# Patient Record
Sex: Male | Born: 1950 | Race: White | Hispanic: No | Marital: Married | Smoking: Never smoker
Health system: Southern US, Community
[De-identification: ages and names within clinical notes are randomized; demographics above are authoritative.]

## PROBLEM LIST (undated history)

## (undated) DIAGNOSIS — K8020 Calculus of gallbladder without cholecystitis without obstruction: Secondary | ICD-10-CM

## (undated) DIAGNOSIS — G4733 Obstructive sleep apnea (adult) (pediatric): Secondary | ICD-10-CM

## (undated) DIAGNOSIS — M1990 Unspecified osteoarthritis, unspecified site: Secondary | ICD-10-CM

## (undated) DIAGNOSIS — F419 Anxiety disorder, unspecified: Secondary | ICD-10-CM

## (undated) DIAGNOSIS — I10 Essential (primary) hypertension: Secondary | ICD-10-CM

## (undated) DIAGNOSIS — R011 Cardiac murmur, unspecified: Secondary | ICD-10-CM

## (undated) DIAGNOSIS — G4700 Insomnia, unspecified: Secondary | ICD-10-CM

## (undated) DIAGNOSIS — R519 Headache, unspecified: Secondary | ICD-10-CM

## (undated) DIAGNOSIS — F32A Depression, unspecified: Secondary | ICD-10-CM

## (undated) DIAGNOSIS — K219 Gastro-esophageal reflux disease without esophagitis: Secondary | ICD-10-CM

## (undated) DIAGNOSIS — Z9889 Other specified postprocedural states: Secondary | ICD-10-CM

## (undated) HISTORY — DX: Gastro-esophageal reflux disease without esophagitis: K21.9

## (undated) HISTORY — PX: RETINAL TEAR REPAIR CRYOTHERAPY: SHX5304

## (undated) HISTORY — DX: Unspecified osteoarthritis, unspecified site: M19.90

## (undated) HISTORY — DX: Essential (primary) hypertension: I10

## (undated) HISTORY — DX: Anxiety disorder, unspecified: F41.9

## (undated) HISTORY — DX: Cardiac murmur, unspecified: R01.1

## (undated) HISTORY — DX: Obstructive sleep apnea (adult) (pediatric): G47.33

## (undated) HISTORY — DX: Insomnia, unspecified: G47.00

## (undated) HISTORY — DX: Depression, unspecified: F32.A

---
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
## 2003-09-03 ENCOUNTER — Ambulatory Visit (HOSPITAL_BASED_OUTPATIENT_CLINIC_OR_DEPARTMENT_OTHER): Admission: RE | Admit: 2003-09-03 | Discharge: 2003-09-03 | Payer: Self-pay | Admitting: Family Medicine

## 2004-05-26 ENCOUNTER — Ambulatory Visit (HOSPITAL_BASED_OUTPATIENT_CLINIC_OR_DEPARTMENT_OTHER): Admission: RE | Admit: 2004-05-26 | Discharge: 2004-05-26 | Payer: Self-pay | Admitting: Pulmonary Disease

## 2005-12-05 ENCOUNTER — Ambulatory Visit: Payer: Self-pay | Admitting: Family Medicine

## 2005-12-12 ENCOUNTER — Ambulatory Visit: Payer: Self-pay | Admitting: Family Medicine

## 2006-08-30 ENCOUNTER — Ambulatory Visit: Payer: Self-pay | Admitting: Family Medicine

## 2006-12-18 ENCOUNTER — Ambulatory Visit: Payer: Self-pay | Admitting: Family Medicine

## 2006-12-25 ENCOUNTER — Ambulatory Visit: Payer: Self-pay | Admitting: Gastroenterology

## 2007-01-07 ENCOUNTER — Ambulatory Visit: Payer: Self-pay | Admitting: Gastroenterology

## 2007-02-11 ENCOUNTER — Ambulatory Visit: Payer: Self-pay | Admitting: Family Medicine

## 2008-02-11 ENCOUNTER — Ambulatory Visit: Payer: Self-pay | Admitting: Family Medicine

## 2008-02-18 ENCOUNTER — Ambulatory Visit: Payer: Self-pay | Admitting: Family Medicine

## 2008-02-18 DIAGNOSIS — K219 Gastro-esophageal reflux disease without esophagitis: Secondary | ICD-10-CM | POA: Insufficient documentation

## 2008-02-18 DIAGNOSIS — M129 Arthropathy, unspecified: Secondary | ICD-10-CM

## 2008-02-18 DIAGNOSIS — I10 Essential (primary) hypertension: Secondary | ICD-10-CM

## 2008-02-18 DIAGNOSIS — G4733 Obstructive sleep apnea (adult) (pediatric): Secondary | ICD-10-CM

## 2008-02-18 LAB — CONVERTED CEMR LAB
ALT: 43 units/L (ref 0–53)
AST: 28 units/L (ref 0–37)
Basophils Absolute: 0 10*3/uL (ref 0.0–0.1)
Basophils Relative: 0.2 % (ref 0.0–1.0)
Bilirubin, Direct: 0.1 mg/dL (ref 0.0–0.3)
CO2: 30 meq/L (ref 19–32)
Chloride: 105 meq/L (ref 96–112)
Cholesterol: 152 mg/dL (ref 0–200)
Creatinine, Ser: 0.9 mg/dL (ref 0.4–1.5)
Glucose, Bld: 118 mg/dL — ABNORMAL HIGH (ref 70–99)
Hemoglobin: 16.4 g/dL (ref 13.0–17.0)
LDL Cholesterol: 100 mg/dL — ABNORMAL HIGH (ref 0–99)
Lymphocytes Relative: 20.9 % (ref 12.0–46.0)
MCHC: 34.7 g/dL (ref 30.0–36.0)
Monocytes Relative: 9.7 % (ref 3.0–12.0)
Neutro Abs: 3.7 10*3/uL (ref 1.4–7.7)
Neutrophils Relative %: 67.1 % (ref 43.0–77.0)
RBC: 5.37 M/uL (ref 4.22–5.81)
Sodium: 142 meq/L (ref 135–145)
Total Bilirubin: 1 mg/dL (ref 0.3–1.2)
Total CHOL/HDL Ratio: 5.4
Total Protein: 6.7 g/dL (ref 6.0–8.3)
VLDL: 24 mg/dL (ref 0–40)
WBC: 5.4 10*3/uL (ref 4.5–10.5)

## 2008-03-30 ENCOUNTER — Ambulatory Visit: Payer: Self-pay | Admitting: Family Medicine

## 2008-03-30 DIAGNOSIS — F411 Generalized anxiety disorder: Secondary | ICD-10-CM

## 2008-05-12 ENCOUNTER — Ambulatory Visit: Payer: Self-pay | Admitting: Family Medicine

## 2008-05-26 ENCOUNTER — Telehealth (INDEPENDENT_AMBULATORY_CARE_PROVIDER_SITE_OTHER): Payer: Self-pay | Admitting: *Deleted

## 2008-06-03 ENCOUNTER — Encounter: Payer: Self-pay | Admitting: Family Medicine

## 2008-06-21 ENCOUNTER — Telehealth: Payer: Self-pay | Admitting: Family Medicine

## 2008-07-05 ENCOUNTER — Ambulatory Visit: Payer: Self-pay | Admitting: Family Medicine

## 2008-07-05 DIAGNOSIS — J1189 Influenza due to unidentified influenza virus with other manifestations: Secondary | ICD-10-CM | POA: Insufficient documentation

## 2008-07-19 ENCOUNTER — Ambulatory Visit: Payer: Self-pay | Admitting: Gastroenterology

## 2008-08-06 ENCOUNTER — Ambulatory Visit: Payer: Self-pay | Admitting: Gastroenterology

## 2008-08-06 ENCOUNTER — Encounter: Payer: Self-pay | Admitting: Gastroenterology

## 2008-08-06 LAB — HM COLONOSCOPY

## 2008-08-09 ENCOUNTER — Encounter: Payer: Self-pay | Admitting: Gastroenterology

## 2008-08-10 ENCOUNTER — Ambulatory Visit: Payer: Self-pay | Admitting: Family Medicine

## 2008-09-05 ENCOUNTER — Encounter: Payer: Self-pay | Admitting: Family Medicine

## 2008-09-07 ENCOUNTER — Encounter: Payer: Self-pay | Admitting: Family Medicine

## 2009-02-22 ENCOUNTER — Ambulatory Visit: Payer: Self-pay | Admitting: Family Medicine

## 2009-02-22 LAB — CONVERTED CEMR LAB
Blood in Urine, dipstick: NEGATIVE
Ketones, urine, test strip: NEGATIVE
Nitrite: NEGATIVE
Urobilinogen, UA: 1
WBC Urine, dipstick: NEGATIVE

## 2009-03-09 ENCOUNTER — Ambulatory Visit: Payer: Self-pay | Admitting: Family Medicine

## 2009-03-09 DIAGNOSIS — G4700 Insomnia, unspecified: Secondary | ICD-10-CM

## 2009-03-09 LAB — CONVERTED CEMR LAB
Alkaline Phosphatase: 48 units/L (ref 39–117)
Basophils Relative: 0.2 % (ref 0.0–3.0)
Bilirubin, Direct: 0 mg/dL (ref 0.0–0.3)
Calcium: 9.7 mg/dL (ref 8.4–10.5)
Creatinine, Ser: 0.9 mg/dL (ref 0.4–1.5)
Eosinophils Absolute: 0.1 10*3/uL (ref 0.0–0.7)
Eosinophils Relative: 2.7 % (ref 0.0–5.0)
GFR calc non Af Amer: 92.38 mL/min (ref >60)
HDL: 27.3 mg/dL — ABNORMAL LOW (ref >39.00)
LDL Cholesterol: 102 mg/dL — ABNORMAL HIGH (ref 0–99)
Lymphocytes Relative: 26.9 % (ref 12.0–46.0)
MCHC: 34.8 g/dL (ref 30.0–36.0)
Monocytes Relative: 11.9 % (ref 3.0–12.0)
Neutrophils Relative %: 58.3 % (ref 43.0–77.0)
RBC: 5.09 M/uL (ref 4.22–5.81)
Total CHOL/HDL Ratio: 6
Total Protein: 6.8 g/dL (ref 6.0–8.3)
Triglycerides: 149 mg/dL (ref 0.0–149.0)
VLDL: 29.8 mg/dL (ref 0.0–40.0)
WBC: 5 10*3/uL (ref 4.5–10.5)

## 2009-05-22 ENCOUNTER — Encounter: Payer: Self-pay | Admitting: Family Medicine

## 2009-05-24 ENCOUNTER — Encounter: Payer: Self-pay | Admitting: Family Medicine

## 2010-02-24 LAB — HM DIABETES EYE EXAM

## 2010-03-29 ENCOUNTER — Ambulatory Visit: Payer: Self-pay | Admitting: Family Medicine

## 2010-04-05 ENCOUNTER — Ambulatory Visit: Payer: Self-pay | Admitting: Family Medicine

## 2010-04-05 DIAGNOSIS — E119 Type 2 diabetes mellitus without complications: Secondary | ICD-10-CM

## 2010-04-05 DIAGNOSIS — T50995A Adverse effect of other drugs, medicaments and biological substances, initial encounter: Secondary | ICD-10-CM

## 2010-04-05 LAB — CONVERTED CEMR LAB
ALT: 51 units/L (ref 0–53)
AST: 31 units/L (ref 0–37)
BUN: 18 mg/dL (ref 6–23)
Bilirubin, Direct: 0.2 mg/dL (ref 0.0–0.3)
Blood in Urine, dipstick: NEGATIVE
Cholesterol: 174 mg/dL (ref 0–200)
Creatinine, Ser: 1 mg/dL (ref 0.4–1.5)
Eosinophils Relative: 2.5 % (ref 0.0–5.0)
GFR calc non Af Amer: 81.49 mL/min (ref >60)
HDL: 27.3 mg/dL — ABNORMAL LOW (ref >39.00)
Hgb A1c MFr Bld: 12.8 % — ABNORMAL HIGH (ref 4.6–6.5)
Monocytes Relative: 10 % (ref 3.0–12.0)
Neutrophils Relative %: 61.5 % (ref 43.0–77.0)
PSA: 0.4 ng/mL (ref 0.10–4.00)
Platelets: 162 10*3/uL (ref 150.0–400.0)
Protein, U semiquant: NEGATIVE
Total Bilirubin: 0.9 mg/dL (ref 0.3–1.2)
VLDL: 56.8 mg/dL — ABNORMAL HIGH (ref 0.0–40.0)
WBC Urine, dipstick: NEGATIVE
WBC: 4.6 10*3/uL (ref 4.5–10.5)
pH: 5.5

## 2010-04-06 ENCOUNTER — Telehealth: Payer: Self-pay | Admitting: Family Medicine

## 2010-04-12 ENCOUNTER — Telehealth: Payer: Self-pay | Admitting: Family Medicine

## 2010-04-18 ENCOUNTER — Telehealth: Payer: Self-pay | Admitting: Family Medicine

## 2010-04-25 ENCOUNTER — Telehealth (INDEPENDENT_AMBULATORY_CARE_PROVIDER_SITE_OTHER): Payer: Self-pay | Admitting: *Deleted

## 2010-05-17 ENCOUNTER — Encounter
Admission: RE | Admit: 2010-05-17 | Discharge: 2010-08-15 | Payer: Self-pay | Source: Home / Self Care | Attending: Family Medicine | Admitting: Family Medicine

## 2010-05-17 ENCOUNTER — Encounter: Payer: Self-pay | Admitting: Family Medicine

## 2010-06-27 ENCOUNTER — Telehealth: Payer: Self-pay | Admitting: Family Medicine

## 2010-07-12 ENCOUNTER — Encounter: Payer: Self-pay | Admitting: Family Medicine

## 2010-08-01 ENCOUNTER — Ambulatory Visit: Payer: Self-pay | Admitting: Family Medicine

## 2010-08-01 LAB — CONVERTED CEMR LAB: Hgb A1c MFr Bld: 5.8 % (ref 4.6–6.5)

## 2010-09-26 NOTE — Progress Notes (Signed)
Summary: BS readings    Phone Note Call from Patient   Caller: Spouse Summary of Call: wife called to report BS aug 17 am  aug 18 am  166 pm 176 aug 19 am 192 pm 113 aug 20 am 139 pm 111 aug 21 am 134 pm 106 aug 22 am 109 pm 109  aug 23 am 175  pls advise  Initial call taken by: Pura Spice, RN,  April 18, 2010 11:02 AM  Follow-up for Phone Call        leave on same meds cont his journal and terri will check n his appt but for now leave meds as is.  Follow-up by: Pura Spice, RN,  April 18, 2010 4:29 PM  Additional Follow-up for Phone Call Additional follow up Details #1::        [facility] Additional Follow-up by: Lynann Beaver CMA,  April 18, 2010 4:50 PM    Additional Follow-up for Phone Call Additional follow up Details #2::    Called & given info.  Appointment DTC is 9-21 per [facility].   Follow-up by: Rudy Jew, RN,  April 19, 2010 1:12 PM

## 2010-09-26 NOTE — Assessment & Plan Note (Signed)
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 With the stress of his job he continues to need alprazolam p.r.n. He is noted he has lost 12 pounds over the past 6 months without being on regular diet, also hasn't had increased thirst note polyuria last night continues to be good he had one episode of positional vertigo dizziness but only on one occasion. Other observation is had some change in his vision over the past month and has been seen bilateral mild to warm he had a beginning cataract in the right eye Colonoscopic exam dated 2014 on arrival today had patient to have had blood glucose which is after a meal and was 4.25  Allergies (verified): No Known Drug Allergies  Past History:  Past Medical History: Last updated: 07/05/2008 GERD Hypertension  Past History:  Care Management: Dermatology: Dr Bertha Stakes Gastroenterology: Dr Russella Dar Ophthalmology: Dr Adele Schilder [facility]   Review of Systems      See HPI General:  See HPI; Denies chills, fatigue, fever, loss of appetite, malaise, sleep disorder, sweats, weakness, and weight loss. Eyes:  Complains of vision loss-both eyes; 2020 with glasses 20/50, early cataract right eye. ENT:  Denies decreased hearing, difficulty swallowing, ear discharge, earache, hoarseness, nasal congestion,  nosebleeds, postnasal drainage, ringing in ears, sinus pressure, and sore throat. CV:  Denies bluish discoloration of lips or nails, chest pain or discomfort, difficulty breathing at night, difficulty breathing while lying down, fainting, fatigue, leg cramps with exertion, lightheadness, near fainting, palpitations, shortness of breath with exertion, swelling of feet, swelling of hands, and weight gain; blood pressure well controlled. Resp:  Denies chest discomfort, chest pain with inspiration, cough, coughing up blood, excessive snoring, hypersomnolence, morning headaches, pleuritic, shortness of breath, sputum productive, and wheezing. GI:  See HPI; severe GERD under control with Prilosec b.i.d.. GU:  Denies decreased libido, discharge, dysuria, erectile dysfunction, genital sores, hematuria, incontinence, nocturia, urinary frequency, and urinary hesitancy. MS:  Complains of joint pain; *pain which is controlled with diclofenac. Derm:  Denies changes in color of skin, changes in nail beds, dryness, excessive perspiration, flushing, hair loss, insect bite(s), itching, lesion(s), poor wound healing, and rash. Neuro:  Denies brief paralysis, difficulty with concentration, disturbances in coordination, falling down, headaches, inability to speak, memory loss, numbness, poor balance, seizures, sensation of room spinning, tingling, tremors, visual disturbances, and weakness. Psych:  Denies alternate hallucination ( auditory/visual), anxiety, depression, easily angered, easily tearful, irritability, mental problems, panic attacks, sense of great danger, suicidal thoughts/plans, thoughts of violence, unusual visions or sounds, and thoughts /plans of harming others.  Physical Exam  General:  Well-developed,well-nourished,in no acute distress; alert,appropriate and cooperative throughout examinationoverweight-appearing.   Head:  Normocephalic and atraumatic without obvious abnormalities. No apparent alopecia or  balding. Eyes:  No corneal or conjunctival inflammation noted. EOMI. Perrla. Funduscopic exam benign, without hemorrhages, exudates or papilledema. Vision grossly normal. Ears:  External ear exam shows no significant lesions or deformities.  Otoscopic examination reveals clear canals, tympanic membranes are intact bilaterally without bulging, retraction, inflammation or discharge. Hearing is grossly normal bilaterally. Nose:  External nasal examination shows no deformity or inflammation. Nasal mucosa are pink and moist without lesions or exudates. Mouth:  Oral mucosa and oropharynx without lesions or exudates.  Teeth in good repair. Neck:  No deformities, masses, or tenderness noted. Chest Wall:  No deformities, masses, tenderness or gynecomastia noted. Breasts:  No masses or gynecomastia noted Lungs:  Normal respiratory effort, chest expands symmetrically. Lungs are clear to auscultation, no crackles or wheezes. Heart:  Normal rate and regular rhythm. S1 and S2 normal without gallop, murmur, click, rub or other extra sounds. Abdomen:  Bowel sounds positive,abdomen soft and non-tender without masses, organomegaly or hernias noted. Rectal:  No external abnormalities noted. Normal sphincter tone. No rectal masses or tenderness. Genitalia:  Testes bilaterally descended without nodularity, tenderness or masses. No scrotal masses or lesions. No penis lesions or urethral discharge. Prostate:  Prostate gland firm and smooth, no enlargement, nodularity, tenderness, mass, asymmetry or induration. Msk:  No deformity or scoliosis noted of thoracic or lumbar spine.   Pulses:  R and L carotid,radial,femoral,dorsalis pedis and posterior tibial pulses are full and equal bilaterally Extremities:  No clubbing, cyanosis, edema, or deformity noted with normal full range of motion of all joints.   Neurologic:  No cranial nerve deficits noted. Station and gait are normal. Plantar reflexes are down-going bilaterally.  DTRs are symmetrical throughout. Sensory, motor and coordinative functions appear intact. Skin:  Intact without suspicious lesions or rashes Cervical Nodes:  No lymphadenopathy noted Axillary Nodes:  No palpable lymphadenopathy Inguinal Nodes:  No significant adenopathy Psych:  Cognition and judgment appear intact. Alert and cooperative with normal attention span and concentration. No apparent delusions, illusions, hallucinations   Impression & Recommendations:  Problem # 1:  DIABETES MELLITUS, TYPE II (ICD-250.00) Assessment New  His updated medication list for this problem includes:    Adult Aspirin Ec Low Strength 81 Mg Tbec (Aspirin) .Marland Kitchen... 1 by mouth once daily    Azor 5-20 Mg Tabs (Amlodipine-olmesartan) .Marland Kitchen... 1 once daily for blood pressure    Glucotrol 10 Mg Tabs (Glipizide) .Marland Kitchen... 1 tab am and pm already taking olemesartin and are Orders: Diabetic Clinic Referral (Diabetic)  Problem # 2:  GERD (ICD-530.81) Assessment: Improved  His updated medication list for this problem includes:    Prilosec Otc 20 Mg Tbec (Omeprazole magnesium) ..... One two times a day for gerd  Problem # 3:  ANXIETY (ICD-300.00) Assessment: Improved  His updated medication list for this problem includes:    Alprazolam 0.25 Mg Tabs (Alprazolam) .Marland Kitchen... 1 morn midafternoon and hs as needed for stress  Problem # 4:  SLEEP APNEA (ICD-780.57) Assessment: Unchanged  Problem # 5:  PHYSICAL EXAMINATION (ICD-V70.0) Assessment: Unchanged  Problem # 6:  HYPERTENSION, BENIGN ESSENTIAL (ICD-401.1) Assessment: Improved  His updated medication list for this problem includes:    Azor 5-20 Mg Tabs (Amlodipine-olmesartan) .Marland Kitchen... 1 once daily for blood pressure  Problem # 7:  ARTHRITIS (ICD-716.90) Assessment: Improved diclofenac 75 mg b.i.d.  Complete Medication List: 1)  Adult Aspirin Ec Low Strength 81 Mg Tbec (Aspirin) .Marland Kitchen.. 1 by mouth once daily 2)  Diclofenac  Sodium 75 Mg Tbec (Diclofenac sodium) .Marland Kitchen.. 1  two times a day  for arthritis 3)  Alprazolam 0.25 Mg Tabs (Alprazolam) .Marland Kitchen.. 1 morn midafternoon and hs as needed for stress 4)  Azor 5-20 Mg Tabs (Amlodipine-olmesartan) .Marland Kitchen.. 1 once daily for blood pressure 5)  Prilosec Otc 20 Mg Tbec (Omeprazole magnesium) .... One two times a day for gerd 6)  Glucotrol 10 Mg Tabs (Glipizide) .Marland Kitchen.. 1 tab am and pm 7)  Onetouch Ultra Test Strp (Glucose blood) .... As directed 8)  Onetouch Ultrasoft Lancets Misc (Lancets) .... As directed 9)  Onetouch Ultra Control Soln (Blood glucose calibration) .... As directed  Other Orders: Venipuncture (44010) Glucose, (CBG) (27253) Fingerstick (66440) Specimen Handling (34742) TLB-A1C / Hgb A1C (Glycohemoglobin) (83036-A1C)  Patient Instructions: 1)  as we discussed you are now a new diabetic2 which we will control with diet exercise as well as medications starting with glipizide 10 mg b.i.d. 2)  Check in your blood glucose each morning and keeping a record 3)  Call on 811 zero first Or blood group goes and then all me in one week to observe your improve 4)  2 schedule you your wife for diabetic classes that home hospital health services 5)  Refilled her medication Prescriptions: ONETOUCH ULTRA CONTROL  SOLN (BLOOD GLUCOSE CALIBRATION) as directed  #1 botle x 5   Entered and Authorized by:   Judithann Sheen MD   Signed by:   Judithann Sheen MD on 04/05/2010   Method used:   Electronically to        [facility]. [address] [phone]* (retail)       [address]       [place], [place]  [address]       Ph: [phone] or [phone]       Fax: [phone]   RxID:   [id number] [facility] ULTRASOFT LANCETS  MISC (LANCETS) as directed  #100 x 11   Entered and Authorized by:   Judithann Sheen MD   Signed by:   Judithann Sheen MD on 04/05/2010   Method used:   Electronically to        [facility]. [address] [phone]* (retail)       [address]       [place], [place]  [address]       Ph: [phone] or [phone]       Fax: [phone]   RxID:   [id number] [facility] ULTRA TEST  STRP (GLUCOSE BLOOD) as directed  #100 x 11   Entered and Authorized by:   Judithann Sheen MD   Signed by:   Judithann Sheen MD on 04/05/2010   Method used:   Electronically to        [facility]. [address] [phone]* (retail)       [address]       [place], [place]  [address]       Ph: [phone] or [phone]       Fax: [phone]   RxID:   [id number] GLUCOTROL 10 MG TABS (GLIPIZIDE) 1 tab AM and PM  #60 x 11   Entered and Authorized by:   Judithann Sheen MD   Signed by:   Judithann Sheen MD on 04/05/2010   Method  used:   Electronically to        [facility]. [address] [phone]* (retail)       [address]       [place], [place]  [address]       Ph: [phone] or [phone]       Fax: [phone]   RxID:   [id number] PRILOSEC OTC 20 MG TBEC (OMEPRAZOLE MAGNESIUM) one two times a day for gerd  #180 x 3   Entered and Authorized by:   Judithann Sheen MD   Signed by:   Judithann Sheen MD on 04/05/2010   Method used:   Electronically to        [facility]. [address] [phone]* (retail)       [address]       [place], [place]  [address]       Ph: [phone] or [phone]       Fax: [phone]   RxID:   [id number] AZOR 5-20 MG  TABS (AMLODIPINE-OLMESARTAN) 1 once daily for blood pressure  #30 x 11   Entered and Authorized by:   Judithann Sheen MD   Signed by:   Judithann Sheen MD on 04/05/2010   Method used:   Electronically to        [facility]. [address] [phone]* (retail)       [address]       [place], [place]  [address]       Ph: [phone] or [phone]       Fax: [phone]   RxID:   [id number] DICLOFENAC SODIUM 75 MG  TBEC (DICLOFENAC SODIUM) 1 two times a day  for arthritis  #60 x 11   Entered and  Authorized by:   Judithann Sheen MD   Signed by:   Judithann Sheen MD on 04/05/2010   Method used:   Electronically to        [facility]. [address] [phone]* (retail)       [address]       [place], [place]  [address]       Ph: [phone] or [phone]       Fax: [phone]   RxID:   [id number]   Laboratory Results   Blood Tests   Date/Time Recieved: April 05, 2010 9:10 AM  Date/Time Reported: April 05, 2010 9:10 AM   CBG Random: 497  Comments: Wynona Canes, [place]  April 05, 2010 9:11 AM

## 2010-09-26 NOTE — Letter (Signed)
Summary: [facility]  [facility]   Imported By: Maryln Gottron 07/17/2010 09:05:06  _____________________________________________________________________  External Attachment:    Type:   Image     Comment:   External Document

## 2010-09-26 NOTE — Progress Notes (Signed)
Summary: bs too high wife thinks  Phone Note Call from Patient Call back at Home Phone [phone]   Caller: vm kay Call For: gina Reason for Call: Talk to Nurse Summary of Call: Question about BS.  May need adjustment.  She doesn't think the BS are low enough.  8-10 pm 185 8-11 am 291 pm 357 8-12 am 297 pm 357 8-13 am 268 pm 235 8-14 am 286 pm 201 8-15 am 338 pm 321 8-16 am 215 pm 203 8-17 am 163.  Glipizide 10mg  one two times a day.  Working on diet appt [facility] next month.  He is not eating any desserts, probably about 7 carbs, diet sodas & water.  Working on exercise, longer walks 3x day with dog.  Is dropping weight, wife says visibly.  [facility]  NKDA. Rudy Jew, RN  April 12, 2010 3:54 PM  Initial call taken by: Rudy Jew, RN,  April 12, 2010 3:14 PM  Follow-up for Phone Call        per dr Alfonzo Feller start on metformin  wife aware and to mointor bs and call on tueday.  Follow-up by: Pura Spice, RN,  April 12, 2010 4:25 PM    New/Updated Medications: METFORMIN HCL 500 MG TABS (METFORMIN HCL) 1 by mouth two times a day Prescriptions: METFORMIN HCL 500 MG TABS (METFORMIN HCL) 1 by mouth two times a day  #60 x 5   Entered by:   Pura Spice, RN   Authorized by:   Judithann Sheen MD   Signed by:   Pura Spice, RN on 04/12/2010   Method used:   Electronically to        [facility]. [address] [phone]* (retail)       [address]       [place], [place]  [address]       Ph: [phone] or [phone]       Fax: [phone]   RxID:   [id number]

## 2010-09-26 NOTE — Letter (Signed)
Summary: [facility]  [facility]   Imported By: Maryln Gottron 05/23/2010 13:59:40  _____________________________________________________________________  External Attachment:    Type:   Image     Comment:   External Document

## 2010-09-26 NOTE — Progress Notes (Signed)
Summary: Omeprazole  Phone Note Outgoing Call   Summary of Call: I called pt to inform him that his insurance is wanting a prior authoriztion for his Omeprazole? I informed pt that he could take this over the counter two times a day. Pt states he will need a new RX wrote so he can use that on his flex spending? Initial call taken by: Josph Macho RMA,  June 27, 2010 1:39 PM  Follow-up for Phone Call        OK can have rx for Omeprazole 20mg  by mouth two times a day as needed reflux, #60 with 2 rf Follow-up by: Danise Edge MD,  June 27, 2010 1:43 PM  Additional Follow-up for Phone Call Additional follow up Details #1::        Pt informed that RX is in the front office. Additional Follow-up by: Josph Macho RMA,  June 27, 2010 1:50 PM    Prescriptions: PRILOSEC OTC 20 MG TBEC (OMEPRAZOLE MAGNESIUM) one two times a day for gerd  #60 x 2   Entered by:   Josph Macho RMA   Authorized by:   Danise Edge MD   Signed by:   Josph Macho RMA on 06/27/2010   Method used:   Print then Give to Patient   RxID:   [id number]

## 2010-09-26 NOTE — Progress Notes (Signed)
Summary: BS reading  Phone Note Call from Patient   Caller: Spouse Call For: Judithann Sheen MD Summary of Call: Wife is calling BSs FBS:  175, 134, 134, 113, 120, 117, 125, 148,  Before dinner, 84, 105, 106, 82, 154, 112, 96 [phone] Leave message. Initial call taken by: Lynann Beaver CMA,  April 25, 2010 9:02 AM  Follow-up for Phone Call        Notify reviewed previous numbers these look improved, continue the dietary and exercise changes previously made. No change in medicaitons at this time. Make sure the pm snack has a lean protein or a healthy fat with it and minimize carbs at that time and it should help the am BS somewhat. Continue with routine labs and appt. Follow-up by: Danise Edge MD,  April 25, 2010 9:07 AM  Additional Follow-up for Phone Call Additional follow up Details #1::        pts spouse informed Additional Follow-up by: Josph Macho RMA,  April 25, 2010 9:44 AM

## 2010-09-26 NOTE — Progress Notes (Signed)
Summary: Pt called and gave his fasting blood glucose reading this a.m.  Phone Note Call from Patient Call back at [facility] Phone [phone]   Caller: Patient Summary of Call: Pt called and said that his fasting blood glucose reading this morning was 185.   Pt was told to let Dr. Scotty Court know.      Initial call taken by: Lucy Antigua,  April 06, 2010 8:11 AM  Follow-up for Phone Call        DR STAFFROD AWARE THANKS Follow-up by: Pura Spice, RN,  April 06, 2010 8:30 AM

## 2010-09-28 NOTE — Assessment & Plan Note (Signed)
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 He relates he feels good has no increased urination no thirst no nocturia Blood pressure is under good control Also his arthritis is under control with the diclofenac and GERD control with his Prilosec   Current Medications (verified): 1)  Adult Aspirin Ec Low Strength 81 Mg  Tbec (Aspirin) .Marland Kitchen.. 1 By Mouth Once Daily 2)  Diclofenac Sodium 75 Mg  Tbec (Diclofenac Sodium) .Marland Kitchen.. 1 Two Times A Day  For Arthritis 3)  Alprazolam 0.25 Mg  Tabs (Alprazolam) .Marland Kitchen.. 1 Morn Midafternoon and Hs As Needed For Stress 4)  Azor 5-20 Mg  Tabs (Amlodipine-Olmesartan) .Marland Kitchen.. 1 Once Daily For Blood Pressure 5)  Prilosec Otc 20 Mg Tbec (Omeprazole Magnesium) .... One Two Times A Day For Gerd 6)  Glucotrol 10 Mg Tabs (Glipizide) .Marland Kitchen.. 1 Tab Am and Pm 7)  Onetouch Ultra Test  Strp (Glucose Blood) .... As Directed 8)  Onetouch Ultrasoft Lancets  Misc (Lancets) .... As Directed 9)  Onetouch Ultra Control  Soln (Blood Glucose Calibration) .... As Directed 10)  Metformin Hcl 500 Mg Tabs (Metformin Hcl) .Marland Kitchen.. 1 By Mouth Two Times A Day  Allergies (verified): No Known Drug Allergies  Past History:  Past Medical History: Last updated: 07/05/2008 GERD Hypertension  Past History:  Care Management: Dermatology: Dr Bertha Stakes Gastroenterology:  Dr Russella Dar Ophthalmology: Dr Adele Schilder [facility]   Review of Systems      See HPI  Physical Exam  General:  Well-developed,well-nourished,in no acute distress; alert,appropriate and cooperative throughout examination Lungs:  Normal respiratory effort, chest expands symmetrically. Lungs are clear to auscultation, no crackles or wheezes. Heart:  Normal rate and regular rhythm. S1 and S2 normal without gallop, murmur, click, rub or other extra sounds. Abdomen:  Bowel sounds positive,abdomen soft and non-tender without masses, organomegaly or hernias noted. Msk:  No deformity or scoliosis noted of thoracic or lumbar spine.   Pulses:  R and L carotid,radial,femoral,dorsalis pedis and posterior tibial pulses are full and equal bilaterally Extremities:  No clubbing, cyanosis, edema, or deformity noted with normal full range of motion of all joints.    Diabetes Management Exam:    Eye Exam:       Eye Exam done elsewhere          Date: 02/24/2010          Results: Some Changes          Done by: [facility]   Impression & Recommendations:  Problem # 1:  DIABETES MELLITUS, TYPE II (ICD-250.00) Assessment Improved  His updated medication list for this problem includes:    Adult Aspirin Ec Low Strength 81  Mg Tbec (Aspirin) .Marland Kitchen... 1 by mouth once daily    Azor 5-20 Mg Tabs (Amlodipine-olmesartan) .Marland Kitchen... 1 once daily for blood pressure    Glucotrol 10 Mg Tabs (Glipizide) .Marland Kitchen... 1 tab am and pm    Metformin Hcl 500 Mg Tabs (Metformin hcl) .Marland Kitchen... 1 by mouth two times a day  Orders: Specimen Handling (81191) Venipuncture (47829) TLB-A1C / Hgb A1C (Glycohemoglobin) (83036-A1C)  Problem # 2:  INSOMNIA (ICD-780.52) Assessment: Improved  Problem # 3:  ANXIETY (ICD-300.00) Assessment: Improved  His updated medication list for this problem includes:    Alprazolam 0.25 Mg Tabs (Alprazolam) .Marland Kitchen... 1 morn midafternoon and hs as needed for stress  Problem # 4:  GERD  (ICD-530.81) Assessment: Improved  His updated medication list for this problem includes:    Prilosec Otc 20 Mg Tbec (Omeprazole magnesium) ..... One two times a day for gerd  Problem # 5:  SLEEP APNEA (ICD-780.57) Assessment: Improved  Problem # 6:  HYPERTENSION, BENIGN ESSENTIAL (ICD-401.1) Assessment: Improved  His updated medication list for this problem includes:    Azor 5-20 Mg Tabs (Amlodipine-olmesartan) .Marland Kitchen... 1 once daily for blood pressure  Problem # 7:  ARTHRITIS (ICD-716.90) Assessment: Improved  Complete Medication List: 1)  Adult Aspirin Ec Low Strength 81 Mg Tbec (Aspirin) .Marland Kitchen.. 1 by mouth once daily 2)  Diclofenac Sodium 75 Mg Tbec (Diclofenac sodium) .Marland Kitchen.. 1 two times a day  for arthritis 3)  Alprazolam 0.25 Mg Tabs (Alprazolam) .Marland Kitchen.. 1 morn midafternoon and hs as needed for stress 4)  Azor 5-20 Mg Tabs (Amlodipine-olmesartan) .Marland Kitchen.. 1 once daily for blood pressure 5)  Prilosec Otc 20 Mg Tbec (Omeprazole magnesium) .... One two times a day for gerd 6)  Glucotrol 10 Mg Tabs (Glipizide) .Marland Kitchen.. 1 tab am and pm 7)  Onetouch Ultra Test Strp (Glucose blood) .... As directed 8)  Onetouch Ultrasoft Lancets Misc (Lancets) .... As directed 9)  Onetouch Ultra Control Soln (Blood glucose calibration) .... As directed 10)  Metformin Hcl 500 Mg Tabs (Metformin hcl) .Marland Kitchen.. 1 by mouth two times a day  Patient Instructions: 1)  according to your CBGs her diabetes will probably under good control I will call with results of the hemoglobin A1c in the near future continue to watch her caloric intake since you have not lost weight your continued and exercised for 2)  Return in 3 months for hemoglobin A1c and he will not need an office visit unless you have a problem Prescriptions: METFORMIN HCL 500 MG TABS (METFORMIN HCL) 1 by mouth two times a day  #60 x 11   Entered and Authorized by:   Judithann Sheen MD   Signed by:   Judithann Sheen MD on 08/01/2010   Method used:    Electronically to        [facility]. [address]. [phone]* (retail)       [address]       [place], [place]  [address]       Ph: [phone] or [phone]       Fax: [phone]   RxID:   [id number]    Orders Added: 1)  Specimen Handling [99000] 2)  Venipuncture [36415] 3)  TLB-A1C / Hgb A1C (Glycohemoglobin) [83036-A1C] 4)  Est. Patient Level IV [29518]

## 2010-11-22 ENCOUNTER — Other Ambulatory Visit (INDEPENDENT_AMBULATORY_CARE_PROVIDER_SITE_OTHER): Payer: BC Managed Care – PPO | Admitting: Family Medicine

## 2010-11-22 DIAGNOSIS — E119 Type 2 diabetes mellitus without complications: Secondary | ICD-10-CM

## 2010-11-22 LAB — HEMOGLOBIN A1C: Hgb A1c MFr Bld: 5.7 % (ref 4.6–6.5)

## 2010-12-08 NOTE — Progress Notes (Signed)
Called and pt did not answer; letter sent to home address regarding lab results

## 2010-12-16 ENCOUNTER — Other Ambulatory Visit: Payer: Self-pay | Admitting: Family Medicine

## 2011-01-12 NOTE — Procedures (Signed)
NAME:  Justin Herrera, Justin Herrera NO.:  [id number]   MEDICAL RECORD NO.:  [id number]          PATIENT TYPE:  OUT   LOCATION:  SLEEP CENTER                 FACILITY:  [facility]   PHYSICIAN:  Marcelyn Bruins, M.D. [facility] DATE OF BIRTH:  [birth year]   DATE OF STUDY:  05/26/2004                              NOCTURNAL POLYSOMNOGRAM   REFERRING PHYSICIAN:  Dr. Marcelyn Bruins.   INDICATION FOR THE STUDY:  Hypersomnia with sleep apnea.  The patient is  undergoing CPAP titration for pressure optimization.   SLEEP ARCHITECTURE:  The patient had a total sleep time of 376 minutes with  a significant increase in REM consistent with REM rebound.  Sleep efficiency  was 97%.  Sleep onset latency was normal and REM onset was fairly rapid.   IMPRESSION:  1.  Excellent control of previously-documented obstructive sleep apnea with      7 cm of CPAP.  The patient used his CPAP mask from home.  REM rebound      was noted.  2.  No clinically significant cardiac arrhythmias.  3.  Large numbers of leg jerks with minimal sleep disruption.      KC/MEDQ  D:  06/20/2004 14:09:22  T:  06/20/2004 15:07:06  Job:  [id number]

## 2011-03-01 ENCOUNTER — Telehealth: Payer: Self-pay | Admitting: Family Medicine

## 2011-03-01 NOTE — Telephone Encounter (Signed)
Ok to add a HgA1C; pt is aware.

## 2011-03-01 NOTE — Telephone Encounter (Signed)
Patient is schedules to have cpx labs---v70.0, on 04-03-2011. He would like to add an A1C lab. Ok to add?

## 2011-04-03 ENCOUNTER — Other Ambulatory Visit (INDEPENDENT_AMBULATORY_CARE_PROVIDER_SITE_OTHER): Payer: BC Managed Care – PPO

## 2011-04-03 DIAGNOSIS — Z0000 Encounter for general adult medical examination without abnormal findings: Secondary | ICD-10-CM

## 2011-04-03 LAB — CBC WITH DIFFERENTIAL/PLATELET
Basophils Relative: 0.7 % (ref 0.0–3.0)
Eosinophils Absolute: 0.1 10*3/uL (ref 0.0–0.7)
Eosinophils Relative: 2 % (ref 0.0–5.0)
Hemoglobin: 15.4 g/dL (ref 13.0–17.0)
Lymphocytes Relative: 23.6 % (ref 12.0–46.0)
MCHC: 34.3 g/dL (ref 30.0–36.0)
MCV: 91.2 fl (ref 78.0–100.0)
Neutro Abs: 3.2 10*3/uL (ref 1.4–7.7)
Neutrophils Relative %: 63.3 % (ref 43.0–77.0)
RBC: 4.91 Mil/uL (ref 4.22–5.81)
WBC: 5 10*3/uL (ref 4.5–10.5)

## 2011-04-03 LAB — TSH: TSH: 1.75 u[IU]/mL (ref 0.35–5.50)

## 2011-04-03 LAB — POCT URINALYSIS DIPSTICK
Ketones, UA: NEGATIVE
Leukocytes, UA: NEGATIVE
Nitrite, UA: NEGATIVE
Protein, UA: NEGATIVE
Urobilinogen, UA: 0.2

## 2011-04-03 LAB — LIPID PANEL
HDL: 32.6 mg/dL — ABNORMAL LOW (ref >39.00)
LDL Cholesterol: 112 mg/dL — ABNORMAL HIGH (ref 0–99)
Total CHOL/HDL Ratio: 5
Triglycerides: 90 mg/dL (ref 0.0–149.0)

## 2011-04-03 LAB — BASIC METABOLIC PANEL
CO2: 30 mEq/L (ref 19–32)
Chloride: 104 mEq/L (ref 96–112)
Creatinine, Ser: 1 mg/dL (ref 0.4–1.5)
Sodium: 142 mEq/L (ref 135–145)

## 2011-04-03 LAB — HEPATIC FUNCTION PANEL
Albumin: 4 g/dL (ref 3.5–5.2)
Total Bilirubin: 0.8 mg/dL (ref 0.3–1.2)

## 2011-04-03 LAB — PSA: PSA: 0.47 ng/mL (ref 0.10–4.00)

## 2011-04-04 LAB — MICROALBUMIN / CREATININE URINE RATIO
Creatinine,U: 126.6 mg/dL
Microalb Creat Ratio: 0.2 mg/g (ref 0.0–30.0)
Microalb, Ur: 0.3 mg/dL (ref 0.0–1.9)

## 2011-04-11 ENCOUNTER — Ambulatory Visit (INDEPENDENT_AMBULATORY_CARE_PROVIDER_SITE_OTHER): Payer: BC Managed Care – PPO | Admitting: Family Medicine

## 2011-04-11 ENCOUNTER — Encounter: Payer: Self-pay | Admitting: Family Medicine

## 2011-04-11 VITALS — BP 108/80 | HR 82 | Temp 98.0°F | Ht 72.0 in | Wt 231.0 lb

## 2011-04-11 DIAGNOSIS — E119 Type 2 diabetes mellitus without complications: Secondary | ICD-10-CM

## 2011-04-11 DIAGNOSIS — M129 Arthropathy, unspecified: Secondary | ICD-10-CM

## 2011-04-11 DIAGNOSIS — K219 Gastro-esophageal reflux disease without esophagitis: Secondary | ICD-10-CM

## 2011-04-11 DIAGNOSIS — Z0000 Encounter for general adult medical examination without abnormal findings: Secondary | ICD-10-CM

## 2011-04-11 DIAGNOSIS — M1990 Unspecified osteoarthritis, unspecified site: Secondary | ICD-10-CM

## 2011-04-11 DIAGNOSIS — G4700 Insomnia, unspecified: Secondary | ICD-10-CM

## 2011-04-11 DIAGNOSIS — G4730 Sleep apnea, unspecified: Secondary | ICD-10-CM

## 2011-04-11 MED ORDER — DICLOFENAC SODIUM 75 MG PO TBEC: 75.0000 mg | DELAYED_RELEASE_TABLET | Freq: Two times a day (BID) | ORAL | Status: DC

## 2011-04-11 MED ORDER — GLIPIZIDE 10 MG PO TABS: 10.0000 mg | ORAL_TABLET | Freq: Two times a day (BID) | ORAL | Status: DC

## 2011-04-11 MED ORDER — AMLODIPINE-OLMESARTAN 5-20 MG PO TABS: 1.0000 | ORAL_TABLET | Freq: Every day | ORAL | Status: DC

## 2011-04-11 MED ORDER — METFORMIN HCL 500 MG PO TABS: 500.0000 mg | ORAL_TABLET | Freq: Two times a day (BID) | ORAL | Status: DC

## 2011-04-11 NOTE — Progress Notes (Signed)
  Subjective:    Patient ID: Justin Herrera, male    DOB: [birth year], 60 y.o.   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 He relates he had been doing very well sizes daily and is to use his CPAP for sleep apnea however complains of being fatigued in the a.m. thinking he does not sleep in a low level. Relates that in the last 2 weeks he has been treated for actinic keratosis of the scalp by Dr. Para Skeans  HPI    Review of Systems  Constitutional: Negative.   HENT: Negative.   Eyes: Negative.   Respiratory: Negative.   Cardiovascular: Negative.   Gastrointestinal: Negative.   Genitourinary: Negative.   Musculoskeletal: Negative.   Skin: Negative.   Neurological: Negative.   Hematological: Negative.   Psychiatric/Behavioral: Negative.        Objective:   Physical Exam the patient is a well-developed well-nourished white male in no distress. HEENT no positive findings carotid pulses are good thyroid nonpalpable, actinic keratotic lesion been treated for Dr. Terri Piedra Lungs are clear to palpation percussion auscultation no rales no wheezing no dullness Are no cardiomegaly heart sounds are good without murmurs regular rhythm peripheral pulses are good and equal bilaterally Abdomen liver spleen kidneys are nonpalpable no masses felt no tenderness normal bowel sounds Genitalia normal testicles normal Rectal examination reveals normal prostate no other abnormalities Extremities obvious scar on the left knee medial from meniscus surgery, good peripheral pulses Neurological exam reveals normal 2+ bilateral reflexes on Romberg negative normal gait         Assessment & Plan:  Routine physical examination reveals a normal healthy male diabetic who is on a excellent control with hemoglobin A1c of 5.5 and was 12.1 one year ago GERD good control with  Prilosec OTC 20 mg daily Arthritis diclofenac 75 mg increase to one twice a day Hypertension well controlled 108/80 Immunizations up-to-date Colonioscopic  done on 08/06/2008

## 2011-04-11 NOTE — Patient Instructions (Signed)
I am very pleased with your physical examination which revealed no abnormalities also your lab studies or good hemoglobin A1c 5.5 and was 12.1 one year ago Continue medications as prescribed Attention well controlled at 108/80 Try Lunesta 2 mg at bed time for insomnia  Continue CPAP

## 2011-05-07 ENCOUNTER — Telehealth: Payer: Self-pay | Admitting: Family Medicine

## 2011-05-07 NOTE — Telephone Encounter (Signed)
Pt called and said that Dr Scotty Court gave pt some samples of Lunesta 2 mg for pt to try. Pt says that this med works well and is req a script to be called in to [facility] on [address]. Pt only have 4 pills lft. If pt could get more samples to last until script is written, that would be great. Pls call pt.

## 2011-05-09 MED ORDER — ESZOPICLONE 2 MG PO TABS: 2.0000 mg | ORAL_TABLET | Freq: Every day | ORAL | Status: DC

## 2011-05-09 NOTE — Telephone Encounter (Signed)
Ok per Dr. Scotty Court to call in Lunesta 2 mg.

## 2011-05-14 ENCOUNTER — Telehealth: Payer: Self-pay | Admitting: Family Medicine

## 2011-05-14 NOTE — Telephone Encounter (Signed)
I submitted the prior authorization for the Lunesta. It was denied because the patient has not tried at least 1 generic (zaleplon, zolpidem, etc.) The pt will need to try one of these first before any Alfonso Patten will be covered. Please submit to Rx for a generic to [facility] on [address], or advise. Thanks.

## 2011-05-17 MED ORDER — ZOLPIDEM TARTRATE 10 MG PO TABS: 10.0000 mg | ORAL_TABLET | Freq: Every evening | ORAL | Status: DC | PRN

## 2011-05-17 NOTE — Telephone Encounter (Signed)
Left a message for pt to return call 

## 2011-05-17 NOTE — Telephone Encounter (Signed)
Per Dr. Scotty Court call in Tatum 10 mg x 5 rf.

## 2011-05-22 NOTE — Telephone Encounter (Signed)
Attempted to call pt again to give information about lunesta.  Rx for Justin Herrera was called in 05/17/11

## 2011-06-09 ENCOUNTER — Other Ambulatory Visit: Payer: Self-pay | Admitting: Family Medicine

## 2011-09-04 ENCOUNTER — Ambulatory Visit: Payer: BC Managed Care – PPO | Admitting: Internal Medicine

## 2011-10-19 ENCOUNTER — Telehealth: Payer: Self-pay | Admitting: *Deleted

## 2011-10-19 NOTE — Telephone Encounter (Signed)
Received fax request saying that pt has a rx for [place]. Which sleep med is pt to be on? We denied this per Dr. Caryl Never. I left a message for pt to call us to let us know if he is going to make an appt to establish with Dr. Fabian Sharp or if he has decided to see another md.

## 2011-10-30 ENCOUNTER — Telehealth: Payer: Self-pay | Admitting: Family Medicine

## 2011-10-30 NOTE — Telephone Encounter (Signed)
Patient stated that Dr. Clent Ridges agreed to take him on as a patient during his wife's visit. Please advise.

## 2011-10-30 NOTE — Telephone Encounter (Signed)
Pt never called back.

## 2011-10-31 NOTE — Telephone Encounter (Signed)
Yes I agree to see him

## 2011-11-01 ENCOUNTER — Encounter: Payer: Self-pay | Admitting: Family Medicine

## 2011-11-01 ENCOUNTER — Ambulatory Visit (INDEPENDENT_AMBULATORY_CARE_PROVIDER_SITE_OTHER): Payer: BC Managed Care – PPO | Admitting: Family Medicine

## 2011-11-01 VITALS — BP 128/86 | HR 76 | Temp 98.4°F | Wt 237.0 lb

## 2011-11-01 DIAGNOSIS — G4700 Insomnia, unspecified: Secondary | ICD-10-CM

## 2011-11-01 DIAGNOSIS — E119 Type 2 diabetes mellitus without complications: Secondary | ICD-10-CM

## 2011-11-01 DIAGNOSIS — I10 Essential (primary) hypertension: Secondary | ICD-10-CM

## 2011-11-01 DIAGNOSIS — G4733 Obstructive sleep apnea (adult) (pediatric): Secondary | ICD-10-CM

## 2011-11-01 MED ORDER — ZOLPIDEM TARTRATE 10 MG PO TABS: 10.0000 mg | ORAL_TABLET | Freq: Every evening | ORAL | Status: DC | PRN

## 2011-11-01 MED ORDER — DICLOFENAC SODIUM 75 MG PO TBEC: 75.0000 mg | DELAYED_RELEASE_TABLET | Freq: Two times a day (BID) | ORAL | Status: DC

## 2011-11-01 MED ORDER — AMLODIPINE-OLMESARTAN 5-20 MG PO TABS: 1.0000 | ORAL_TABLET | Freq: Every day | ORAL | Status: DC

## 2011-11-01 NOTE — Progress Notes (Signed)
  Subjective:    Patient ID: Justin Herrera, male    DOB: August 26, 1951, 61 y.o.   MRN: [id number]  HPI 61 yr old male to establish with me after the retirement of Dr. Scotty Court. He is doing well with no problems. Watching his diet closely. Still wearing CPAP every night. He is pleased with how Alfonso Patten works but it is too expensive. He asks for a generic alternative.    Review of Systems  Constitutional: Negative.   Respiratory: Negative.   Cardiovascular: Negative.        Objective:   Physical Exam  Constitutional: He appears well-developed and well-nourished.  Neck: No thyromegaly present.  Cardiovascular: Normal rate, regular rhythm, normal heart sounds and intact distal pulses.   Pulmonary/Chest: Effort normal and breath sounds normal.  Lymphadenopathy:    He has no cervical adenopathy.          Assessment & Plan:  He seems to be doing well. Check an A1c today. Switch from Lunesta to Zolpidem. Refilled meds.

## 2011-11-08 NOTE — Progress Notes (Signed)
Quick Note:  Left voice message ______ 

## 2011-12-25 ENCOUNTER — Ambulatory Visit: Payer: BC Managed Care – PPO | Admitting: Family Medicine

## 2011-12-28 ENCOUNTER — Other Ambulatory Visit: Payer: Self-pay | Admitting: Family Medicine

## 2012-01-14 ENCOUNTER — Ambulatory Visit (INDEPENDENT_AMBULATORY_CARE_PROVIDER_SITE_OTHER): Payer: BC Managed Care – PPO | Admitting: Family Medicine

## 2012-01-14 ENCOUNTER — Encounter: Payer: Self-pay | Admitting: Family Medicine

## 2012-01-14 VITALS — BP 130/90 | HR 99 | Temp 98.8°F | Wt 240.0 lb

## 2012-01-14 DIAGNOSIS — M6740 Ganglion, unspecified site: Secondary | ICD-10-CM

## 2012-01-14 DIAGNOSIS — M67439 Ganglion, unspecified wrist: Secondary | ICD-10-CM

## 2012-01-14 NOTE — Progress Notes (Signed)
  Subjective:    Patient ID: Justin Herrera, male    DOB: 05/25/1951, 61 y.o.   MRN: [id number]  HPI Here for 3 weeks of numbness and burning in the right thumb and thenar eminence after a tender lump arose on the wrist. No recent trauma. Using Advil.    Review of Systems  Constitutional: Negative.   Musculoskeletal: Positive for joint swelling.  Neurological: Positive for numbness.       Objective:   Physical Exam  Constitutional: He appears well-developed and well-nourished.  Musculoskeletal:       Tender cystic structure on the flexor surface of the right wrist, full ROM. The thumb and fingers are intact           Assessment & Plan:  This is a ganglion cyst that is putting pressure on the radial nerve into the right hand. We will refer to Hand Surgery. He will apply ice for now

## 2012-03-31 ENCOUNTER — Ambulatory Visit (INDEPENDENT_AMBULATORY_CARE_PROVIDER_SITE_OTHER): Payer: BC Managed Care – PPO | Admitting: Family Medicine

## 2012-03-31 ENCOUNTER — Encounter: Payer: Self-pay | Admitting: Family Medicine

## 2012-03-31 VITALS — BP 130/90 | HR 98 | Temp 100.0°F | Wt 238.0 lb

## 2012-03-31 DIAGNOSIS — A938 Other specified arthropod-borne viral fevers: Secondary | ICD-10-CM

## 2012-03-31 MED ORDER — DOXYCYCLINE HYCLATE 100 MG PO CAPS: 100.0000 mg | ORAL_CAPSULE | Freq: Two times a day (BID) | ORAL | Status: AC

## 2012-03-31 NOTE — Progress Notes (Signed)
  Subjective:    Patient ID: Justin Herrera, male    DOB: 05-Feb-1951, 61 y.o.   MRN: [id number]  HPI Here for 3 days of aches, HAs , and a fever. He pulled a tick out of his left shoulder about 5 days ago. Has has been a little nauseated. No cough or ST. No rashes.   Review of Systems  Constitutional: Positive for fever.  Respiratory: Negative.   Cardiovascular: Negative.   Gastrointestinal: Positive for nausea. Negative for vomiting, abdominal pain, diarrhea, constipation and abdominal distention.  Genitourinary: Negative.   Musculoskeletal: Positive for myalgias.  Skin: Negative.   Neurological: Positive for headaches. Negative for dizziness, tremors, seizures, syncope, facial asymmetry, weakness, light-headedness and numbness.  Hematological: Negative.        Objective:   Physical Exam  Constitutional: He appears well-developed and well-nourished.  HENT:  Right Ear: External ear normal.  Left Ear: External ear normal.  Nose: Nose normal.  Mouth/Throat: Oropharynx is clear and moist.  Eyes: Conjunctivae are normal.  Neck: No thyromegaly present.  Cardiovascular: Normal rate, regular rhythm, normal heart sounds and intact distal pulses.   Pulmonary/Chest: Effort normal and breath sounds normal.  Lymphadenopathy:    He has no cervical adenopathy.  Skin: No rash noted.       Small red macular spot on the posterior left shoulder           Assessment & Plan:  Probable tick fever. Use Advil prn

## 2012-04-08 ENCOUNTER — Other Ambulatory Visit: Payer: BC Managed Care – PPO

## 2012-04-17 ENCOUNTER — Other Ambulatory Visit (INDEPENDENT_AMBULATORY_CARE_PROVIDER_SITE_OTHER): Payer: BC Managed Care – PPO

## 2012-04-17 DIAGNOSIS — Z0000 Encounter for general adult medical examination without abnormal findings: Secondary | ICD-10-CM

## 2012-04-17 LAB — HEPATIC FUNCTION PANEL
Albumin: 4.1 g/dL (ref 3.5–5.2)
Total Bilirubin: 1.1 mg/dL (ref 0.3–1.2)

## 2012-04-17 LAB — MICROALBUMIN / CREATININE URINE RATIO
Creatinine,U: 123.4 mg/dL
Microalb, Ur: 0.3 mg/dL (ref 0.0–1.9)

## 2012-04-17 LAB — BASIC METABOLIC PANEL
BUN: 16 mg/dL (ref 6–23)
CO2: 31 mEq/L (ref 19–32)
Calcium: 9.6 mg/dL (ref 8.4–10.5)
Glucose, Bld: 116 mg/dL — ABNORMAL HIGH (ref 70–99)
Sodium: 137 mEq/L (ref 135–145)

## 2012-04-17 LAB — CBC WITH DIFFERENTIAL/PLATELET
Basophils Absolute: 0.1 10*3/uL (ref 0.0–0.1)
Eosinophils Absolute: 0.1 10*3/uL (ref 0.0–0.7)
Hemoglobin: 14.9 g/dL (ref 13.0–17.0)
Lymphocytes Relative: 34.6 % (ref 12.0–46.0)
Lymphs Abs: 1.7 10*3/uL (ref 0.7–4.0)
MCHC: 34 g/dL (ref 30.0–36.0)
Neutro Abs: 2.6 10*3/uL (ref 1.4–7.7)
Platelets: 208 10*3/uL (ref 150.0–400.0)
RDW: 12.9 % (ref 11.5–14.6)

## 2012-04-17 LAB — POCT URINALYSIS DIPSTICK
Glucose, UA: NEGATIVE
Ketones, UA: NEGATIVE
Leukocytes, UA: NEGATIVE
Protein, UA: NEGATIVE
Urobilinogen, UA: 1

## 2012-04-17 LAB — LIPID PANEL
HDL: 31.1 mg/dL — ABNORMAL LOW (ref >39.00)
Triglycerides: 150 mg/dL — ABNORMAL HIGH (ref 0.0–149.0)

## 2012-04-17 LAB — TSH: TSH: 1.03 u[IU]/mL (ref 0.35–5.50)

## 2012-04-21 NOTE — Progress Notes (Signed)
Quick Note:  I left voice message with normal results. ______ 

## 2012-04-24 ENCOUNTER — Ambulatory Visit (INDEPENDENT_AMBULATORY_CARE_PROVIDER_SITE_OTHER): Payer: BC Managed Care – PPO | Admitting: Family Medicine

## 2012-04-24 ENCOUNTER — Encounter: Payer: Self-pay | Admitting: Family Medicine

## 2012-04-24 VITALS — BP 130/88 | HR 85 | Temp 98.5°F | Ht 71.25 in | Wt 234.0 lb

## 2012-04-24 DIAGNOSIS — Z23 Encounter for immunization: Secondary | ICD-10-CM

## 2012-04-24 DIAGNOSIS — Z0000 Encounter for general adult medical examination without abnormal findings: Secondary | ICD-10-CM

## 2012-04-24 MED ORDER — ZOLPIDEM TARTRATE 10 MG PO TABS: 10.0000 mg | ORAL_TABLET | Freq: Every evening | ORAL | Status: DC | PRN

## 2012-04-24 MED ORDER — OMEPRAZOLE 40 MG PO CPDR: 40.0000 mg | DELAYED_RELEASE_CAPSULE | Freq: Every day | ORAL | Status: DC

## 2012-04-24 MED ORDER — METFORMIN HCL 500 MG PO TABS: 500.0000 mg | ORAL_TABLET | Freq: Two times a day (BID) | ORAL | Status: DC

## 2012-04-24 MED ORDER — GLIPIZIDE 10 MG PO TABS: 10.0000 mg | ORAL_TABLET | Freq: Two times a day (BID) | ORAL | Status: DC

## 2012-04-24 NOTE — Addendum Note (Signed)
Addended by: Aniceto Boss A on: 04/24/2012 11:22 AM   Modules accepted: Orders

## 2012-04-24 NOTE — Addendum Note (Signed)
Addended by: Aniceto Boss A on: 04/24/2012 12:20 PM   Modules accepted: Orders

## 2012-04-24 NOTE — Progress Notes (Signed)
  Subjective:    Patient ID: Justin Herrera, male    DOB: 02/06/1951, 61 y.o.   MRN: [id number]  HPI 61 yr old male for a cpx. He feels fine except for some epigastric pains that come and go. These can last all day at times. They are dull achy pains that feel better after he eats something. No nausea. He has been taking OTC Omeprazole.    Review of Systems  Constitutional: Negative.   HENT: Negative.   Eyes: Negative.   Respiratory: Negative.   Cardiovascular: Negative.   Gastrointestinal: Positive for abdominal pain. Negative for nausea, vomiting, diarrhea, constipation, blood in stool, abdominal distention and rectal pain.  Genitourinary: Negative.   Musculoskeletal: Negative.   Skin: Negative.   Neurological: Negative.   Hematological: Negative.   Psychiatric/Behavioral: Negative.        Objective:   Physical Exam  Constitutional: He is oriented to person, place, and time. He appears well-developed and well-nourished. No distress.  HENT:  Head: Normocephalic and atraumatic.  Right Ear: External ear normal.  Left Ear: External ear normal.  Nose: Nose normal.  Mouth/Throat: Oropharynx is clear and moist. No oropharyngeal exudate.  Eyes: Conjunctivae and EOM are normal. Pupils are equal, round, and reactive to light. Right eye exhibits no discharge. Left eye exhibits no discharge. No scleral icterus.  Neck: Neck supple. No JVD present. No tracheal deviation present. No thyromegaly present.  Cardiovascular: Normal rate, regular rhythm, normal heart sounds and intact distal pulses.  Exam reveals no gallop and no friction rub.   No murmur heard. Pulmonary/Chest: Effort normal and breath sounds normal. No respiratory distress. He has no wheezes. He has no rales. He exhibits no tenderness.  Abdominal: Soft. Bowel sounds are normal. He exhibits no distension and no mass. There is no tenderness. There is no rebound and no guarding.  Genitourinary: Rectum normal, prostate normal and  penis normal. Guaiac negative stool. No penile tenderness.  Musculoskeletal: Normal range of motion. He exhibits no edema and no tenderness.  Lymphadenopathy:    He has no cervical adenopathy.  Neurological: He is alert and oriented to person, place, and time. He has normal reflexes. No cranial nerve deficit. He exhibits normal muscle tone. Coordination normal.  Skin: Skin is warm and dry. No rash noted. He is not diaphoretic. No erythema. No pallor.  Psychiatric: He has a normal mood and affect. His behavior is normal. Judgment and thought content normal.          Assessment & Plan:  Well exam. I think he is having some gastritis from excess stomach acid so we will increase the Omeprazole to 40 mg each morning on an empty stomach.

## 2012-07-28 ENCOUNTER — Ambulatory Visit (INDEPENDENT_AMBULATORY_CARE_PROVIDER_SITE_OTHER): Payer: BC Managed Care – PPO | Admitting: Family Medicine

## 2012-07-28 DIAGNOSIS — Z23 Encounter for immunization: Secondary | ICD-10-CM

## 2012-08-08 ENCOUNTER — Other Ambulatory Visit: Payer: Self-pay | Admitting: Family Medicine

## 2012-08-08 NOTE — Telephone Encounter (Signed)
I called in script and left voice message.

## 2012-08-08 NOTE — Telephone Encounter (Signed)
Pt need refill of omeprazole 40mg .  Pt is out, he contacted pharm, but we have no record. Pls send to [facility] [address]

## 2012-08-27 HISTORY — PX: CYSTECTOMY: SHX5119

## 2012-10-01 ENCOUNTER — Other Ambulatory Visit: Payer: Self-pay | Admitting: Family Medicine

## 2012-10-01 NOTE — Telephone Encounter (Signed)
Pt request refill of zolpidem (AMBIEN) 10 MG tablet Pharm : [facility]

## 2012-10-02 MED ORDER — ZOLPIDEM TARTRATE 10 MG PO TABS: 10.0000 mg | ORAL_TABLET | Freq: Every evening | ORAL | Status: DC | PRN

## 2012-10-02 NOTE — Telephone Encounter (Signed)
Call in #30 with 5 rf 

## 2012-10-02 NOTE — Telephone Encounter (Signed)
I called in script 

## 2012-10-15 ENCOUNTER — Ambulatory Visit: Payer: BC Managed Care – PPO | Admitting: Family Medicine

## 2012-12-08 ENCOUNTER — Telehealth: Payer: Self-pay | Admitting: Family Medicine

## 2012-12-08 DIAGNOSIS — E119 Type 2 diabetes mellitus without complications: Secondary | ICD-10-CM

## 2012-12-08 NOTE — Telephone Encounter (Signed)
I put lab order in computer, can you call and schedule the lab appointment?

## 2012-12-08 NOTE — Telephone Encounter (Signed)
PT stopped by today to schedule labs for his a1c. I explained that I would inquire to see exactly what he needs to have scheduled. Please assist.

## 2012-12-08 NOTE — Telephone Encounter (Signed)
He would need an A1c and a urine microalbumin for 250.00

## 2012-12-11 ENCOUNTER — Other Ambulatory Visit (INDEPENDENT_AMBULATORY_CARE_PROVIDER_SITE_OTHER): Payer: BC Managed Care – PPO

## 2012-12-11 ENCOUNTER — Other Ambulatory Visit: Payer: BC Managed Care – PPO

## 2012-12-11 ENCOUNTER — Ambulatory Visit: Payer: BC Managed Care – PPO | Admitting: Family Medicine

## 2012-12-11 DIAGNOSIS — E119 Type 2 diabetes mellitus without complications: Secondary | ICD-10-CM

## 2012-12-11 DIAGNOSIS — Z23 Encounter for immunization: Secondary | ICD-10-CM

## 2012-12-11 LAB — HEMOGLOBIN A1C: Hgb A1c MFr Bld: 6.1 % (ref 4.6–6.5)

## 2012-12-15 NOTE — Progress Notes (Signed)
Quick Note:  I spoke with pt ______ 

## 2013-01-24 ENCOUNTER — Other Ambulatory Visit: Payer: Self-pay | Admitting: Family Medicine

## 2013-03-02 ENCOUNTER — Encounter: Payer: Self-pay | Admitting: Family Medicine

## 2013-03-02 ENCOUNTER — Ambulatory Visit (INDEPENDENT_AMBULATORY_CARE_PROVIDER_SITE_OTHER): Payer: BC Managed Care – PPO | Admitting: Family Medicine

## 2013-03-02 VITALS — BP 146/90 | HR 93 | Temp 98.6°F | Wt 241.0 lb

## 2013-03-02 DIAGNOSIS — H8110 Benign paroxysmal vertigo, unspecified ear: Secondary | ICD-10-CM

## 2013-03-02 MED ORDER — MECLIZINE HCL 25 MG PO TABS: 25.0000 mg | ORAL_TABLET | ORAL | Status: DC | PRN

## 2013-03-02 NOTE — Progress Notes (Signed)
  Subjective:    Patient ID: Justin Herrera, male    DOB: Jan 15, 1951, 62 y.o.   MRN: [id number]  HPI Here for 5 days of intermittent dizziness which occurs when he gets up or down or turns his head quickly. He feels fine while he is still. No HA or vision changes or any other neurologic deficits.    Review of Systems  Constitutional: Negative.   HENT: Negative.   Neurological: Positive for dizziness. Negative for tremors, seizures, syncope, facial asymmetry, speech difficulty, weakness, light-headedness, numbness and headaches.       Objective:   Physical Exam  Constitutional: He is oriented to person, place, and time. He appears well-developed and well-nourished. No distress.  HENT:  Head: Normocephalic and atraumatic.  Right Ear: External ear normal.  Left Ear: External ear normal.  Nose: Nose normal.  Mouth/Throat: Oropharynx is clear and moist.  Eyes: Conjunctivae are normal. Pupils are equal, round, and reactive to light.  No nystagmus   Neck: Neck supple. No thyromegaly present.  Cardiovascular: Normal rate, regular rhythm, normal heart sounds and intact distal pulses.   Pulmonary/Chest: Effort normal and breath sounds normal.  Lymphadenopathy:    He has no cervical adenopathy.  Neurological: He is alert and oriented to person, place, and time. No cranial nerve deficit. He exhibits normal muscle tone. Coordination normal.          Assessment & Plan:  Positional vertigo. Try Claritin D daily for a week or two. Add Meclizine prn. Recheck prn

## 2013-04-30 ENCOUNTER — Other Ambulatory Visit (INDEPENDENT_AMBULATORY_CARE_PROVIDER_SITE_OTHER): Payer: BC Managed Care – PPO

## 2013-04-30 DIAGNOSIS — Z0000 Encounter for general adult medical examination without abnormal findings: Secondary | ICD-10-CM

## 2013-04-30 DIAGNOSIS — E119 Type 2 diabetes mellitus without complications: Secondary | ICD-10-CM

## 2013-04-30 LAB — CBC WITH DIFFERENTIAL/PLATELET
Basophils Absolute: 0 10*3/uL (ref 0.0–0.1)
Basophils Relative: 0.6 % (ref 0.0–3.0)
Eosinophils Absolute: 0.2 10*3/uL (ref 0.0–0.7)
HCT: 43.8 % (ref 39.0–52.0)
Hemoglobin: 15.2 g/dL (ref 13.0–17.0)
Lymphs Abs: 1.6 10*3/uL (ref 0.7–4.0)
MCHC: 34.8 g/dL (ref 30.0–36.0)
MCV: 87 fl (ref 78.0–100.0)
Monocytes Absolute: 0.6 10*3/uL (ref 0.1–1.0)
Neutro Abs: 3.6 10*3/uL (ref 1.4–7.7)
RBC: 5.03 Mil/uL (ref 4.22–5.81)
RDW: 13.1 % (ref 11.5–14.6)

## 2013-04-30 LAB — POCT URINALYSIS DIPSTICK
Bilirubin, UA: NEGATIVE
Blood, UA: NEGATIVE
Nitrite, UA: NEGATIVE
Protein, UA: NEGATIVE
pH, UA: 5.5

## 2013-04-30 LAB — LIPID PANEL
HDL: 30.9 mg/dL — ABNORMAL LOW (ref >39.00)
Total CHOL/HDL Ratio: 6
VLDL: 32.8 mg/dL (ref 0.0–40.0)

## 2013-04-30 LAB — BASIC METABOLIC PANEL
BUN: 16 mg/dL (ref 6–23)
Calcium: 9.5 mg/dL (ref 8.4–10.5)
Chloride: 102 mEq/L (ref 96–112)
Creatinine, Ser: 0.9 mg/dL (ref 0.4–1.5)
GFR: 86.61 mL/min (ref >60.00)

## 2013-04-30 LAB — MICROALBUMIN / CREATININE URINE RATIO
Microalb Creat Ratio: 0.6 mg/g (ref 0.0–30.0)
Microalb, Ur: 0.9 mg/dL (ref 0.0–1.9)

## 2013-04-30 LAB — HEPATIC FUNCTION PANEL
Alkaline Phosphatase: 44 U/L (ref 39–117)
Bilirubin, Direct: 0.1 mg/dL (ref 0.0–0.3)
Total Bilirubin: 0.8 mg/dL (ref 0.3–1.2)

## 2013-04-30 LAB — HEMOGLOBIN A1C: Hgb A1c MFr Bld: 6.4 % (ref 4.6–6.5)

## 2013-04-30 LAB — PSA: PSA: 0.44 ng/mL (ref 0.10–4.00)

## 2013-05-01 NOTE — Progress Notes (Signed)
Quick Note:  Pt has appointment on 05/08/13 will go over then. ______

## 2013-05-08 ENCOUNTER — Encounter: Payer: Self-pay | Admitting: Family Medicine

## 2013-05-08 ENCOUNTER — Ambulatory Visit (INDEPENDENT_AMBULATORY_CARE_PROVIDER_SITE_OTHER): Payer: BC Managed Care – PPO | Admitting: Family Medicine

## 2013-05-08 VITALS — BP 150/90 | HR 84 | Temp 98.1°F | Ht 70.75 in | Wt 235.0 lb

## 2013-05-08 DIAGNOSIS — Z0000 Encounter for general adult medical examination without abnormal findings: Secondary | ICD-10-CM

## 2013-05-08 DIAGNOSIS — Z23 Encounter for immunization: Secondary | ICD-10-CM

## 2013-05-08 DIAGNOSIS — Z136 Encounter for screening for cardiovascular disorders: Secondary | ICD-10-CM

## 2013-05-08 MED ORDER — DICLOFENAC SODIUM 75 MG PO TBEC: 75.0000 mg | DELAYED_RELEASE_TABLET | Freq: Two times a day (BID) | ORAL | Status: DC

## 2013-05-08 MED ORDER — ESOMEPRAZOLE MAGNESIUM 40 MG PO CPDR: 40.0000 mg | DELAYED_RELEASE_CAPSULE | Freq: Two times a day (BID) | ORAL | Status: DC

## 2013-05-08 MED ORDER — AMLODIPINE-OLMESARTAN 10-40 MG PO TABS: 1.0000 | ORAL_TABLET | Freq: Every day | ORAL | Status: DC

## 2013-05-08 MED ORDER — METFORMIN HCL 500 MG PO TABS: 500.0000 mg | ORAL_TABLET | Freq: Two times a day (BID) | ORAL | Status: DC

## 2013-05-08 MED ORDER — ZOLPIDEM TARTRATE 10 MG PO TABS: 10.0000 mg | ORAL_TABLET | Freq: Every evening | ORAL | Status: DC | PRN

## 2013-05-08 MED ORDER — GLIPIZIDE 10 MG PO TABS: 10.0000 mg | ORAL_TABLET | Freq: Two times a day (BID) | ORAL | Status: DC

## 2013-05-08 NOTE — Progress Notes (Signed)
  Subjective:    Patient ID: Justin Herrera, male    DOB: 1951-08-23, 62 y.o.   MRN: [id number]  HPI 62 yr old male for a cpx. He has a few issues to discuss. His GERD had gotten worse a month or so ago despite taking Omeprazole twice a day. He had some Nexium at home so he switched to this, and this has been very successful for his GERD. Also his BP has been high for 6 months or so.    Review of Systems  Constitutional: Negative.   HENT: Negative.   Eyes: Negative.   Respiratory: Negative.   Cardiovascular: Negative.   Gastrointestinal: Negative.   Genitourinary: Negative.   Musculoskeletal: Negative.   Skin: Negative.   Neurological: Negative.   Psychiatric/Behavioral: Negative.        Objective:   Physical Exam  Constitutional: He is oriented to person, place, and time. He appears well-developed and well-nourished. No distress.  HENT:  Head: Normocephalic and atraumatic.  Right Ear: External ear normal.  Left Ear: External ear normal.  Nose: Nose normal.  Mouth/Throat: Oropharynx is clear and moist. No oropharyngeal exudate.  Eyes: Conjunctivae and EOM are normal. Pupils are equal, round, and reactive to light. Right eye exhibits no discharge. Left eye exhibits no discharge. No scleral icterus.  Neck: Neck supple. No JVD present. No tracheal deviation present. No thyromegaly present.  Cardiovascular: Normal rate, regular rhythm, normal heart sounds and intact distal pulses.  Exam reveals no gallop and no friction rub.   No murmur heard. EKG normal  Pulmonary/Chest: Effort normal and breath sounds normal. No respiratory distress. He has no wheezes. He has no rales. He exhibits no tenderness.  Abdominal: Soft. Bowel sounds are normal. He exhibits no distension and no mass. There is no tenderness. There is no rebound and no guarding.  Genitourinary: Rectum normal, prostate normal and penis normal. Guaiac negative stool. No penile tenderness.  Musculoskeletal: Normal range  of motion. He exhibits no edema and no tenderness.  Lymphadenopathy:    He has no cervical adenopathy.  Neurological: He is alert and oriented to person, place, and time. He has normal reflexes. No cranial nerve deficit. He exhibits normal muscle tone. Coordination normal.  Skin: Skin is warm and dry. No rash noted. He is not diaphoretic. No erythema. No pallor.  Psychiatric: He has a normal mood and affect. His behavior is normal. Judgment and thought content normal.          Assessment & Plan:  Well exam. Increase Azor to 10/40 daily. Change to Nexium bid.

## 2013-05-29 ENCOUNTER — Encounter: Payer: Self-pay | Admitting: Gastroenterology

## 2013-08-28 ENCOUNTER — Telehealth: Payer: Self-pay | Admitting: Family Medicine

## 2013-08-28 MED ORDER — AZITHROMYCIN 250 MG PO TABS: ORAL_TABLET | ORAL | Status: DC

## 2013-08-28 NOTE — Telephone Encounter (Signed)
He has had 4 days of body aches, fever to 101 degrees, and a dry cough. Drinking fluids. He cannot come in today, so we will call in a Zpack for him. Recheck next week if not better

## 2013-09-21 ENCOUNTER — Encounter: Payer: Self-pay | Admitting: Gastroenterology

## 2013-11-06 ENCOUNTER — Ambulatory Visit (AMBULATORY_SURGERY_CENTER): Payer: Self-pay | Admitting: *Deleted

## 2013-11-06 ENCOUNTER — Other Ambulatory Visit: Payer: Self-pay | Admitting: Family Medicine

## 2013-11-06 VITALS — Ht 71.0 in | Wt 239.4 lb

## 2013-11-06 DIAGNOSIS — Z86010 Personal history of colonic polyps: Secondary | ICD-10-CM

## 2013-11-06 MED ORDER — MOVIPREP 100 G PO SOLR: 1.0000 | Freq: Once | ORAL | Status: DC

## 2013-11-06 NOTE — Progress Notes (Signed)
No egg or soy allergy. No anesthesia problems.  

## 2013-11-09 ENCOUNTER — Encounter: Payer: Self-pay | Admitting: Gastroenterology

## 2013-11-09 NOTE — Telephone Encounter (Signed)
Call in #60 only, he needs testing and a contract

## 2013-11-10 ENCOUNTER — Telehealth: Payer: Self-pay | Admitting: Family Medicine

## 2013-11-10 ENCOUNTER — Other Ambulatory Visit: Payer: Self-pay | Admitting: Family Medicine

## 2013-11-10 DIAGNOSIS — E119 Type 2 diabetes mellitus without complications: Secondary | ICD-10-CM

## 2013-11-10 NOTE — Telephone Encounter (Signed)
Pt called and said that he is due for a A1c and I did put this order in computer. Pt will schedule the lab appointment, give urine sample & sign control contract.

## 2013-11-11 ENCOUNTER — Encounter: Payer: Self-pay | Admitting: *Deleted

## 2013-11-12 ENCOUNTER — Other Ambulatory Visit (INDEPENDENT_AMBULATORY_CARE_PROVIDER_SITE_OTHER): Payer: BC Managed Care – PPO

## 2013-11-12 DIAGNOSIS — E119 Type 2 diabetes mellitus without complications: Secondary | ICD-10-CM

## 2013-11-12 LAB — HEMOGLOBIN A1C: HEMOGLOBIN A1C: 6.5 % (ref 4.6–6.5)

## 2013-11-20 ENCOUNTER — Ambulatory Visit (AMBULATORY_SURGERY_CENTER): Payer: BC Managed Care – PPO | Admitting: Gastroenterology

## 2013-11-20 ENCOUNTER — Encounter: Payer: Self-pay | Admitting: Gastroenterology

## 2013-11-20 VITALS — BP 126/95 | HR 55 | Temp 96.3°F | Resp 11 | Ht 71.0 in | Wt 239.0 lb

## 2013-11-20 DIAGNOSIS — D126 Benign neoplasm of colon, unspecified: Secondary | ICD-10-CM

## 2013-11-20 DIAGNOSIS — Z86010 Personal history of colonic polyps: Secondary | ICD-10-CM

## 2013-11-20 HISTORY — PX: COLONOSCOPY: SHX174

## 2013-11-20 MED ORDER — SODIUM CHLORIDE 0.9 % IV SOLN: 500.0000 mL | INTRAVENOUS | Status: DC

## 2013-11-20 NOTE — Progress Notes (Signed)
No complaints noted in the recovery room. Maw   

## 2013-11-20 NOTE — Patient Instructions (Signed)
YOU HAD AN ENDOSCOPIC PROCEDURE TODAY AT THE [facility]: Refer to the procedure report that was given to you for any specific questions about what was found during the examination.  If the procedure report does not answer your questions, please call your gastroenterologist to clarify.  If you requested that your care partner not be given the details of your procedure findings, then the procedure report has been included in a sealed envelope for you to review at your convenience later.  YOU SHOULD EXPECT: Some feelings of bloating in the abdomen. Passage of more gas than usual.  Walking can help get rid of the air that was put into your GI tract during the procedure and reduce the bloating. If you had a lower endoscopy (such as a colonoscopy or flexible sigmoidoscopy) you may notice spotting of blood in your stool or on the toilet paper. If you underwent a bowel prep for your procedure, then you may not have a normal bowel movement for a few days.  DIET: Your first meal following the procedure should be a light meal and then it is ok to progress to your normal diet.  A half-sandwich or bowl of soup is an example of a good first meal.  Heavy or fried foods are harder to digest and may make you feel nauseous or bloated.  Likewise meals heavy in dairy and vegetables can cause extra gas to form and this can also increase the bloating.  Drink plenty of fluids but you should avoid alcoholic beverages for 24 hours.  ACTIVITY: Your care partner should take you home directly after the procedure.  You should plan to take it easy, moving slowly for the rest of the day.  You can resume normal activity the day after the procedure however you should NOT DRIVE or use heavy machinery for 24 hours (because of the sedation medicines used during the test).    SYMPTOMS TO REPORT IMMEDIATELY: A gastroenterologist can be reached at any hour.  During normal business hours, 8:30 AM to 5:00 PM Monday through Friday,  call [phone].  After hours and on weekends, please call the GI answering service at [phone] who will take a message and have the physician on call contact you.   Following lower endoscopy (colonoscopy or flexible sigmoidoscopy):  Excessive amounts of blood in the stool  Significant tenderness or worsening of abdominal pains  Swelling of the abdomen that is new, acute  Fever of 100F or higher   FOLLOW UP: If any biopsies were taken you will be contacted by phone or by letter within the next 1-3 weeks.  Call your gastroenterologist if you have not heard about the biopsies in 3 weeks.  Our staff will call the home number listed on your records the next business day following your procedure to check on you and address any questions or concerns that you may have at that time regarding the information given to you following your procedure. This is a courtesy call and so if there is no answer at the home number and we have not heard from you through the emergency physician on call, we will assume that you have returned to your regular daily activities without incident.  SIGNATURES/CONFIDENTIALITY: You and/or your care partner have signed paperwork which will be entered into your electronic medical record.  These signatures attest to the fact that that the information above on your After Visit Summary has been reviewed and is understood.  Full responsibility of the confidentiality of   this discharge information lies with you and/or your care-partner.   Handouts were given to your care partner on polyps, hemorrhoids and a high fiber diet with liberal fluid intake. Await pathology results. You may resume your current medications today. Please call if any questions or concerns.

## 2013-11-20 NOTE — Op Note (Signed)
[place]  [address] [place], [id number]   COLONOSCOPY PROCEDURE REPORT  PATIENT: Justin Herrera, Justin Herrera  MR#: [id number] BIRTHDATE: [birth year] , 62  yrs. old GENDER: Male ENDOSCOPIST: Ladene Artist, MD, [facility] PROCEDURE DATE:  11/20/2013 PROCEDURE:   Colonoscopy with snare polypectomy First Screening Colonoscopy - Avg.  risk and is 50 yrs.  old or older - No.  Prior Negative Screening - Now for repeat screening. N/A  History of Adenoma - Now for follow-up colonoscopy & has been > or = to 3 yrs.  Yes hx of adenoma.  Has been 3 or more years since last colonoscopy.  Polyps Removed Today? Yes. ASA CLASS:   Class II INDICATIONS:Patient's personal history of adenomatous colon polyps.  MEDICATIONS: MAC sedation, administered by CRNA and propofol (Diprivan) 300mg  IV DESCRIPTION OF PROCEDURE:   After the risks benefits and alternatives of the procedure were thoroughly explained, informed consent was obtained.  A digital rectal exam revealed no abnormalities of the rectum.   The LB [id number]  endoscope was introduced through the anus and advanced to the cecum, which was identified by both the appendix and ileocecal valve. No adverse events experienced.   The quality of the prep was good, using MoviPrep  The instrument was then slowly withdrawn as the colon was fully examined.  COLON FINDINGS: Two sessile polyps measuring 5-6 mm in size were found in the transverse colon.  A polypectomy was performed with a cold snare. The resection was complete and the polyp tissue was completely retrieved.  The colon was otherwise normal.  There was no diverticulosis, inflammation, polyps or cancers unless previously stated. Retroflexed views revealed small internal hemorrhoids. The time to cecum=3 minutes 13 seconds. Withdrawal time=11 minutes 37 seconds. The scope was withdrawn and the procedure completed.  COMPLICATIONS: There were no complications.  ENDOSCOPIC  IMPRESSION: 1.   Two sessile polyps measuring 5-6 mm in the transverse colon; polypectomy performed with a cold snare 2.   Small internal hemorrhoids  RECOMMENDATIONS: 1.  Await pathology results 2.  Repeat Colonoscopy in 5 years.  eSigned:  Ladene Artist, MD, [facility] 11/20/2013 9:21 AM

## 2013-11-20 NOTE — Progress Notes (Signed)
Called to room to assist during endoscopic procedure.  Patient ID and intended procedure confirmed with present staff. Received instructions for my participation in the procedure from the performing physician.  

## 2013-11-20 NOTE — Progress Notes (Signed)
Stable to RR 

## 2013-11-23 ENCOUNTER — Telehealth: Payer: Self-pay | Admitting: *Deleted

## 2013-11-23 NOTE — Telephone Encounter (Signed)
  Follow up Call-  Call back number 11/20/2013  Post procedure Call Back phone  # [phone]  Permission to leave phone message Yes     Patient questions:  Do you have a fever, pain , or abdominal swelling? no Pain Score  0 *  Have you tolerated food without any problems? yes  Have you been able to return to your normal activities? yes  Do you have any questions about your discharge instructions: Diet   no Medications  no Follow up visit  no  Do you have questions or concerns about your Care? no  Actions: * If pain score is 4 or above: No action needed, pain <4.

## 2013-11-29 ENCOUNTER — Encounter: Payer: Self-pay | Admitting: Gastroenterology

## 2013-12-16 ENCOUNTER — Encounter: Payer: Self-pay | Admitting: Family Medicine

## 2014-01-06 ENCOUNTER — Encounter: Payer: Self-pay | Admitting: Pulmonary Disease

## 2014-01-14 ENCOUNTER — Telehealth: Payer: Self-pay | Admitting: Family Medicine

## 2014-01-14 MED ORDER — DOXYCYCLINE HYCLATE 100 MG PO TABS: 100.0000 mg | ORAL_TABLET | Freq: Two times a day (BID) | ORAL | Status: DC

## 2014-01-14 NOTE — Telephone Encounter (Signed)
I sent script e-scribe and left a voice message for pt. 

## 2014-01-14 NOTE — Telephone Encounter (Signed)
Call in Doxycycline 100 mg bid for 10 days  

## 2014-01-14 NOTE — Telephone Encounter (Signed)
Pt was bitten by a tick and is req an rx DOXYCYCLINE TO [place] [address]

## 2014-05-06 ENCOUNTER — Other Ambulatory Visit: Payer: Self-pay | Admitting: Family Medicine

## 2014-05-11 ENCOUNTER — Telehealth: Payer: Self-pay | Admitting: Family Medicine

## 2014-05-11 NOTE — Telephone Encounter (Signed)
Pt is sch for cpx labs on 05/27/14 and pt is dm. Pt would like A1C added to order. Can I sch?

## 2014-05-12 NOTE — Telephone Encounter (Signed)
Labs has been added

## 2014-05-12 NOTE — Telephone Encounter (Signed)
Yes please add an A1c and a urine microalbumen for 250.00

## 2014-05-13 ENCOUNTER — Other Ambulatory Visit: Payer: Self-pay | Admitting: Family Medicine

## 2014-05-13 DIAGNOSIS — E119 Type 2 diabetes mellitus without complications: Secondary | ICD-10-CM

## 2014-05-13 DIAGNOSIS — Z0000 Encounter for general adult medical examination without abnormal findings: Secondary | ICD-10-CM

## 2014-05-13 NOTE — Telephone Encounter (Signed)
I have put all future lab orders in computer. Can you add a PSA to pt's lab appointment? It's the only lab that is missing and that is because it was just added on.

## 2014-05-13 NOTE — Telephone Encounter (Signed)
done

## 2014-05-27 ENCOUNTER — Other Ambulatory Visit (INDEPENDENT_AMBULATORY_CARE_PROVIDER_SITE_OTHER): Payer: BC Managed Care – PPO

## 2014-05-27 DIAGNOSIS — Z0000 Encounter for general adult medical examination without abnormal findings: Secondary | ICD-10-CM

## 2014-05-27 LAB — POCT URINALYSIS DIPSTICK
Bilirubin, UA: NEGATIVE
Glucose, UA: NEGATIVE
KETONES UA: NEGATIVE
LEUKOCYTES UA: NEGATIVE
Nitrite, UA: NEGATIVE
PROTEIN UA: NEGATIVE
RBC UA: NEGATIVE
Spec Grav, UA: 1.015
Urobilinogen, UA: 1
pH, UA: 5.5

## 2014-05-27 LAB — LIPID PANEL
CHOL/HDL RATIO: 6
Cholesterol: 170 mg/dL (ref 0–200)
HDL: 30.6 mg/dL — AB (ref >39.00)
LDL CALC: 116 mg/dL — AB (ref 0–99)
NONHDL: 139.4
Triglycerides: 119 mg/dL (ref 0.0–149.0)
VLDL: 23.8 mg/dL (ref 0.0–40.0)

## 2014-05-27 LAB — CBC WITH DIFFERENTIAL/PLATELET
Basophils Absolute: 0 10*3/uL (ref 0.0–0.1)
Basophils Relative: 0.5 % (ref 0.0–3.0)
EOS ABS: 0.1 10*3/uL (ref 0.0–0.7)
EOS PCT: 2.5 % (ref 0.0–5.0)
HCT: 43.8 % (ref 39.0–52.0)
HEMOGLOBIN: 15.1 g/dL (ref 13.0–17.0)
LYMPHS PCT: 27.1 % (ref 12.0–46.0)
Lymphs Abs: 1.5 10*3/uL (ref 0.7–4.0)
MCHC: 34.6 g/dL (ref 30.0–36.0)
MCV: 89.1 fl (ref 78.0–100.0)
Monocytes Absolute: 0.5 10*3/uL (ref 0.1–1.0)
Monocytes Relative: 9.6 % (ref 3.0–12.0)
NEUTROS ABS: 3.3 10*3/uL (ref 1.4–7.7)
NEUTROS PCT: 60.3 % (ref 43.0–77.0)
Platelets: 233 10*3/uL (ref 150.0–400.0)
RBC: 4.92 Mil/uL (ref 4.22–5.81)
RDW: 12.9 % (ref 11.5–15.5)
WBC: 5.5 10*3/uL (ref 4.0–10.5)

## 2014-05-27 LAB — BASIC METABOLIC PANEL
BUN: 18 mg/dL (ref 6–23)
CHLORIDE: 105 meq/L (ref 96–112)
CO2: 29 mEq/L (ref 19–32)
Calcium: 9.7 mg/dL (ref 8.4–10.5)
Creatinine, Ser: 1.1 mg/dL (ref 0.4–1.5)
GFR: 75.96 mL/min (ref >60.00)
Glucose, Bld: 122 mg/dL — ABNORMAL HIGH (ref 70–99)
POTASSIUM: 5.4 meq/L — AB (ref 3.5–5.1)
Sodium: 141 mEq/L (ref 135–145)

## 2014-05-27 LAB — PSA: PSA: 0.46 ng/mL (ref 0.10–4.00)

## 2014-05-27 LAB — HEPATIC FUNCTION PANEL
ALK PHOS: 46 U/L (ref 39–117)
ALT: 56 U/L — ABNORMAL HIGH (ref 0–53)
AST: 29 U/L (ref 0–37)
Albumin: 4.3 g/dL (ref 3.5–5.2)
BILIRUBIN TOTAL: 0.8 mg/dL (ref 0.2–1.2)
Bilirubin, Direct: 0.1 mg/dL (ref 0.0–0.3)
Total Protein: 7.1 g/dL (ref 6.0–8.3)

## 2014-05-27 LAB — MICROALBUMIN / CREATININE URINE RATIO
Creatinine,U: 145.9 mg/dL
MICROALB UR: 1 mg/dL (ref 0.0–1.9)
Microalb Creat Ratio: 0.7 mg/g (ref 0.0–30.0)

## 2014-05-27 LAB — HEMOGLOBIN A1C: Hgb A1c MFr Bld: 6.2 % (ref 4.6–6.5)

## 2014-05-27 LAB — TSH: TSH: 1.76 u[IU]/mL (ref 0.35–4.50)

## 2014-06-03 ENCOUNTER — Ambulatory Visit (INDEPENDENT_AMBULATORY_CARE_PROVIDER_SITE_OTHER): Payer: BC Managed Care – PPO | Admitting: Family Medicine

## 2014-06-03 ENCOUNTER — Encounter: Payer: Self-pay | Admitting: Family Medicine

## 2014-06-03 VITALS — BP 145/92 | HR 77 | Temp 97.9°F | Ht 71.0 in | Wt 236.0 lb

## 2014-06-03 DIAGNOSIS — Z0000 Encounter for general adult medical examination without abnormal findings: Secondary | ICD-10-CM

## 2014-06-03 DIAGNOSIS — Z23 Encounter for immunization: Secondary | ICD-10-CM

## 2014-06-03 DIAGNOSIS — M654 Radial styloid tenosynovitis [de Quervain]: Secondary | ICD-10-CM | POA: Insufficient documentation

## 2014-06-03 DIAGNOSIS — Z136 Encounter for screening for cardiovascular disorders: Secondary | ICD-10-CM

## 2014-06-03 DIAGNOSIS — M722 Plantar fascial fibromatosis: Secondary | ICD-10-CM | POA: Insufficient documentation

## 2014-06-03 MED ORDER — ZOLPIDEM TARTRATE 10 MG PO TABS: ORAL_TABLET | ORAL | Status: DC

## 2014-06-03 MED ORDER — ESOMEPRAZOLE MAGNESIUM 40 MG PO CPDR: 40.0000 mg | DELAYED_RELEASE_CAPSULE | Freq: Two times a day (BID) | ORAL | Status: DC

## 2014-06-03 MED ORDER — GLIPIZIDE 10 MG PO TABS: ORAL_TABLET | ORAL | Status: DC

## 2014-06-03 MED ORDER — AMLODIPINE-OLMESARTAN 10-40 MG PO TABS: ORAL_TABLET | ORAL | Status: DC

## 2014-06-03 MED ORDER — METFORMIN HCL 500 MG PO TABS: ORAL_TABLET | ORAL | Status: DC

## 2014-06-03 MED ORDER — DICLOFENAC SODIUM 75 MG PO TBEC: 75.0000 mg | DELAYED_RELEASE_TABLET | Freq: Two times a day (BID) | ORAL | Status: DC

## 2014-06-03 MED ORDER — ESOMEPRAZOLE MAGNESIUM 20 MG PO CPDR: 40.0000 mg | DELAYED_RELEASE_CAPSULE | Freq: Two times a day (BID) | ORAL | Status: DC

## 2014-06-03 NOTE — Progress Notes (Signed)
   Subjective:    Patient ID: Justin Herrera, male    DOB: [birth year], 63 y.o.   MRN: [id number]  HPI 63 yr old male for a cpx. He has 2 complaints today. First for 2 months he has had intermittent pain in the left wrist and thumb. No hx of trauma. Also for 3 months he has intermittent pain in the bottom of the right heel.    Review of Systems  Constitutional: Negative.   HENT: Negative.   Eyes: Negative.   Respiratory: Negative.   Cardiovascular: Negative.   Gastrointestinal: Negative.   Genitourinary: Negative.   Musculoskeletal: Negative.   Skin: Negative.   Neurological: Negative.   Psychiatric/Behavioral: Negative.        Objective:   Physical Exam  Constitutional: He is oriented to person, place, and time. He appears well-developed and well-nourished. No distress.  HENT:  Head: Normocephalic and atraumatic.  Right Ear: External ear normal.  Left Ear: External ear normal.  Nose: Nose normal.  Mouth/Throat: Oropharynx is clear and moist. No oropharyngeal exudate.  Eyes: Conjunctivae and EOM are normal. Pupils are equal, round, and reactive to light. Right eye exhibits no discharge. Left eye exhibits no discharge. No scleral icterus.  Neck: Neck supple. No JVD present. No tracheal deviation present. No thyromegaly present.  Cardiovascular: Normal rate, regular rhythm, normal heart sounds and intact distal pulses.  Exam reveals no gallop and no friction rub.   No murmur heard. Pulmonary/Chest: Effort normal and breath sounds normal. No respiratory distress. He has no wheezes. He has no rales. He exhibits no tenderness.  Abdominal: Soft. Bowel sounds are normal. He exhibits no distension and no mass. There is no tenderness. There is no rebound and no guarding.  Genitourinary: Rectum normal, prostate normal and penis normal. Guaiac negative stool. No penile tenderness.  Musculoskeletal: Normal range of motion. He exhibits no edema and no tenderness.  Lymphadenopathy:   He has no cervical adenopathy.  Neurological: He is alert and oriented to person, place, and time. He has normal reflexes. No cranial nerve deficit. He exhibits normal muscle tone. Coordination normal.  Skin: Skin is warm and dry. No rash noted. He is not diaphoretic. No erythema. No pallor.  Psychiatric: He has a normal mood and affect. His behavior is normal. Judgment and thought content normal.          Assessment & Plan:  Well exam. He has tenosynovitis in the wrist and fasciitis in the foot. He can ice both areas several times a day. Use Diclofenac prn. Wear a wrist splint and use gel inserts in the shoes.

## 2014-06-03 NOTE — Progress Notes (Signed)
Pre visit review using our clinic review tool, if applicable. No additional management support is needed unless otherwise documented below in the visit note. 

## 2014-07-07 ENCOUNTER — Telehealth: Payer: Self-pay | Admitting: Family Medicine

## 2014-07-07 MED ORDER — OSELTAMIVIR PHOSPHATE 75 MG PO CAPS: 75.0000 mg | ORAL_CAPSULE | Freq: Two times a day (BID) | ORAL | Status: DC

## 2014-07-07 NOTE — Telephone Encounter (Addendum)
Pharm would like to clarify rx oseltamivir (TAMIFLU) 75 MG capsule   Pharm states rx is usually 5 days or 10 days.  But not 7 as this rx is written for.  Pharm would like a cb to clarify dosage

## 2014-07-07 NOTE — Telephone Encounter (Signed)
Per Dr. Sarajane Jews, change to bid for 5 days. I spoke with pharmacy and put a new order in computer.

## 2014-07-07 NOTE — Telephone Encounter (Signed)
His wife has influenza so we will treat him also

## 2014-08-08 ENCOUNTER — Other Ambulatory Visit: Payer: Self-pay | Admitting: Family Medicine

## 2014-08-25 ENCOUNTER — Telehealth: Payer: Self-pay | Admitting: Pulmonary Disease

## 2014-08-25 NOTE — Telephone Encounter (Signed)
Called and spoke with pt and he is needing supplies for his cpap.  He stated that he tried to buy a mask today from [facility] and they would not sell this to him without an rx from [facility]  Pt stated that he has not seen Sterling since 2006.  Pt is aware that we will send this to [facility] but he will not be back until Monday.  Pt is aware that he will need OV with [facility] for follow up.  [place] please advise if ok to send in order for pt to get supplies for cpap.  Thanks  No Known Allergies Current Outpatient Prescriptions on File Prior to Visit  Medication Sig Dispense Refill  . amLODipine-olmesartan (AZOR) 10-40 MG per tablet TAKE 1 TABLET BY MOUTH EVERY DAY 90 tablet 3  . aspirin 81 MG tablet Take 81 mg by mouth daily.      . diclofenac (VOLTAREN) 75 MG EC tablet Take 1 tablet (75 mg total) by mouth 2 (two) times daily. 180 tablet 3  . esomeprazole (NEXIUM) 20 MG capsule Take 2 capsules (40 mg total) by mouth 2 (two) times daily before a meal. 360 capsule 3  . glipiZIDE (GLUCOTROL) 10 MG tablet TAKE 1 TABLET BY MOUTH TWICE DAILY BEFORE A MEAL 180 tablet 3  . glucose blood (ONE TOUCH ULTRA TEST) test strip     . Lancets (ONETOUCH ULTRASOFT) lancets 1 each by Other route as needed. Delica    . metFORMIN (GLUCOPHAGE) 500 MG tablet TAKE 1 TABLET BY MOUTH TWICE DAILY WITH MEALS 180 tablet 3  . oseltamivir (TAMIFLU) 75 MG capsule Take 1 capsule (75 mg total) by mouth 2 (two) times daily. 10 capsule 0  . zolpidem (AMBIEN) 10 MG tablet TAKE 1 TABLET BY MOUTH EVERY NIGHT AT BEDTIME AS NEEDED FOR SLEEP 90 tablet 1  . [DISCONTINUED] eszopiclone (LUNESTA) 2 MG TABS Take 1 tablet (2 mg total) by mouth at bedtime. Take immediately before bedtime 30 tablet 5   No current facility-administered medications on file prior to visit.

## 2014-08-30 NOTE — Telephone Encounter (Signed)
Ok to send prescription specifically for new mask only. Let him know he needs ov with me soon.

## 2014-08-31 NOTE — Telephone Encounter (Signed)
Pt is aware that this has been done. ROV has been scheduled for 09/02/14 at 9:30am.

## 2014-09-02 ENCOUNTER — Ambulatory Visit (INDEPENDENT_AMBULATORY_CARE_PROVIDER_SITE_OTHER): Payer: BC Managed Care – PPO | Admitting: Pulmonary Disease

## 2014-09-02 ENCOUNTER — Encounter (INDEPENDENT_AMBULATORY_CARE_PROVIDER_SITE_OTHER): Payer: Self-pay

## 2014-09-02 ENCOUNTER — Encounter: Payer: Self-pay | Admitting: Pulmonary Disease

## 2014-09-02 VITALS — BP 162/90 | HR 69 | Temp 97.0°F | Ht 71.0 in | Wt 240.2 lb

## 2014-09-02 DIAGNOSIS — G4733 Obstructive sleep apnea (adult) (pediatric): Secondary | ICD-10-CM

## 2014-09-02 NOTE — Patient Instructions (Signed)
Continue on cpap, and work on weight reduction Will get a download off your cpap device to make sure everything is working properly, and to determine your optimal cpap pressure. Keep up with mask cushion changes and supplies.  followup with me again in one year.

## 2014-09-02 NOTE — Progress Notes (Signed)
   Subjective:    Patient ID: Justin Herrera, male    DOB: October 30, 1950, 64 y.o.   MRN: [id number]  HPI Patient comes in today for follow-up of his obstructive sleep apnea. I have not seen him since 2006, but he has been wearing his C Pap very compliantly. Overall he feels that he sleeps well with the device, and is having no significant mask issues. He is also satisfied with his daytime alertness. He is interested in purchasing a second machine to travel with, and we will need to get a download off his device. The patient states that his weight is within 3 pounds from 2006.   Review of Systems  Constitutional: Negative for fever and unexpected weight change.  HENT: Negative for congestion, dental problem, ear pain, nosebleeds, postnasal drip, rhinorrhea, sinus pressure, sneezing, sore throat and trouble swallowing.   Eyes: Negative for redness and itching.  Respiratory: Negative for cough, chest tightness, shortness of breath and wheezing.   Cardiovascular: Negative for palpitations and leg swelling.  Gastrointestinal: Negative for nausea and vomiting.  Genitourinary: Negative for dysuria.  Musculoskeletal: Negative for joint swelling.  Skin: Negative for rash.  Neurological: Negative for headaches.  Hematological: Does not bruise/bleed easily.  Psychiatric/Behavioral: Negative for dysphoric mood. The patient is not nervous/anxious.        Objective:   Physical Exam Overweight male in no acute distress Nose without purulence or discharge noted No skin breakdown or pressure necrosis from the C Pap mask Neck without lymphadenopathy or thyromegaly Lower extremities without significant edema, no cyanosis Alert and oriented, moves all 4 extremities.       Assessment & Plan:

## 2014-09-02 NOTE — Assessment & Plan Note (Signed)
The patient has been wearing C Pap compliantly, and feels that he is doing very well with his device. His current machine is about 63 years old, and he is due for some supplies. He is also interested in purchasing a second machine for travel, and we will need to get a download off his current device for pressure optimization. Will allow [place] to make sure he is doing well from an AHI standpoint. Finally, I have encouraged him to work aggressively on weight loss.

## 2014-10-11 ENCOUNTER — Telehealth: Payer: Self-pay | Admitting: Pulmonary Disease

## 2014-10-11 NOTE — Telephone Encounter (Signed)
Mindy, the download from advanced only has usage, and not any AHI data.  We need them to get this for [place], along with mask leak data if possible.  Let pt know we are working on this.

## 2014-10-12 NOTE — Telephone Encounter (Signed)
Message sent to [facility] checking on this

## 2014-10-18 NOTE — Telephone Encounter (Signed)
Pt SA escape 2 does not provide information that [facility] requested. He will not be able for new replacement unit until 09/27/15 Call Trion at [phone]

## 2014-10-18 NOTE — Telephone Encounter (Signed)
lmtcb x1 for pt. 

## 2014-10-18 NOTE — Telephone Encounter (Signed)
Let pt know that his machine is so out of date that it only allows [place] to download usage, not  Other data.  I would petition insurance to allow him to get a new machine, with the reason being that I cannot monitor benefit or do any kind of troubleshooting.

## 2014-10-18 NOTE — Telephone Encounter (Signed)
Will route message to [facility] so that he will be aware of this information.

## 2014-10-19 NOTE — Telephone Encounter (Signed)
Spoke with pt, he is aware of the below.  Should I just put a new order in for a new cpap, or is this something that we need to speak directly to Mercedes ([employer] about?

## 2014-10-19 NOTE — Telephone Encounter (Signed)
The dme has said he is not a candidate for a new machine until 08/2015! The pt will probably have to petition his insurance company for this, with the reason being that we cannot monitor his response to therapy or make adjustments to his pressure.

## 2014-10-21 NOTE — Telephone Encounter (Signed)
Sent message to [email or web address]  to see what can be done Joellen Jersey

## 2014-10-25 NOTE — Telephone Encounter (Signed)
lmtcb Sally E Ottinger ° °

## 2014-11-03 ENCOUNTER — Ambulatory Visit (INDEPENDENT_AMBULATORY_CARE_PROVIDER_SITE_OTHER): Payer: BC Managed Care – PPO | Admitting: Family Medicine

## 2014-11-03 ENCOUNTER — Encounter: Payer: Self-pay | Admitting: Family Medicine

## 2014-11-03 VITALS — BP 135/92 | HR 88 | Temp 98.8°F | Ht 71.0 in | Wt 240.0 lb

## 2014-11-03 DIAGNOSIS — J020 Streptococcal pharyngitis: Secondary | ICD-10-CM

## 2014-11-03 DIAGNOSIS — J069 Acute upper respiratory infection, unspecified: Secondary | ICD-10-CM

## 2014-11-03 LAB — POCT RAPID STREP A (OFFICE): Rapid Strep A Screen: NEGATIVE

## 2014-11-03 NOTE — Telephone Encounter (Signed)
lmtcb Sally E Ottinger ° °

## 2014-11-03 NOTE — Progress Notes (Signed)
Pre visit review using our clinic review tool, if applicable. No additional management support is needed unless otherwise documented below in the visit note. 

## 2014-11-03 NOTE — Progress Notes (Signed)
   Subjective:    Patient ID: Justin Herrera, male    DOB: 1950/10/15, 64 y.o.   MRN: [id number]  HPI Here for 10 days of stuffy head, PND, ST, and a dry cough. No fever. The cough is much improved now but the ST remains. On Mucinex.   Review of Systems  Constitutional: Negative.   HENT: Positive for congestion, postnasal drip, sore throat and voice change. Negative for sinus pressure and trouble swallowing.   Eyes: Negative.   Respiratory: Negative.        Objective:   Physical Exam  Constitutional: He appears well-developed and well-nourished.  HENT:  Right Ear: External ear normal.  Left Ear: External ear normal.  Nose: Nose normal.  Mouth/Throat: Oropharynx is clear and moist.  Eyes: Conjunctivae are normal.  Pulmonary/Chest: Effort normal and breath sounds normal.  Lymphadenopathy:    He has no cervical adenopathy.          Assessment & Plan:  This is viral and he should be close to the end of it. Drink fluids and use Chloraseptic lozenges. Recheck prn

## 2014-11-04 NOTE — Telephone Encounter (Signed)
i have left several messages for this pt but have not gotten a response will close this out and will follow up when pt does call back Justin Herrera

## 2014-11-12 ENCOUNTER — Other Ambulatory Visit: Payer: Self-pay | Admitting: Pulmonary Disease

## 2014-11-12 DIAGNOSIS — G4733 Obstructive sleep apnea (adult) (pediatric): Secondary | ICD-10-CM

## 2014-12-20 ENCOUNTER — Telehealth: Payer: Self-pay | Admitting: Family Medicine

## 2014-12-20 DIAGNOSIS — E119 Type 2 diabetes mellitus without complications: Secondary | ICD-10-CM

## 2014-12-20 NOTE — Telephone Encounter (Signed)
A1C 6.2 on 05/27/2014

## 2014-12-20 NOTE — Telephone Encounter (Signed)
Patient called requesting A1C lab. He's scheduled for 12/21/14. Can you please enter the order?

## 2014-12-20 NOTE — Telephone Encounter (Signed)
The test was ordered  

## 2014-12-21 ENCOUNTER — Other Ambulatory Visit (INDEPENDENT_AMBULATORY_CARE_PROVIDER_SITE_OTHER): Payer: BC Managed Care – PPO

## 2014-12-21 DIAGNOSIS — E119 Type 2 diabetes mellitus without complications: Secondary | ICD-10-CM | POA: Diagnosis not present

## 2014-12-21 LAB — HEMOGLOBIN A1C: HEMOGLOBIN A1C: 6.5 % (ref 4.6–6.5)

## 2014-12-23 ENCOUNTER — Telehealth: Payer: Self-pay | Admitting: Family Medicine

## 2014-12-23 NOTE — Telephone Encounter (Signed)
Pt wife said he was bitten by a tick. Wife was calling to say that Dr Sarajane Jews had told them if they ever were bitten by a tick again to call and he would give them a rx.. She is calling for a rx .      Pharmacy [facility] [address]

## 2014-12-24 MED ORDER — DOXYCYCLINE HYCLATE 100 MG PO TABS: 100.0000 mg | ORAL_TABLET | Freq: Two times a day (BID) | ORAL | Status: DC

## 2014-12-24 NOTE — Telephone Encounter (Signed)
I sent script e-scribe and spoke with pt. 

## 2014-12-24 NOTE — Telephone Encounter (Signed)
Pt wife called to asked if something was going to be called in and did let her know yes.

## 2014-12-24 NOTE — Telephone Encounter (Signed)
Call in Doxycycline 100 mg bid for 7 days

## 2015-01-03 LAB — HM DIABETES EYE EXAM

## 2015-01-18 ENCOUNTER — Telehealth: Payer: Self-pay | Admitting: Pulmonary Disease

## 2015-01-18 NOTE — Telephone Encounter (Signed)
lmomtcb x1 for pt 

## 2015-01-18 NOTE — Telephone Encounter (Signed)
Justin Herrera, we were supposed to get a download off the pt's machine so that we could find optimal pressure for his travel machine.  The 4 week download from march to end of April only shows 2 days of usage?? What gives? Please find out what is going on.

## 2015-01-19 NOTE — Telephone Encounter (Signed)
lmomtcb x 2  

## 2015-01-20 NOTE — Telephone Encounter (Signed)
lmomtcb x 3  

## 2015-01-21 NOTE — Telephone Encounter (Signed)
The download only has compliance data.  Need everything, including pressure data.  Thanks.  Would have him f/u with Dr. Halford Chessman.

## 2015-01-21 NOTE — Telephone Encounter (Signed)
lmtcb for pt.  

## 2015-01-21 NOTE — Telephone Encounter (Signed)
Let him know that his download looks perfect for last 30 days. His optimal FIXED pressure appears to be 11cm.  If he is going to get travel cpap device, would put on 11cm.  Ok to send order

## 2015-01-21 NOTE — Telephone Encounter (Signed)
[phone], pt cb

## 2015-01-21 NOTE — Telephone Encounter (Signed)
Called and spoke to pt. Informed pt of the recs per [facility]. Pt verbalized understanding. Pt requesting to see VS in 08/2015. Recall placed.   [place] please advise on new download.

## 2015-01-21 NOTE — Telephone Encounter (Signed)
Spoke with pt - [place] 1 [place] d/t a terminal illness/death in his family.  Since the end of April to present, he has been using it nightly.  4 wk download printed from [employer] and given to [id number] to view.  Dr. Gwenette Greet, pt would also like to your recs on which Dr. He should switch to for his OSA follow ups.

## 2015-02-07 NOTE — Telephone Encounter (Signed)
Pt needing a 1 year follow up with a sleep physician - recall put in system for follow up (consult) with VS in jan 2017.  Pt states that he does not need anything at this time and will call if anything comes up. Nothing further needed.

## 2015-03-09 ENCOUNTER — Encounter: Payer: Self-pay | Admitting: Gastroenterology

## 2015-06-02 ENCOUNTER — Telehealth: Payer: Self-pay | Admitting: Family Medicine

## 2015-06-02 NOTE — Telephone Encounter (Signed)
Pt last a1c was in April 2016. Pt is sch for cpx labs in nov. Can I sch another a1c w/cpx labs?

## 2015-06-03 NOTE — Telephone Encounter (Signed)
Yes please add an A1c

## 2015-06-08 NOTE — Telephone Encounter (Signed)
Pt is aware.  

## 2015-06-28 ENCOUNTER — Other Ambulatory Visit (INDEPENDENT_AMBULATORY_CARE_PROVIDER_SITE_OTHER): Payer: BC Managed Care – PPO

## 2015-06-28 DIAGNOSIS — Z0000 Encounter for general adult medical examination without abnormal findings: Secondary | ICD-10-CM

## 2015-06-28 LAB — TSH: TSH: 1.81 u[IU]/mL (ref 0.35–4.50)

## 2015-06-28 LAB — BASIC METABOLIC PANEL
BUN: 18 mg/dL (ref 6–23)
CHLORIDE: 102 meq/L (ref 96–112)
CO2: 31 mEq/L (ref 19–32)
Calcium: 9.9 mg/dL (ref 8.4–10.5)
Creatinine, Ser: 1.12 mg/dL (ref 0.40–1.50)
GFR: 70.26 mL/min (ref >60.00)
Glucose, Bld: 138 mg/dL — ABNORMAL HIGH (ref 70–99)
POTASSIUM: 5 meq/L (ref 3.5–5.1)
SODIUM: 141 meq/L (ref 135–145)

## 2015-06-28 LAB — HEMOGLOBIN A1C: HEMOGLOBIN A1C: 6.8 % — AB (ref 4.6–6.5)

## 2015-06-28 LAB — HEPATIC FUNCTION PANEL
ALT: 50 U/L (ref 0–53)
AST: 27 U/L (ref 0–37)
Albumin: 4.4 g/dL (ref 3.5–5.2)
Alkaline Phosphatase: 53 U/L (ref 39–117)
BILIRUBIN DIRECT: 0.2 mg/dL (ref 0.0–0.3)
BILIRUBIN TOTAL: 0.7 mg/dL (ref 0.2–1.2)
TOTAL PROTEIN: 6.9 g/dL (ref 6.0–8.3)

## 2015-06-28 LAB — POCT URINALYSIS DIPSTICK
BILIRUBIN UA: NEGATIVE
GLUCOSE UA: NEGATIVE
Ketones, UA: NEGATIVE
Leukocytes, UA: NEGATIVE
NITRITE UA: NEGATIVE
Protein, UA: NEGATIVE
RBC UA: NEGATIVE
Spec Grav, UA: >=1.03
UROBILINOGEN UA: 0.2
pH, UA: 5.5

## 2015-06-28 LAB — CBC WITH DIFFERENTIAL/PLATELET
BASOS PCT: 0.5 % (ref 0.0–3.0)
Basophils Absolute: 0 10*3/uL (ref 0.0–0.1)
EOS PCT: 2.9 % (ref 0.0–5.0)
Eosinophils Absolute: 0.2 10*3/uL (ref 0.0–0.7)
HEMATOCRIT: 45.5 % (ref 39.0–52.0)
HEMOGLOBIN: 15.5 g/dL (ref 13.0–17.0)
LYMPHS PCT: 25.7 % (ref 12.0–46.0)
Lymphs Abs: 1.7 10*3/uL (ref 0.7–4.0)
MCHC: 34.2 g/dL (ref 30.0–36.0)
MCV: 88.5 fl (ref 78.0–100.0)
Monocytes Absolute: 0.6 10*3/uL (ref 0.1–1.0)
Monocytes Relative: 9.6 % (ref 3.0–12.0)
Neutro Abs: 4.1 10*3/uL (ref 1.4–7.7)
Neutrophils Relative %: 61.3 % (ref 43.0–77.0)
Platelets: 222 10*3/uL (ref 150.0–400.0)
RBC: 5.14 Mil/uL (ref 4.22–5.81)
RDW: 13.1 % (ref 11.5–15.5)
WBC: 6.7 10*3/uL (ref 4.0–10.5)

## 2015-06-28 LAB — LIPID PANEL
CHOL/HDL RATIO: 5
CHOLESTEROL: 167 mg/dL (ref 0–200)
HDL: 32.1 mg/dL — ABNORMAL LOW (ref >39.00)
LDL CALC: 95 mg/dL (ref 0–99)
NonHDL: 134.72
TRIGLYCERIDES: 199 mg/dL — AB (ref 0.0–149.0)
VLDL: 39.8 mg/dL (ref 0.0–40.0)

## 2015-06-28 LAB — PSA: PSA: 0.65 ng/mL (ref 0.10–4.00)

## 2015-07-05 ENCOUNTER — Ambulatory Visit (INDEPENDENT_AMBULATORY_CARE_PROVIDER_SITE_OTHER): Payer: BC Managed Care – PPO | Admitting: Family Medicine

## 2015-07-05 ENCOUNTER — Encounter: Payer: Self-pay | Admitting: Family Medicine

## 2015-07-05 VITALS — BP 134/88 | HR 84 | Temp 98.1°F | Ht 71.0 in | Wt 241.0 lb

## 2015-07-05 DIAGNOSIS — Z23 Encounter for immunization: Secondary | ICD-10-CM

## 2015-07-05 DIAGNOSIS — M67432 Ganglion, left wrist: Secondary | ICD-10-CM

## 2015-07-05 DIAGNOSIS — Z0000 Encounter for general adult medical examination without abnormal findings: Secondary | ICD-10-CM

## 2015-07-05 DIAGNOSIS — R0602 Shortness of breath: Secondary | ICD-10-CM | POA: Diagnosis not present

## 2015-07-05 MED ORDER — ALPRAZOLAM 0.5 MG PO TABS: 0.5000 mg | ORAL_TABLET | Freq: Every evening | ORAL | Status: DC | PRN

## 2015-07-05 NOTE — Progress Notes (Signed)
Pre visit review using our clinic review tool, if applicable. No additional management support is needed unless otherwise documented below in the visit note. 

## 2015-07-06 NOTE — Progress Notes (Signed)
Subjective:    Patient ID: Justin Herrera, male    DOB: 1950/12/04, 64 y.o.   MRN: [id number]  HPI 64 yr old male for a cpx. He has some issues to discuss. First he as been dealing with some anxiety lately over a number of things. This makes it hard to relax at night and often makes it hard to go to sleep. Second e has noticed some SOB when he exerts himself and some mild chest pressure. No chest pain per se. No nausea or sweats. Third he has a painful lump in the left wrist that reminds him of the ganglion cyst he had removed from the right wrist several years ago. Fourth he has frequent bouts of tiny tender pustules on the legs that last about a week and then go away. These are more frequent in hot weather.    Review of Systems  Constitutional: Negative.   HENT: Negative.   Eyes: Negative.   Respiratory: Positive for shortness of breath. Negative for apnea, cough, choking, chest tightness, wheezing and stridor.   Cardiovascular: Negative.   Gastrointestinal: Negative.   Endocrine: Negative.   Genitourinary: Negative.   Musculoskeletal: Positive for joint swelling and arthralgias. Negative for myalgias, back pain, gait problem, neck pain and neck stiffness.  Skin: Positive for rash. Negative for color change, pallor and wound.  Neurological: Negative.   Hematological: Negative.   Psychiatric/Behavioral: Positive for sleep disturbance. Negative for hallucinations, behavioral problems, confusion, dysphoric mood, decreased concentration and agitation. The patient is nervous/anxious.        Objective:   Physical Exam  Constitutional: He is oriented to person, place, and time. He appears well-developed and well-nourished. No distress.  HENT:  Head: Normocephalic and atraumatic.  Right Ear: External ear normal.  Left Ear: External ear normal.  Nose: Nose normal.  Mouth/Throat: Oropharynx is clear and moist. No oropharyngeal exudate.  Eyes: Conjunctivae and EOM are normal. Pupils  are equal, round, and reactive to light. Right eye exhibits no discharge. Left eye exhibits no discharge. No scleral icterus.  Neck: Neck supple. No JVD present. No tracheal deviation present. No thyromegaly present.  Cardiovascular: Normal rate, regular rhythm, normal heart sounds and intact distal pulses.  Exam reveals no gallop and no friction rub.   No murmur heard. EKG normal   Pulmonary/Chest: Effort normal and breath sounds normal. No respiratory distress. He has no wheezes. He has no rales. He exhibits no tenderness.  Abdominal: Soft. Bowel sounds are normal. He exhibits no distension and no mass. There is no tenderness. There is no rebound and no guarding.  Genitourinary: Rectum normal, prostate normal and penis normal. Guaiac negative stool. No penile tenderness.  Musculoskeletal: Normal range of motion. He exhibits no edema.  Left wrist has a small tender ganglion cyst   Lymphadenopathy:    He has no cervical adenopathy.  Neurological: He is alert and oriented to person, place, and time. He has normal reflexes. No cranial nerve deficit. He exhibits normal muscle tone. Coordination normal.  Skin: Skin is warm and dry. No rash noted. He is not diaphoretic. No erythema. No pallor.  Psychiatric: He has a normal mood and affect. His behavior is normal. Judgment and thought content normal.          Assessment & Plan:  Well exam. We discussed diet and exercise. For the SOB on exertion, we need to rule out cardiac ischemia since he is diabetic. Set up a tress test soon. Refer to Hand Surgery for the  wrist cyst. He has intermittent folliculitis on the legs so I suggested he wash them daily with an antibacterial agent like Phisohex. Try Xanax at bedtime for sleep.

## 2015-07-13 ENCOUNTER — Telehealth (HOSPITAL_COMMUNITY): Payer: Self-pay

## 2015-07-13 NOTE — Telephone Encounter (Signed)
Left message with Myrla Halsted DPR in reference to upcoming appointment scheduled on 07-15-2015 at 0715 with detailed instructions given per Myocardial Perfusion Study Information Sheet for the test. LM to arrive 15 minutes early, and that it is imperative to arrive on time for appointment to keep from having the test rescheduled. If you need to cancel or reschedule your appointment, please call the office within 24 hours of your appointment. Failure to do so may result in a cancellation of your appointment, and a $50 no show fee. Phone number given for call back for any questions. Oletta Lamas, Ailea Rhatigan A

## 2015-07-15 ENCOUNTER — Ambulatory Visit (HOSPITAL_COMMUNITY): Payer: BC Managed Care – PPO | Attending: Cardiology

## 2015-07-15 DIAGNOSIS — E119 Type 2 diabetes mellitus without complications: Secondary | ICD-10-CM | POA: Diagnosis not present

## 2015-07-15 DIAGNOSIS — R0609 Other forms of dyspnea: Secondary | ICD-10-CM | POA: Diagnosis not present

## 2015-07-15 DIAGNOSIS — R0602 Shortness of breath: Secondary | ICD-10-CM | POA: Insufficient documentation

## 2015-07-15 DIAGNOSIS — R9439 Abnormal result of other cardiovascular function study: Secondary | ICD-10-CM | POA: Diagnosis not present

## 2015-07-15 LAB — MYOCARDIAL PERFUSION IMAGING
CHL CUP MPHR: 157 {beats}/min
CHL CUP NUCLEAR SRS: 11
CHL CUP NUCLEAR SSS: 18
CSEPED: 8 min
Estimated workload: 10.1 METS
Exercise duration (sec): 0 s
LHR: 0.3
LV dias vol: 121 mL
LV sys vol: 64 mL
NUC STRESS TID: 1.11
Peak HR: 137 {beats}/min
Percent HR: 87 %
Rest HR: 70 {beats}/min
SDS: 7

## 2015-07-15 MED ORDER — TECHNETIUM TC 99M SESTAMIBI GENERIC - CARDIOLITE
10.7000 | Freq: Once | INTRAVENOUS | Status: AC | PRN
  Administered 2015-07-15: 11 via INTRAVENOUS

## 2015-07-15 MED ORDER — TECHNETIUM TC 99M SESTAMIBI GENERIC - CARDIOLITE
32.5000 | Freq: Once | INTRAVENOUS | Status: AC | PRN
  Administered 2015-07-15: 33 via INTRAVENOUS

## 2015-07-18 ENCOUNTER — Telehealth: Payer: Self-pay | Admitting: Family Medicine

## 2015-07-18 NOTE — Addendum Note (Signed)
Addended by: Alysia Penna A on: 07/18/2015 01:17 PM   Modules accepted: Orders

## 2015-07-18 NOTE — Telephone Encounter (Signed)
Patient came in office, stated that he was here almost 2 weeks ago.  He was needing refills on his medications but they have not been called in.  Medications needed (some are generics, whatever is on file from before):  Azor Voltaren Nexium Glipizide Metformin Test strips Lancets  Use same [facility] on the [address] of [address] that is on file.

## 2015-07-20 MED ORDER — DICLOFENAC SODIUM 75 MG PO TBEC: 75.0000 mg | DELAYED_RELEASE_TABLET | Freq: Two times a day (BID) | ORAL | Status: DC

## 2015-07-20 MED ORDER — ONETOUCH ULTRASOFT LANCETS MISC: Status: AC

## 2015-07-20 MED ORDER — GLUCOSE BLOOD VI STRP: ORAL_STRIP | Status: AC

## 2015-07-20 MED ORDER — AMLODIPINE-OLMESARTAN 10-40 MG PO TABS: ORAL_TABLET | ORAL | Status: DC

## 2015-07-20 MED ORDER — METFORMIN HCL 500 MG PO TABS: ORAL_TABLET | ORAL | Status: DC

## 2015-07-20 MED ORDER — ESOMEPRAZOLE MAGNESIUM 20 MG PO CPDR: 40.0000 mg | DELAYED_RELEASE_CAPSULE | Freq: Two times a day (BID) | ORAL | Status: DC

## 2015-07-20 NOTE — Telephone Encounter (Signed)
Rx was already sent to the pharmacy.

## 2015-07-25 ENCOUNTER — Other Ambulatory Visit: Payer: Self-pay | Admitting: Family Medicine

## 2015-08-03 ENCOUNTER — Encounter: Payer: Self-pay | Admitting: Cardiology

## 2015-08-03 ENCOUNTER — Ambulatory Visit (INDEPENDENT_AMBULATORY_CARE_PROVIDER_SITE_OTHER): Payer: BC Managed Care – PPO | Admitting: Cardiology

## 2015-08-03 VITALS — BP 142/94 | HR 93 | Ht 71.0 in | Wt 239.3 lb

## 2015-08-03 DIAGNOSIS — R9439 Abnormal result of other cardiovascular function study: Secondary | ICD-10-CM | POA: Diagnosis not present

## 2015-08-03 NOTE — Patient Instructions (Signed)
Your physician wants you to follow-up in: 4 Months. You will receive a reminder letter in the mail two months in advance. If you don't receive a letter, please call our office to schedule the follow-up appointment.  Your physician has requested that you have an echocardiogram. Echocardiography is a painless test that uses sound waves to create images of your heart. It provides your doctor with information about the size and shape of your heart and how well your heart's chambers and valves are working. This procedure takes approximately one hour. There are no restrictions for this procedure.  Merry Christmas and Happy New Year!!

## 2015-08-03 NOTE — Progress Notes (Signed)
Cardiology Office Note   Date:  08/03/2015   ID:  ILAI WARK, DOB March 30, 1951, MRN [id number]  PCP:  Laurey Morale, MD  Cardiologist:   Minus Breeding, MD   Chief Complaint  Patient presents with  . Abnormal Stress Test      History of Present Illness: Justin Herrera is a 64 y.o. male who presents for evaluation of an abnormal stress test. The patient reports that over several months he had some decreased energy and if he was doing some very heavy exercise such as carrying a 50 pound bag of deer food he might get short of breath. He was not really describing chest pressure, neck or arm discomfort. He wasn't having palpitations, presyncope or syncope. However, he has significant cardiovascular risk factors. He was sent for a stress perfusion study which suggested that his EF was perhaps about 48%. There was a medium defect of moderate severity in the basal inferior, basal inferolateral and inferolateral location with question peri-infarct ischemia. The patient was able to walk 8 minutes and it took a while to get his heart rate above 85% on the stress test. He said he didn't have any symptoms. In fact he walked 4 1/2 flights  of stairs recently and says he was somewhat short of breath but could go on and finish the next4 1/2 flights to the top of the [place].     Past Medical History  Diagnosis Date  . GERD (gastroesophageal reflux disease)   . Hypertension   . Diabetes mellitus   . Insomnia   . Sleep apnea, obstructive     wears a CPAP  . Anxiety   . Arthritis     Past Surgical History  Procedure Laterality Date  . Colonoscopy  11-20-13    per Dr. Fuller Plan, adenomatous polyps, repeat in 5 yrs   . Knee surgery  1968  . Cystectomy Right 2014    right wrist     Current Outpatient Prescriptions  Medication Sig Dispense Refill  . ALPRAZolam (XANAX) 0.5 MG tablet Take 1 tablet (0.5 mg total) by mouth at bedtime as needed for anxiety. 30 tablet 5  .  amLODipine-olmesartan (AZOR) 10-40 MG tablet TAKE 1 TABLET BY MOUTH EVERY DAY 90 tablet 3  . aspirin 81 MG tablet Take 81 mg by mouth daily.      . diclofenac (VOLTAREN) 75 MG EC tablet Take 1 tablet (75 mg total) by mouth 2 (two) times daily. 180 tablet 3  . esomeprazole (NEXIUM) 20 MG capsule Take 2 capsules (40 mg total) by mouth 2 (two) times daily before a meal. 360 capsule 3  . glipiZIDE (GLUCOTROL) 10 MG tablet TAKE 1 TABLET BY MOUTH TWICE DAILY BEFORE A MEAL 180 tablet 2  . glucose blood (ONE TOUCH ULTRA TEST) test strip Once a day 100 each 2  . Lancets (ONETOUCH ULTRASOFT) lancets Once a day 100 each 2  . metFORMIN (GLUCOPHAGE) 500 MG tablet TAKE 1 TABLET BY MOUTH TWICE DAILY WITH MEALS 180 tablet 3  . [DISCONTINUED] eszopiclone (LUNESTA) 2 MG TABS Take 1 tablet (2 mg total) by mouth at bedtime. Take immediately before bedtime 30 tablet 5   No current facility-administered medications for this visit.    Allergies:   Review of patient's allergies indicates no known allergies.    Social History:  The patient  reports that he has never smoked. He has never used smokeless tobacco. He reports that he does not drink alcohol or use illicit  drugs.   Family History:  The patient's family history includes Heart attack (age of onset: 65) in his maternal grandfather. There is no history of Colon cancer or Stomach cancer.    ROS:  Please see the history of present illness.   Otherwise, review of systems are positive for none.   All other systems are reviewed and negative.    PHYSICAL EXAM: VS:  BP 142/94 mmHg  Pulse 93  Ht 5\' 11"  (1.803 m)  Wt 239 lb 4.8 oz (108.546 kg)  BMI 33.39 kg/m2 , BMI Body mass index is 33.39 kg/(m^2). GENERAL:  Well appearing HEENT:  Pupils equal round and reactive, fundi not visualized, oral mucosa unremarkable NECK:  No jugular venous distention, waveform within normal limits, carotid upstroke brisk and symmetric, no bruits, no thyromegaly LYMPHATICS:  No  cervical, inguinal adenopathy LUNGS:  Clear to auscultation bilaterally BACK:  No CVA tenderness CHEST:  Unremarkable HEART:  PMI not displaced or sustained,S1 and S2 within normal limits, no S3, no S4, no clicks, no rubs, 2 out of 6 apical systolic murmur slightly radiating out the outflow tract and early peaking, no diastolic murmurs ABD:  Flat, positive bowel sounds normal in frequency in pitch, no bruits, no rebound, no guarding, no midline pulsatile mass, no hepatomegaly, no splenomegaly EXT:  2 plus pulses throughout, no edema, no cyanosis no clubbing SKIN:  No rashes no nodules NEURO:  Cranial nerves II through XII grossly intact, motor grossly intact throughout PSYCH:  Cognitively intact, oriented to person place and time    EKG:  EKG is not ordered today. The ekg ordered 07/05/15 demonstrates sinus rhythm, rate 75, early transition in lead V2, no acute ST-T wave changes   Recent Labs: 06/28/2015: ALT 50; BUN 18; Creatinine, Ser 1.12; Hemoglobin 15.5; Platelets 222.0; Potassium 5.0; Sodium 141; TSH 1.81    Lipid Panel    Component Value Date/Time   CHOL 167 06/28/2015 0836   TRIG 199.0* 06/28/2015 0836   HDL 32.10* 06/28/2015 0836   CHOLHDL 5 06/28/2015 0836   VLDL 39.8 06/28/2015 0836   LDLCALC 95 06/28/2015 0836   LDLDIRECT 109.1 03/29/2010 0804     Lab Results  Component Value Date   HGBA1C 6.8* 06/28/2015    Wt Readings from Last 3 Encounters:  08/03/15 239 lb 4.8 oz (108.546 kg)  07/05/15 241 lb (109.317 kg)  11/03/14 240 lb (108.863 kg)      Other studies Reviewed: Additional studies/ records that were reviewed today include: Stress perfusion result, EKG, office records. Review of the above records demonstrates:  Please see elsewhere in the note.     ASSESSMENT AND PLAN:  ABNORMAL STRESS TEST:  He's not having any symptoms. He could have an old infarct but this is not evident on EKG.  I'm going to check an echocardiogram. This will help evaluate the  murmur as well as look for regional wall motion abnormality. Regardless without symptoms and with a study that is not high risk I doubt that further invasive testing will be needed. The patient his wife and I had a long discussion about this. He needs aggressive risk reduction however.  MURMUR:  This will be evaluated with the echo above.  OVERWEIGHT:  He and I discussed specifics of diet and exercise.   Current medicines are reviewed at length with the patient today.  The patient does not have concerns regarding medicines.  The following changes have been made:  no change  Labs/ tests ordered today include:  Orders Placed This Encounter  Procedures  . ECHOCARDIOGRAM COMPLETE     Disposition:   FU with me as needed.      Signed, Minus Breeding, MD  08/03/2015 12:54 PM    [facility]

## 2015-08-17 ENCOUNTER — Ambulatory Visit (HOSPITAL_COMMUNITY): Payer: BC Managed Care – PPO | Attending: Cardiology

## 2015-08-17 ENCOUNTER — Other Ambulatory Visit: Payer: Self-pay

## 2015-08-17 DIAGNOSIS — R9439 Abnormal result of other cardiovascular function study: Secondary | ICD-10-CM | POA: Insufficient documentation

## 2015-08-17 DIAGNOSIS — E119 Type 2 diabetes mellitus without complications: Secondary | ICD-10-CM | POA: Insufficient documentation

## 2015-08-17 DIAGNOSIS — I10 Essential (primary) hypertension: Secondary | ICD-10-CM | POA: Insufficient documentation

## 2015-09-07 LAB — HM DIABETES EYE EXAM

## 2015-10-28 ENCOUNTER — Ambulatory Visit (INDEPENDENT_AMBULATORY_CARE_PROVIDER_SITE_OTHER): Payer: BC Managed Care – PPO | Admitting: Pulmonary Disease

## 2015-10-28 ENCOUNTER — Encounter: Payer: Self-pay | Admitting: Pulmonary Disease

## 2015-10-28 VITALS — BP 138/96 | HR 78 | Ht 71.0 in | Wt 241.0 lb

## 2015-10-28 DIAGNOSIS — G4733 Obstructive sleep apnea (adult) (pediatric): Secondary | ICD-10-CM | POA: Diagnosis not present

## 2015-10-28 DIAGNOSIS — Z9989 Dependence on other enabling machines and devices: Principal | ICD-10-CM

## 2015-10-28 NOTE — Progress Notes (Signed)
Current Outpatient Prescriptions on File Prior to Visit  Medication Sig  . ALPRAZolam (XANAX) 0.5 MG tablet Take 1 tablet (0.5 mg total) by mouth at bedtime as needed for anxiety.  Marland Kitchen amLODipine-olmesartan (AZOR) 10-40 MG tablet TAKE 1 TABLET BY MOUTH EVERY DAY  . aspirin 81 MG tablet Take 81 mg by mouth daily.    . diclofenac (VOLTAREN) 75 MG EC tablet Take 1 tablet (75 mg total) by mouth 2 (two) times daily.  Marland Kitchen esomeprazole (NEXIUM) 20 MG capsule Take 2 capsules (40 mg total) by mouth 2 (two) times daily before a meal.  . glipiZIDE (GLUCOTROL) 10 MG tablet TAKE 1 TABLET BY MOUTH TWICE DAILY BEFORE A MEAL  . glucose blood (ONE TOUCH ULTRA TEST) test strip Once a day  . Lancets (ONETOUCH ULTRASOFT) lancets Once a day  . metFORMIN (GLUCOPHAGE) 500 MG tablet TAKE 1 TABLET BY MOUTH TWICE DAILY WITH MEALS  . [DISCONTINUED] eszopiclone (LUNESTA) 2 MG TABS Take 1 tablet (2 mg total) by mouth at bedtime. Take immediately before bedtime   No current facility-administered medications on file prior to visit.     Chief Complaint  Patient presents with  . Follow-up    former [place] pt. pt. states he wears CPAP 8 hr. everynight. pressure set @ 11 feels that's good. supplies needed (mask, hose) DME: [facility]     Tests PSG 04/29/04 >> AHI 35 Echo 08/17/15 >> EF 55 to [id number], grade 1 diastolic dysfx Auto CPAP [id number] to 10/26/15 >> used on 90 of 90 nights with average 7 hrs and 51 min.  Average AHI is 0.2 with median CPAP 8 cm H2O and 95 th percentile CPAP 10 cm H20.   Past medical hx GERD, HTN, DM, Anxiety  Past surgical hx, Allergies, Family hx, Social hx all reviewed.  Vital Signs BP 138/96 mmHg  Pulse 78  Ht 5\' 11"  (1.803 m)  Wt 241 lb (109.317 kg)  BMI 33.63 kg/m2  SpO2 98%  History of Present Illness Justin Herrera is a 65 y.o. male with obstructive sleep apnea.  He is doing well with CPAP.  He got new machine about 1 year ago.  He has no issue with mask fit.  He would like a script for  his mask so he can buy on line rather than going through DME >> he has high deductible, and less expensive to buy on his own.   Physical Exam  General - No distress ENT - No sinus tenderness, no oral exudate, no LAN, MP 3, elongated uvula Cardiac - s1s2 regular, no murmur Chest - No wheeze/rales/dullness Back - No focal tenderness Abd - Soft, non-tender Ext - No edema Neuro - Normal strength Skin - No rashes Psych - normal mood, and behavior   Assessment/Plan  Obstructive sleep apnea. He is compliant with CPAP and reports benefit. Plan: - continue auto CPAP - gave him script for Respironics medium size comfort gel nasal mask   Patient Instructions  Follow up in 1 year     Chesley Mires, MD [facility] Pager:  [phone]

## 2015-10-28 NOTE — Patient Instructions (Signed)
Follow up in 1 year.

## 2016-03-18 NOTE — Progress Notes (Addendum)
Cardiology Office Note   Date:  03/19/2016   ID:  ARLY SEGALLA, DOB January 27, 1951, MRN [id number]  PCP:  Laurey Morale, MD  Cardiologist:   Minus Breeding, MD   Chief Complaint  Patient presents with  . Shortness of Breath      History of Present Illness: Justin Herrera is a 65 y.o. male who presents for follow up of an abnormal stress perfusion study which suggested that his EF was perhaps about 48%. There was a medium defect of moderate severity in the basal inferior, basal inferolateral and inferolateral location with question peri-infarct ischemia. The patient was able to walk 8 minutes and it took a while to get his heart rate above 85% on the stress test.  He said he didn't have any symptoms.   In December I sent him for an echocardiogram.  There was slight LV thickening but nothing that was clinically concerning. The LV did not show any evidence of a previous inferior infarct.  There was some No change in therapy or further studies was indicated at this time.   He returns for follow up.  Since I last saw him he has started eating better and walking daily.  He has lost a few pounds.  Previously he was having some mild dyspnea but this actually is much improved. He says he feels better than he has a long time. He denies any chest pressure, neck or arm discomfort. He's had no palpitations, presyncope or syncope. He has no shortness of breath, PND or orthopnea.  Past Medical History:  Diagnosis Date  . Anxiety   . Arthritis   . Diabetes mellitus   . GERD (gastroesophageal reflux disease)   . Hypertension   . Insomnia   . Sleep apnea, obstructive    wears a CPAP    Past Surgical History:  Procedure Laterality Date  . COLONOSCOPY  11-20-13   per Dr. Fuller Plan, adenomatous polyps, repeat in 5 yrs   . CYSTECTOMY Right 2014   right wrist  . KNEE SURGERY  1968     Current Outpatient Prescriptions  Medication Sig Dispense Refill  . ALPRAZolam (XANAX) 0.5 MG tablet  Take 1 tablet (0.5 mg total) by mouth at bedtime as needed for anxiety. 30 tablet 5  . amLODipine-olmesartan (AZOR) 10-40 MG tablet TAKE 1 TABLET BY MOUTH EVERY DAY 90 tablet 3  . aspirin 81 MG tablet Take 81 mg by mouth daily.      . diclofenac (VOLTAREN) 75 MG EC tablet Take 1 tablet (75 mg total) by mouth 2 (two) times daily. 180 tablet 3  . esomeprazole (NEXIUM) 20 MG capsule Take 2 capsules (40 mg total) by mouth 2 (two) times daily before a meal. 360 capsule 3  . glipiZIDE (GLUCOTROL) 10 MG tablet TAKE 1 TABLET BY MOUTH TWICE DAILY BEFORE A MEAL 180 tablet 2  . glucose blood (ONE TOUCH ULTRA TEST) test strip Once a day 100 each 2  . Lancets (ONETOUCH ULTRASOFT) lancets Once a day 100 each 2  . metFORMIN (GLUCOPHAGE) 500 MG tablet TAKE 1 TABLET BY MOUTH TWICE DAILY WITH MEALS 180 tablet 3   No current facility-administered medications for this visit.     Allergies:   Review of patient's allergies indicates no known allergies.    ROS:  Please see the history of present illness.   Otherwise, review of systems are positive for none.   All other systems are reviewed and negative.    PHYSICAL EXAM: VS:  BP 140/88 (BP Location: Right Arm, Patient Position: Sitting, Cuff Size: Normal)   Pulse 67   Ht 5\' 11"  (1.803 m)   Wt 238 lb 9.6 oz (108.2 kg)   SpO2 97%   BMI 33.28 kg/m  , BMI Body mass index is 33.28 kg/m. GENERAL:  Well appearin NECK:  No jugular venous distention, waveform within normal limits, carotid upstroke brisk and symmetric, no bruits, no thyromegaly LUNGS:  Clear to auscultation bilaterally BACK:  No CVA tenderness CHEST:  Unremarkable HEART:  PMI not displaced or sustained,S1 and S2 within normal limits, no S3, no S4, no clicks, no rubs, 2 out of 6 right upper sternal border systolic murmur slightly radiating out the outflow tract and early peaking, no diastolic murmurs ABD:  Flat, positive bowel sounds normal in frequency in pitch, no bruits, no rebound, no guarding,  no midline pulsatile mass, no hepatomegaly, no splenomegaly EXT:  2 plus pulses throughout, no edema, no cyanosis no clubbing    EKG:  EKG is ordered today. The ekg ordered 07/05/15 demonstrates sinus rhythm, rate 67, early transition in lead V2, no acute ST-T wave changes   Recent Labs: 06/28/2015: ALT 50; BUN 18; Creatinine, Ser 1.12; Hemoglobin 15.5; Platelets 222.0; Potassium 5.0; Sodium 141; TSH 1.81    Lipid Panel    Component Value Date/Time   CHOL 167 06/28/2015 0836   TRIG 199.0 (H) 06/28/2015 0836   HDL 32.10 (L) 06/28/2015 0836   CHOLHDL 5 06/28/2015 0836   VLDL 39.8 06/28/2015 0836   LDLCALC 95 06/28/2015 0836   LDLDIRECT 109.1 03/29/2010 0804     Lab Results  Component Value Date   HGBA1C 6.8 (H) 06/28/2015    Wt Readings from Last 3 Encounters:  03/19/16 238 lb 9.6 oz (108.2 kg)  10/28/15 241 lb (109.3 kg)  08/03/15 239 lb 4.8 oz (108.5 kg)      Other studies Reviewed: Additional studies/ records that were reviewed today include: None Review of the above records demonstrates:    ASSESSMENT AND PLAN:  ABNORMAL STRESS TEST:  He's not having any symptoms and in fact feels better than he used to. He could have an old infarct but this is not evident on echo or EKG.  I suspect artifact on the perfusion study.  Regardless this is not a high risk scan.  .  I'm going to check an echocardiogram. I dont think that further invasive testing is needed.  He will continue with aggressive risk reduction however.  MURMUR:  This might represent some septal hypertrophy and SAM but it is very mild and I will follow this clinically.   OVERWEIGHT:  He and I discussed specifics of diet and exercise again.     Current medicines are reviewed at length with the patient today.  The patient does not have concerns regarding medicines.  The following changes have been made:  no change  Labs/ tests ordered today include:   Orders Placed This Encounter  Procedures  . EKG 12-Lead      Disposition:   FU with me in 18 months.     Signed, Minus Breeding, MD  03/19/2016 11:42 AM    [facility]

## 2016-03-19 ENCOUNTER — Ambulatory Visit (INDEPENDENT_AMBULATORY_CARE_PROVIDER_SITE_OTHER): Payer: BC Managed Care – PPO | Admitting: Cardiology

## 2016-03-19 ENCOUNTER — Encounter: Payer: Self-pay | Admitting: Cardiology

## 2016-03-19 VITALS — BP 140/88 | HR 67 | Ht 71.0 in | Wt 238.6 lb

## 2016-03-19 DIAGNOSIS — I10 Essential (primary) hypertension: Secondary | ICD-10-CM

## 2016-03-19 DIAGNOSIS — R0602 Shortness of breath: Secondary | ICD-10-CM | POA: Diagnosis not present

## 2016-03-19 NOTE — Patient Instructions (Signed)
Your physician wants you to follow-up in: 18 Months. You will receive a reminder letter in the mail two months in advance. If you don't receive a letter, please call our office to schedule the follow-up appointment.

## 2016-05-06 ENCOUNTER — Other Ambulatory Visit: Payer: Self-pay | Admitting: Family Medicine

## 2016-07-05 ENCOUNTER — Other Ambulatory Visit (INDEPENDENT_AMBULATORY_CARE_PROVIDER_SITE_OTHER): Payer: BC Managed Care – PPO

## 2016-07-05 DIAGNOSIS — Z0000 Encounter for general adult medical examination without abnormal findings: Secondary | ICD-10-CM | POA: Diagnosis not present

## 2016-07-05 LAB — LIPID PANEL
CHOL/HDL RATIO: 5
CHOLESTEROL: 168 mg/dL (ref 0–200)
HDL: 31 mg/dL — ABNORMAL LOW (ref >39.00)
LDL CALC: 106 mg/dL — AB (ref 0–99)
NonHDL: 137.03
TRIGLYCERIDES: 154 mg/dL — AB (ref 0.0–149.0)
VLDL: 30.8 mg/dL (ref 0.0–40.0)

## 2016-07-05 LAB — POC URINALSYSI DIPSTICK (AUTOMATED)
BILIRUBIN UA: NEGATIVE
GLUCOSE UA: NEGATIVE
Ketones, UA: NEGATIVE
Leukocytes, UA: NEGATIVE
NITRITE UA: NEGATIVE
RBC UA: NEGATIVE
SPEC GRAV UA: 1.02
UROBILINOGEN UA: 0.2
pH, UA: 5

## 2016-07-05 LAB — CBC WITH DIFFERENTIAL/PLATELET
Basophils Absolute: 0 10*3/uL (ref 0.0–0.1)
Basophils Relative: 0.8 % (ref 0.0–3.0)
EOS ABS: 0.2 10*3/uL (ref 0.0–0.7)
Eosinophils Relative: 2.7 % (ref 0.0–5.0)
HCT: 44.7 % (ref 39.0–52.0)
HEMOGLOBIN: 15.6 g/dL (ref 13.0–17.0)
LYMPHS ABS: 1.3 10*3/uL (ref 0.7–4.0)
Lymphocytes Relative: 22.1 % (ref 12.0–46.0)
MCHC: 34.9 g/dL (ref 30.0–36.0)
MCV: 86.3 fl (ref 78.0–100.0)
MONO ABS: 0.6 10*3/uL (ref 0.1–1.0)
Monocytes Relative: 9.5 % (ref 3.0–12.0)
NEUTROS PCT: 64.9 % (ref 43.0–77.0)
Neutro Abs: 3.8 10*3/uL (ref 1.4–7.7)
Platelets: 234 10*3/uL (ref 150.0–400.0)
RBC: 5.17 Mil/uL (ref 4.22–5.81)
RDW: 12.8 % (ref 11.5–15.5)
WBC: 5.9 10*3/uL (ref 4.0–10.5)

## 2016-07-05 LAB — HEPATIC FUNCTION PANEL
ALT: 40 U/L (ref 0–53)
AST: 22 U/L (ref 0–37)
Albumin: 4.3 g/dL (ref 3.5–5.2)
Alkaline Phosphatase: 45 U/L (ref 39–117)
BILIRUBIN DIRECT: 0.2 mg/dL (ref 0.0–0.3)
BILIRUBIN TOTAL: 0.8 mg/dL (ref 0.2–1.2)
Total Protein: 6.6 g/dL (ref 6.0–8.3)

## 2016-07-05 LAB — BASIC METABOLIC PANEL
BUN: 18 mg/dL (ref 6–23)
CO2: 29 mEq/L (ref 19–32)
CREATININE: 1.1 mg/dL (ref 0.40–1.50)
Calcium: 9.8 mg/dL (ref 8.4–10.5)
Chloride: 103 mEq/L (ref 96–112)
GFR: 71.51 mL/min (ref >60.00)
GLUCOSE: 161 mg/dL — AB (ref 70–99)
Potassium: 4.2 mEq/L (ref 3.5–5.1)
Sodium: 138 mEq/L (ref 135–145)

## 2016-07-05 LAB — MICROALBUMIN / CREATININE URINE RATIO
Creatinine,U: 126.5 mg/dL
MICROALB UR: 5 mg/dL — AB (ref 0.0–1.9)
MICROALB/CREAT RATIO: 4 mg/g (ref 0.0–30.0)

## 2016-07-05 LAB — PSA: PSA: 0.55 ng/mL (ref 0.10–4.00)

## 2016-07-05 LAB — HEMOGLOBIN A1C: HEMOGLOBIN A1C: 6.8 % — AB (ref 4.6–6.5)

## 2016-07-05 LAB — TSH: TSH: 1.76 u[IU]/mL (ref 0.35–4.50)

## 2016-07-10 ENCOUNTER — Encounter: Payer: BC Managed Care – PPO | Admitting: Family Medicine

## 2016-07-31 ENCOUNTER — Ambulatory Visit (INDEPENDENT_AMBULATORY_CARE_PROVIDER_SITE_OTHER): Payer: BC Managed Care – PPO | Admitting: Family Medicine

## 2016-07-31 ENCOUNTER — Encounter: Payer: Self-pay | Admitting: Family Medicine

## 2016-07-31 VITALS — BP 153/97 | HR 76 | Temp 97.8°F | Ht 71.0 in | Wt 236.0 lb

## 2016-07-31 DIAGNOSIS — Z0000 Encounter for general adult medical examination without abnormal findings: Secondary | ICD-10-CM | POA: Diagnosis not present

## 2016-07-31 DIAGNOSIS — Z23 Encounter for immunization: Secondary | ICD-10-CM | POA: Diagnosis not present

## 2016-07-31 MED ORDER — ESOMEPRAZOLE MAGNESIUM 20 MG PO CPDR: 40.0000 mg | DELAYED_RELEASE_CAPSULE | Freq: Two times a day (BID) | ORAL | 3 refills | Status: DC

## 2016-07-31 MED ORDER — AMLODIPINE-OLMESARTAN 10-40 MG PO TABS: ORAL_TABLET | ORAL | 3 refills | Status: DC

## 2016-07-31 MED ORDER — GLIPIZIDE 10 MG PO TABS: ORAL_TABLET | ORAL | 3 refills | Status: DC

## 2016-07-31 MED ORDER — METOPROLOL SUCCINATE ER 50 MG PO TB24: 50.0000 mg | ORAL_TABLET | Freq: Every day | ORAL | 3 refills | Status: DC

## 2016-07-31 MED ORDER — METFORMIN HCL 500 MG PO TABS: ORAL_TABLET | ORAL | 3 refills | Status: DC

## 2016-07-31 MED ORDER — DICLOFENAC SODIUM 75 MG PO TBEC: 75.0000 mg | DELAYED_RELEASE_TABLET | Freq: Two times a day (BID) | ORAL | 3 refills | Status: DC

## 2016-07-31 MED ORDER — ALPRAZOLAM 0.5 MG PO TABS: 0.5000 mg | ORAL_TABLET | Freq: Every evening | ORAL | 5 refills | Status: DC | PRN

## 2016-07-31 NOTE — Progress Notes (Signed)
   Subjective:    Patient ID: Justin Herrera, male    DOB: November 29, 1950, 65 y.o.   MRN: [id number]  HPI 65 yr old male for a well exam. He feels fine. We do note however that his BP has been running a little high for the past year, often in the range of 140-150 over 90.    Review of Systems  Constitutional: Negative.   HENT: Negative.   Eyes: Negative.   Respiratory: Negative.   Cardiovascular: Negative.   Gastrointestinal: Negative.   Genitourinary: Negative.   Musculoskeletal: Negative.   Skin: Negative.   Neurological: Negative.   Psychiatric/Behavioral: Negative.        Objective:   Physical Exam  Constitutional: He is oriented to person, place, and time. He appears well-developed and well-nourished. No distress.  HENT:  Head: Normocephalic and atraumatic.  Right Ear: External ear normal.  Left Ear: External ear normal.  Nose: Nose normal.  Mouth/Throat: Oropharynx is clear and moist. No oropharyngeal exudate.  Eyes: Conjunctivae and EOM are normal. Pupils are equal, round, and reactive to light. Right eye exhibits no discharge. Left eye exhibits no discharge. No scleral icterus.  Neck: Neck supple. No JVD present. No tracheal deviation present. No thyromegaly present.  Cardiovascular: Normal rate, regular rhythm, normal heart sounds and intact distal pulses.  Exam reveals no gallop and no friction rub.   No murmur heard. Pulmonary/Chest: Effort normal and breath sounds normal. No respiratory distress. He has no wheezes. He has no rales. He exhibits no tenderness.  Abdominal: Soft. Bowel sounds are normal. He exhibits no distension and no mass. There is no tenderness. There is no rebound and no guarding.  Genitourinary: Rectum normal, prostate normal and penis normal. Rectal exam shows guaiac negative stool. No penile tenderness.  Musculoskeletal: Normal range of motion. He exhibits no edema or tenderness.  Lymphadenopathy:    He has no cervical adenopathy.    Neurological: He is alert and oriented to person, place, and time. He has normal reflexes. No cranial nerve deficit. He exhibits normal muscle tone. Coordination normal.  Skin: Skin is warm and dry. No rash noted. He is not diaphoretic. No erythema. No pallor.  Psychiatric: He has a normal mood and affect. His behavior is normal. Judgment and thought content normal.          Assessment & Plan:  Well exam. We discussed diet ans exercise. For the HTN we will add Metoprolol succinate 50 mg daily. He will follow the BP at home.  Laurey Morale, MD

## 2016-07-31 NOTE — Progress Notes (Signed)
Pre visit review using our clinic review tool, if applicable. No additional management support is needed unless otherwise documented below in the visit note. 

## 2016-11-11 ENCOUNTER — Other Ambulatory Visit: Payer: Self-pay | Admitting: Family Medicine

## 2016-12-06 ENCOUNTER — Ambulatory Visit (INDEPENDENT_AMBULATORY_CARE_PROVIDER_SITE_OTHER): Payer: Medicare Other | Admitting: Family Medicine

## 2016-12-06 ENCOUNTER — Encounter: Payer: Self-pay | Admitting: Family Medicine

## 2016-12-06 VITALS — BP 122/82 | HR 77 | Temp 97.9°F | Ht 71.0 in | Wt 234.8 lb

## 2016-12-06 DIAGNOSIS — L255 Unspecified contact dermatitis due to plants, except food: Secondary | ICD-10-CM

## 2016-12-06 MED ORDER — PREDNISONE 10 MG PO TABS: ORAL_TABLET | ORAL | 0 refills | Status: DC

## 2016-12-06 NOTE — Patient Instructions (Signed)
Follow up in 1-2 weeks.  Prednisone - follow instructions.  Zyrtec daily at night.  Monitor blood sugars and treat with extra dose metformin in the morning as we discussed.  I hope you are feeling better soon! Seek care sooner f worsening, new concerns or you are not improving with treatment.   Poison Ivy Dermatitis Poison ivy dermatitis is inflammation of the skin that is caused by the allergens on the leaves of the poison ivy plant. The skin reaction often involves redness, swelling, blisters, and extreme itching. What are the causes? This condition is caused by a specific chemical (urushiol) found in the sap of the poison ivy plant. This chemical is sticky and can be easily spread to people, animals, and objects. You can get poison ivy dermatitis by:  Having direct contact with a poison ivy plant.  Touching animals, other people, or objects that have come in contact with poison ivy and have the chemical on them. What increases the risk? This condition is more likely to develop in:  People who are outdoors often.  People who go outdoors without wearing protective clothing, such as closed shoes, long pants, and a long-sleeved shirt. What are the signs or symptoms? Symptoms of this condition include:  Redness and itching.  A rash that often includes bumps and blisters. The rash usually appears 48 hours after exposure.  Swelling. This may occur if the reaction is more severe. Symptoms usually last for 1-2 weeks. However, the symptoms may last 3-4 weeks. How is this diagnosed? This condition may be diagnosed based on your symptoms and a physical exam. Your health care provider may also ask you about any recent outdoor activity. How is this treated? Treatment for this condition will vary depending on how severe it is. Treatment may include:  Hydrocortisone creams or calamine lotions to relieve itching.  Oatmeal baths to soothe the skin.  Over-the-counter antihistamine  tablets.  Oral steroid medicine for more severe outbreaks. Follow these instructions at home:  Take or apply over-the-counter and prescription medicines only as told by your health care provider.  Wash exposed skin as soon as possible with soap and cold water.  Use hydrocortisone creams or calamine lotion as needed to soothe the skin and relieve itching.  Take oatmeal baths as needed. Use colloidal oatmeal. You can get this at your local pharmacy or grocery store. Follow the instructions on the packaging.  Do not scratch or rub your skin.  While you have the rash, wash clothes right after you wear them. How is this prevented?  Learn to identify the poison ivy plant and avoid contact with the plant. This plant can be recognized by the number of leaves. Generally, poison ivy has three leaves with flowering branches on a single stem. The leaves are typically glossy, and they have jagged edges that come to a point at the front.  If you have been exposed to poison ivy, thoroughly wash with soap and water right away. You have about 30 minutes to remove the plant resin before it will cause the rash. Be sure to wash under your fingernails because any plant resin there will continue to spread the rash.  When hiking or camping, wear clothes that will help you to avoid exposure on the skin. This includes long pants, a long-sleeved shirt, tall socks, and hiking boots. You can also apply preventive lotion to your skin to help limit exposure.  If you suspect that your clothes or outdoor gear came in contact with poison ivy,  rinse them off outside with a garden hose before you bring them inside your house. Contact a health care provider if:  You have open sores in the rash area.  You have more redness, swelling, or pain in the affected area.  You have redness that spreads beyond the rash area.  You have fluid, blood, or pus coming from the affected area.  You have a fever.  You have a rash over  a large area of your body.  You have a rash on your eyes, mouth, or genitals.  Your rash does not improve after a few days. Get help right away if:  Your face swells or your eyes swell shut.  You have trouble breathing.  You have trouble swallowing. This information is not intended to replace advice given to you by your health care provider. Make sure you discuss any questions you have with your health care provider. Document Released: 08/10/2000 Document Revised: 01/19/2016 Document Reviewed: 01/19/2015 Elsevier Interactive Patient Education  2017 [employer].

## 2016-12-06 NOTE — Progress Notes (Signed)
Pre visit review using our clinic review tool, if applicable. No additional management support is needed unless otherwise documented below in the visit note. 

## 2016-12-06 NOTE — Progress Notes (Signed)
HPI:  Acute visit for "poison ivy" -1 week ago developed papulovesicular rash all over (bilat arms, legs, L ear, trunk) after pulling brush out of yard -reports he has had reaction to poison ivy before -he has been trying topical benadryl and calamine -intensely pruritic, has not slept -no fevers, chills, malaise, SOB, DOE -has mild, well controlled diabetes  ROS: See pertinent positives and negatives per HPI.  Past Medical History:  Diagnosis Date  . Anxiety   . Arthritis   . Diabetes mellitus   . GERD (gastroesophageal reflux disease)   . Hypertension   . Insomnia   . Sleep apnea, obstructive    wears a CPAP    Past Surgical History:  Procedure Laterality Date  . COLONOSCOPY  11-20-13   per Dr. Fuller Plan, adenomatous polyps, repeat in 5 yrs   . CYSTECTOMY Right 2014   right wrist  . KNEE SURGERY  1968    Family History  Problem Relation Age of Onset  . Heart attack Maternal Grandfather 27    Died suddenly "family lore suggested an MI"  . Colon cancer Neg Hx   . Stomach cancer Neg Hx     Social History   Social History  . Marital status: Married    Spouse name: N/A  . Number of children: 0  . Years of education: N/A   Occupational History  . Engineer   . Engineer, structural     Retired   Social History Main Topics  . Smoking status: Never Smoker  . Smokeless tobacco: Never Used  . Alcohol use No  . Drug use: No  . Sexual activity: Not Asked   Other Topics Concern  . None   Social History Narrative   Lives at home with wife and four dogs.      Current Outpatient Prescriptions:  .  ALPRAZolam (XANAX) 0.5 MG tablet, Take 1 tablet (0.5 mg total) by mouth at bedtime as needed for anxiety., Disp: 30 tablet, Rfl: 5 .  amLODipine-olmesartan (AZOR) 10-40 MG tablet, TAKE 1 TABLET BY MOUTH EVERY DAY, Disp: 90 tablet, Rfl: 3 .  aspirin 81 MG tablet, Take 81 mg by mouth daily.  , Disp: , Rfl:  .  diclofenac (VOLTAREN) 75 MG EC tablet, Take 1 tablet (75 mg total)  by mouth 2 (two) times daily., Disp: 180 tablet, Rfl: 3 .  esomeprazole (NEXIUM) 20 MG capsule, Take 2 capsules (40 mg total) by mouth 2 (two) times daily before a meal., Disp: 360 capsule, Rfl: 3 .  esomeprazole (NEXIUM) 20 MG capsule, TAKE 2 CAPSULES BY MOUTH TWICE DAILY BEFORE MEALS, Disp: 360 capsule, Rfl: 3 .  glipiZIDE (GLUCOTROL) 10 MG tablet, TAKE 1 TABLET BY MOUTH TWICE DAILY BEFORE A MEAL, Disp: 180 tablet, Rfl: 3 .  glucose blood (ONE TOUCH ULTRA TEST) test strip, Once a day, Disp: 100 each, Rfl: 2 .  Lancets (ONETOUCH ULTRASOFT) lancets, Once a day, Disp: 100 each, Rfl: 2 .  metFORMIN (GLUCOPHAGE) 500 MG tablet, TAKE 1 TABLET BY MOUTH TWICE DAILY WITH MEALS, Disp: 180 tablet, Rfl: 3 .  metoprolol succinate (TOPROL-XL) 50 MG 24 hr tablet, Take 1 tablet (50 mg total) by mouth daily. Take with or immediately following a meal., Disp: 90 tablet, Rfl: 3 .  predniSONE (DELTASONE) 10 MG tablet, 5 tablets (50mg ) daily for 3 days, then 4 tablets (40 mg) daily for 3 days, then 3 tablets (30mg ) daily for 3 days, then 2 tablets (20 mg) daily for 3 days, then 1 tablet (  10 mg) daily for 3 days., Disp: 45 tablet, Rfl: 0  EXAM:  Vitals:   12/06/16 1657  BP: 122/82  Pulse: 77  Temp: 97.9 F (36.6 C)    Body mass index is 32.75 kg/m.  GENERAL: vitals reviewed and listed above, alert, oriented, appears well hydrated and in no acute distress  HEENT: atraumatic, conjunttiva clear, no obvious abnormalities on inspection of external nose and ears  NECK: no obvious masses on inspection  LUNGS: clear to auscultation bilaterally, no wheezes, rales or rhonchi, good air movement  CV: HRRR, no peripheral edema  SKIN: difuse erythematous papulovesicular rash on arms, legs, truck and L ear - excoriations throughout  MS: moves all extremities without noticeable abnormality  PSYCH: pleasant and cooperative, no obvious depression or anxiety  ASSESSMENT AND PLAN:  Discussed the following assessment  and plan:  Toxicodendron dermatitis  -we discussed possible serious and likely etiologies, workup and treatment, treatment risks and return precautions; 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   Patient Instructions  Follow up in 1-2 weeks.  Prednisone - follow instructions.  Zyrtec daily at night.  Monitor blood sugars and treat with extra dose metformin in the morning as we discussed.  I hope you are feeling better soon! Seek care sooner f worsening, new concerns or you are not improving with treatment.   Poison Ivy Dermatitis Poison ivy dermatitis is inflammation of the skin that is caused by the allergens on the leaves of the poison ivy plant. The skin reaction often involves redness, swelling, blisters, and extreme itching. What are the causes? This condition is caused by a specific chemical (urushiol) found in the sap of the poison ivy plant. This chemical is sticky and can be easily spread to people, animals, and objects. You can get poison ivy dermatitis by:  Having direct contact with a poison ivy plant.  Touching animals, other people, or objects that have come in contact with poison ivy and have the chemical on them. What increases the risk? This condition is more likely to develop in:  People who are outdoors often.  People who go outdoors without wearing protective clothing, such as closed shoes, long pants, and a long-sleeved shirt. What are the signs or symptoms? Symptoms of this condition include:  Redness and itching.  A rash that often includes bumps and blisters. The rash  usually appears 48 hours after exposure.  Swelling. This may occur if the reaction is more severe. Symptoms usually last for 1-2 weeks. However, the symptoms may last 3-4 weeks. How is this diagnosed? This condition may be diagnosed based on your symptoms and a physical exam. Your health care provider may also ask you about any recent outdoor activity. How is this treated? Treatment for this condition will vary depending on how severe it is. Treatment may include:  Hydrocortisone creams or calamine lotions to relieve itching.  Oatmeal baths to soothe the skin.  Over-the-counter antihistamine tablets.  Oral steroid medicine for more severe outbreaks. Follow these instructions at home:  Take or apply over-the-counter and prescription medicines only as told by your health care provider.  Wash exposed skin as soon  as possible with soap and cold water.  Use hydrocortisone creams or calamine lotion as needed to soothe the skin and relieve itching.  Take oatmeal baths as needed. Use colloidal oatmeal. You can get this at your local pharmacy or grocery store. Follow the instructions on the packaging.  Do not scratch or rub your skin.  While you have the rash, wash clothes right after you wear them. How is this prevented?  Learn to identify the poison ivy plant and avoid contact with the plant. This plant can be recognized by the number of leaves. Generally, poison ivy has three leaves with flowering branches on a single stem. The leaves are typically glossy, and they have jagged edges that come to a point at the front.  If you have been exposed to poison ivy, thoroughly wash with soap and water right away. You have about 30 minutes to remove the plant resin before it will cause the rash. Be sure to wash under your fingernails because any plant resin there will continue to spread the rash.  When hiking or camping, wear clothes that will help you to avoid exposure on the skin. This includes  long pants, a long-sleeved shirt, tall socks, and hiking boots. You can also apply preventive lotion to your skin to help limit exposure.  If you suspect that your clothes or outdoor gear came in contact with poison ivy, rinse them off outside with a garden hose before you bring them inside your house. Contact a health care provider if:  You have open sores in the rash area.  You have more redness, swelling, or pain in the affected area.  You have redness that spreads beyond the rash area.  You have fluid, blood, or pus coming from the affected area.  You have a fever.  You have a rash over a large area of your body.  You have a rash on your eyes, mouth, or genitals.  Your rash does not improve after a few days. Get help right away if:  Your face swells or your eyes swell shut.  You have trouble breathing.  You have trouble swallowing. This information is not intended to replace advice given to you by your health care provider. Make sure you discuss any questions you have with your health care provider. Document Released: 08/10/2000 Document Revised: 01/19/2016 Document Reviewed: 01/19/2015 Elsevier Interactive Patient Education  2017 [employer]., DO

## 2016-12-13 ENCOUNTER — Encounter: Payer: Self-pay | Admitting: Family Medicine

## 2016-12-13 ENCOUNTER — Ambulatory Visit (INDEPENDENT_AMBULATORY_CARE_PROVIDER_SITE_OTHER): Payer: Medicare Other | Admitting: Family Medicine

## 2016-12-13 VITALS — BP 140/90 | HR 56 | Temp 98.2°F | Ht 71.0 in | Wt 241.0 lb

## 2016-12-13 DIAGNOSIS — L237 Allergic contact dermatitis due to plants, except food: Secondary | ICD-10-CM

## 2016-12-13 NOTE — Patient Instructions (Signed)
WE NOW OFFER   [facility] FAST TR[facility]!!!  SAME DAY Appointments for ACUTE CARE  Such as: Sprains, Injuries, cuts, abrasions, rashes, muscle pain, joint pain, back pain Colds, flu, sore throats, headache, allergies, cough, fever  Ear pain, sinus and eye infections Abdominal pain, nausea, vomiting, diarrhea, upset stomach Animal/insect bites  3 Easy Ways to Schedule: Walk-In Scheduling Call in scheduling Mychart Sign-up: [email or web address]         

## 2016-12-13 NOTE — Progress Notes (Signed)
   Subjective:    Patient ID: Justin Herrera, male    DOB: 11/13/1950, 66 y.o.   MRN: [id number]  HPI Here to recheck dermatitis after exposure to poison ivy, etc. Last week he was cleaning brush in his yard when he developed an itchy rash over the arms and trunk. He was seen here last week and was put on a Prednisone taper. Today he feels much better and the itching has almost stopped.    Review of Systems  Constitutional: Negative.   Respiratory: Negative.   Cardiovascular: Negative.   Skin: Positive for rash.  Neurological: Negative.        Objective:   Physical Exam  Constitutional: He appears well-developed and well-nourished. No distress.  Cardiovascular: Normal rate, regular rhythm, normal heart sounds and intact distal pulses.   Pulmonary/Chest: Effort normal and breath sounds normal.  Skin:  There is resolving erythema with signs of excoriation on both forearms           Assessment & Plan:  Contact dermatitis, resolving. He will finish out the Prednisone taper. Recheck prn.  Alysia Penna, MD

## 2016-12-13 NOTE — Progress Notes (Signed)
Pre visit review using our clinic review tool, if applicable. No additional management support is needed unless otherwise documented below in the visit note. 

## 2017-06-05 ENCOUNTER — Ambulatory Visit (INDEPENDENT_AMBULATORY_CARE_PROVIDER_SITE_OTHER): Payer: Medicare Other

## 2017-06-05 DIAGNOSIS — Z23 Encounter for immunization: Secondary | ICD-10-CM

## 2017-07-10 ENCOUNTER — Encounter: Payer: Self-pay | Admitting: Family Medicine

## 2017-07-10 LAB — HM DIABETES EYE EXAM

## 2017-08-06 ENCOUNTER — Encounter: Payer: Medicare Other | Admitting: Family Medicine

## 2017-08-06 ENCOUNTER — Other Ambulatory Visit: Payer: Self-pay | Admitting: Family Medicine

## 2017-08-14 ENCOUNTER — Ambulatory Visit (INDEPENDENT_AMBULATORY_CARE_PROVIDER_SITE_OTHER): Payer: Medicare Other | Admitting: Family Medicine

## 2017-08-14 ENCOUNTER — Encounter: Payer: Self-pay | Admitting: Family Medicine

## 2017-08-14 VITALS — BP 118/80 | HR 66 | Temp 98.4°F | Ht 70.5 in | Wt 235.0 lb

## 2017-08-14 DIAGNOSIS — N138 Other obstructive and reflux uropathy: Secondary | ICD-10-CM

## 2017-08-14 DIAGNOSIS — F5101 Primary insomnia: Secondary | ICD-10-CM | POA: Diagnosis not present

## 2017-08-14 DIAGNOSIS — N401 Enlarged prostate with lower urinary tract symptoms: Secondary | ICD-10-CM

## 2017-08-14 DIAGNOSIS — K219 Gastro-esophageal reflux disease without esophagitis: Secondary | ICD-10-CM

## 2017-08-14 DIAGNOSIS — I10 Essential (primary) hypertension: Secondary | ICD-10-CM

## 2017-08-14 DIAGNOSIS — F411 Generalized anxiety disorder: Secondary | ICD-10-CM

## 2017-08-14 DIAGNOSIS — E119 Type 2 diabetes mellitus without complications: Secondary | ICD-10-CM

## 2017-08-14 LAB — CBC WITH DIFFERENTIAL/PLATELET
BASOS PCT: 0.9 % (ref 0.0–3.0)
Basophils Absolute: 0.1 10*3/uL (ref 0.0–0.1)
EOS ABS: 0.2 10*3/uL (ref 0.0–0.7)
EOS PCT: 2.9 % (ref 0.0–5.0)
HCT: 46.4 % (ref 39.0–52.0)
HEMOGLOBIN: 16.1 g/dL (ref 13.0–17.0)
LYMPHS ABS: 1.3 10*3/uL (ref 0.7–4.0)
Lymphocytes Relative: 22.9 % (ref 12.0–46.0)
MCHC: 34.7 g/dL (ref 30.0–36.0)
MCV: 87 fl (ref 78.0–100.0)
MONO ABS: 0.6 10*3/uL (ref 0.1–1.0)
Monocytes Relative: 10.3 % (ref 3.0–12.0)
NEUTROS ABS: 3.6 10*3/uL (ref 1.4–7.7)
NEUTROS PCT: 63 % (ref 43.0–77.0)
PLATELETS: 241 10*3/uL (ref 150.0–400.0)
RBC: 5.33 Mil/uL (ref 4.22–5.81)
RDW: 13.4 % (ref 11.5–15.5)
WBC: 5.7 10*3/uL (ref 4.0–10.5)

## 2017-08-14 LAB — BASIC METABOLIC PANEL
BUN: 16 mg/dL (ref 6–23)
CO2: 31 mEq/L (ref 19–32)
Calcium: 9.8 mg/dL (ref 8.4–10.5)
Chloride: 101 mEq/L (ref 96–112)
Creatinine, Ser: 1.08 mg/dL (ref 0.40–1.50)
GFR: 72.78 mL/min (ref >60.00)
GLUCOSE: 124 mg/dL — AB (ref 70–99)
POTASSIUM: 4.3 meq/L (ref 3.5–5.1)
SODIUM: 140 meq/L (ref 135–145)

## 2017-08-14 LAB — POC URINALSYSI DIPSTICK (AUTOMATED)
Bilirubin, UA: NEGATIVE
Blood, UA: NEGATIVE
GLUCOSE UA: NEGATIVE
Ketones, UA: NEGATIVE
LEUKOCYTES UA: NEGATIVE
Nitrite, UA: NEGATIVE
SPEC GRAV UA: 1.025 (ref 1.010–1.025)
UROBILINOGEN UA: 0.2 U/dL
pH, UA: 6 (ref 5.0–8.0)

## 2017-08-14 LAB — LIPID PANEL
CHOLESTEROL: 154 mg/dL (ref 0–200)
HDL: 32.5 mg/dL — ABNORMAL LOW (ref >39.00)
LDL CALC: 97 mg/dL (ref 0–99)
NONHDL: 121.4
Total CHOL/HDL Ratio: 5
Triglycerides: 124 mg/dL (ref 0.0–149.0)
VLDL: 24.8 mg/dL (ref 0.0–40.0)

## 2017-08-14 LAB — HEPATIC FUNCTION PANEL
ALK PHOS: 55 U/L (ref 39–117)
ALT: 31 U/L (ref 0–53)
AST: 19 U/L (ref 0–37)
Albumin: 4.5 g/dL (ref 3.5–5.2)
BILIRUBIN TOTAL: 0.8 mg/dL (ref 0.2–1.2)
Bilirubin, Direct: 0.2 mg/dL (ref 0.0–0.3)
Total Protein: 7.2 g/dL (ref 6.0–8.3)

## 2017-08-14 LAB — HEMOGLOBIN A1C: HEMOGLOBIN A1C: 7 % — AB (ref 4.6–6.5)

## 2017-08-14 LAB — TSH: TSH: 1.66 u[IU]/mL (ref 0.35–4.50)

## 2017-08-14 LAB — PSA: PSA: 0.64 ng/mL (ref 0.10–4.00)

## 2017-08-14 MED ORDER — ESCITALOPRAM OXALATE 10 MG PO TABS: 10.0000 mg | ORAL_TABLET | Freq: Every day | ORAL | 2 refills | Status: DC

## 2017-08-14 MED ORDER — DICLOFENAC SODIUM 75 MG PO TBEC: 75.0000 mg | DELAYED_RELEASE_TABLET | Freq: Two times a day (BID) | ORAL | 3 refills | Status: DC

## 2017-08-14 MED ORDER — ALPRAZOLAM 0.5 MG PO TABS: 0.5000 mg | ORAL_TABLET | Freq: Every evening | ORAL | 5 refills | Status: DC | PRN

## 2017-08-14 NOTE — Progress Notes (Signed)
   Subjective:    Patient ID: Justin [facility]rera, male    DOB: 06-Sep-1950, 66 y.o.   MRN: [id number]  HPI Here to follow up on issues. His BP is stable. His glucoses range from 80 to 150 at home. He still uses Xanax at times for sleep. He asks about something to help him relax during the day however. He tends to worry about things and he has a short temper. His wife complains that he can be difficult to live with at times. He denies any sadness or hopelessness.    Review of Systems  Constitutional: Negative.   HENT: Negative.   Eyes: Negative.   Respiratory: Negative.   Cardiovascular: Negative.   Gastrointestinal: Negative.   Genitourinary: Negative.   Musculoskeletal: Negative.   Skin: Negative.   Neurological: Negative.   Psychiatric/Behavioral: Positive for agitation and sleep disturbance. Negative for behavioral problems, confusion, decreased concentration, dysphoric mood, hallucinations, self-injury and suicidal ideas. The patient is nervous/anxious.        Objective:   Physical Exam  Constitutional: He is oriented to person, place, and time. He appears well-developed and well-nourished. No distress.  HENT:  Head: Normocephalic and atraumatic.  Right Ear: External ear normal.  Left Ear: External ear normal.  Nose: Nose normal.  Mouth/Throat: Oropharynx is clear and moist. No oropharyngeal exudate.  Eyes: Conjunctivae and EOM are normal. Pupils are equal, round, and reactive to light. Right eye exhibits no discharge. Left eye exhibits no discharge. No scleral icterus.  Neck: Neck supple. No JVD present. No tracheal deviation present. No thyromegaly present.  Cardiovascular: Normal rate, regular rhythm, normal heart sounds and intact distal pulses. Exam reveals no gallop and no friction rub.  No murmur heard. Pulmonary/Chest: Effort normal and breath sounds normal. No respiratory distress. He has no wheezes. He has no rales. He exhibits no tenderness.  Abdominal: Soft. Bowel  sounds are normal. He exhibits no distension and no mass. There is no tenderness. There is no rebound and no guarding.  Genitourinary: Rectum normal, prostate normal and penis normal. Rectal exam shows guaiac negative stool. No penile tenderness.  Musculoskeletal: Normal range of motion. He exhibits no edema or tenderness.  Lymphadenopathy:    He has no cervical adenopathy.  Neurological: He is alert and oriented to person, place, and time. He has normal reflexes. No cranial nerve deficit. He exhibits normal muscle tone. Coordination normal.  Skin: Skin is warm and dry. No rash noted. He is not diaphoretic. No erythema. No pallor.  Psychiatric: He has a normal mood and affect. His behavior is normal. Judgment and thought content normal.          Assessment & Plan:  His HTN is stable. For the dyslipidemia and diabetes we will get fasting labs to include lipids and an A1c. His GERD is controlled. He has some anxiety issues, and he will try Lexapro 10 mg daily. Recheck in 3 weeks.  Alysia Penna, MD

## 2017-09-12 ENCOUNTER — Ambulatory Visit (INDEPENDENT_AMBULATORY_CARE_PROVIDER_SITE_OTHER): Payer: Medicare Other | Admitting: Family Medicine

## 2017-09-12 ENCOUNTER — Encounter: Payer: Self-pay | Admitting: Family Medicine

## 2017-09-12 VITALS — BP 126/78 | HR 73 | Temp 98.2°F | Wt 236.6 lb

## 2017-09-12 DIAGNOSIS — F411 Generalized anxiety disorder: Secondary | ICD-10-CM

## 2017-09-12 MED ORDER — SERTRALINE HCL 50 MG PO TABS: 50.0000 mg | ORAL_TABLET | Freq: Every day | ORAL | 3 refills | Status: DC

## 2017-09-12 NOTE — Progress Notes (Signed)
   Subjective:    Patient ID: Justin [facility]rera, male    DOB: May 19, 1951, 67 y.o.   MRN: [id number]  HPI Here to follow up on anxiety. We saw him a few weeks ago where described feeling anxious and his wife said he was very irritable. Not much in the way of depression symptoms. He has been taking Lexapro 10 mg daily, and his wife has noticed a definite improvement in his mood and irritability. However he says the medication has caused some insomnia, a lack of libido, and buzzing in both ears.    Review of Systems  Constitutional: Negative.   Respiratory: Negative.   Cardiovascular: Negative.   Neurological: Negative.   Psychiatric/Behavioral: Positive for sleep disturbance. The patient is nervous/anxious.        Objective:   Physical Exam  Constitutional: He is oriented to person, place, and time. He appears well-developed and well-nourished.  Cardiovascular: Normal rate, regular rhythm, normal heart sounds and intact distal pulses.  Pulmonary/Chest: Effort normal and breath sounds normal. No respiratory distress. He has no wheezes. He has no rales.  Neurological: He is alert and oriented to person, place, and time.  Psychiatric: He has a normal mood and affect. His behavior is normal. Thought content normal.          Assessment & Plan:  Anxiety. We will stop Lexapro and try Zoloft 50 mg daily. Recheck in 3 weeks.  Alysia Penna, MD

## 2017-11-06 ENCOUNTER — Other Ambulatory Visit: Payer: Self-pay | Admitting: Family Medicine

## 2017-11-06 NOTE — Telephone Encounter (Signed)
Last OV 09/12/2017   All medications last refilled 08/07/2017 with no refills   Rx's sent

## 2018-01-14 ENCOUNTER — Other Ambulatory Visit: Payer: Self-pay | Admitting: Family Medicine

## 2018-02-17 ENCOUNTER — Encounter: Payer: Self-pay | Admitting: Family Medicine

## 2018-02-17 ENCOUNTER — Ambulatory Visit: Payer: Medicare Other | Admitting: Family Medicine

## 2018-02-17 VITALS — BP 130/80 | HR 94 | Temp 98.3°F | Ht 70.5 in | Wt 233.4 lb

## 2018-02-17 DIAGNOSIS — E119 Type 2 diabetes mellitus without complications: Secondary | ICD-10-CM | POA: Diagnosis not present

## 2018-02-17 DIAGNOSIS — K8020 Calculus of gallbladder without cholecystitis without obstruction: Secondary | ICD-10-CM | POA: Diagnosis not present

## 2018-02-17 DIAGNOSIS — R1011 Right upper quadrant pain: Secondary | ICD-10-CM

## 2018-02-17 LAB — HEMOGLOBIN A1C: HEMOGLOBIN A1C: 7.1 % — AB (ref 4.6–6.5)

## 2018-02-17 MED ORDER — SERTRALINE HCL 50 MG PO TABS: ORAL_TABLET | ORAL | 11 refills | Status: DC

## 2018-02-17 MED ORDER — ESOMEPRAZOLE MAGNESIUM 40 MG PO CPDR: 40.0000 mg | DELAYED_RELEASE_CAPSULE | Freq: Every day | ORAL | 2 refills | Status: DC

## 2018-02-17 MED ORDER — ALPRAZOLAM 0.5 MG PO TABS: 0.5000 mg | ORAL_TABLET | Freq: Every evening | ORAL | 5 refills | Status: DC | PRN

## 2018-02-17 NOTE — Progress Notes (Signed)
   Subjective:    Patient ID: Justin Herrera, male    DOB: 03/30/1951, 67 y.o.   MRN: [id number]  HPI Here for an episode yesterday of sharp epigastric and RUQ abdominal pains. He was awakened byu this pain around 4:00 yesterday morning. The night before his dinner consisted of prime rib and a baked potato. The pain was steady throughout most of the day but then eased up last night. He has several bout of nausea with it but he never vomited. No fever. No SOB. He says eating food had no effect on the pain. He tired TUMS with no relief. Today the pain is gone. He has been taking an OTC Nexium daily for about a year. His BMs have been normal.    Review of Systems  Constitutional: Negative.   Respiratory: Negative.   Cardiovascular: Negative.   Gastrointestinal: Positive for abdominal pain and nausea. Negative for abdominal distention, anal bleeding, blood in stool, constipation, diarrhea, rectal pain and vomiting.  Genitourinary: Negative.        Objective:   Physical Exam  Constitutional: He is oriented to person, place, and time. He appears well-developed and well-nourished. No distress.  Eyes: No scleral icterus.  Cardiovascular: Normal rate, regular rhythm, normal heart sounds and intact distal pulses.  Pulmonary/Chest: Effort normal and breath sounds normal. No stridor. No respiratory distress. He has no wheezes. He has no rales.  Abdominal: Soft. Bowel sounds are normal. He exhibits no distension and no mass. There is no rebound and no guarding. No hernia.  Slightly tender in the RUQ, no HSM   Neurological: He is alert and oriented to person, place, and time.          Assessment & Plan:  RUQ pain, very suggestive of gall bladder pain. We will set him up for an abdominal US. Increase the Nexium to 40 mg daily. Check an A1c today for the diabetes.  Alysia Penna, MD

## 2018-02-18 ENCOUNTER — Ambulatory Visit
Admission: RE | Admit: 2018-02-18 | Discharge: 2018-02-18 | Disposition: A | Discharge status: Home / Self Care | Payer: Medicare Other | Source: Ambulatory Visit | Attending: Family Medicine | Admitting: Family Medicine

## 2018-02-18 DIAGNOSIS — R1011 Right upper quadrant pain: Secondary | ICD-10-CM

## 2018-02-18 NOTE — Addendum Note (Signed)
Addended by: Alysia Penna A on: 02/18/2018 12:25 PM   Modules accepted: Orders

## 2018-02-26 ENCOUNTER — Telehealth: Payer: Self-pay | Admitting: Family Medicine

## 2018-02-26 NOTE — Telephone Encounter (Signed)
Documented in result note.  

## 2018-02-26 NOTE — Telephone Encounter (Unsigned)
Copied from [place] [id number]. Topic: General - Other >> Feb 26, 2018 11:43 AM Yvette Rack wrote: Reason for CRM: Pt returned call for lab results. Pt requests a call back. Cb# [phone]

## 2018-03-20 NOTE — Progress Notes (Signed)
Cardiology Office Note   Date:  03/21/2018   ID:  Justin Herrera, DOB 11-26-1950, MRN [id number]  PCP:  Laurey Morale, MD  Cardiologist:   Minus Breeding, MD   Chief Complaint  Patient presents with  . Heart Murmur      History of Present Illness: Justin Herrera is a 67 y.o. male who presents for follow up of an abnormal stress perfusion study in 2016 which suggested that his EF was perhaps about 48%. There was a medium defect of moderate severity in the basal inferior, basal inferolateral and inferolateral location with question peri-infarct ischemia. The patient was able to walk 8 minutes and it took a while to get his heart rate above 85% on the stress test.  He said he didn't have any symptoms.   In December of that year I sent him for an echocardiogram.  There was slight LV thickening but nothing that was clinically concerning. The LV did not show any evidence of a previous inferior infarct.  I managed him medically.    Since I last saw her she has done well.  He exercises routinely.  The patient denies any new symptoms such as chest discomfort, neck or arm discomfort. There has been no new shortness of breath, PND or orthopnea. There have been no reported palpitations, presyncope or syncope.   He is going to have a cholecystectomy soon as he is had some mild abdominal pain and was found to have gallstones.   Past Medical History:  Diagnosis Date  . Anxiety   . Arthritis   . Diabetes mellitus   . GERD (gastroesophageal reflux disease)   . Hypertension   . Insomnia   . Sleep apnea, obstructive    wears a CPAP    Past Surgical History:  Procedure Laterality Date  . COLONOSCOPY  11-20-13   per Dr. Fuller Plan, adenomatous polyps, repeat in 5 yrs   . CYSTECTOMY Right 2014   right wrist  . KNEE SURGERY  1968     Current Outpatient Medications  Medication Sig Dispense Refill  . acetaminophen (TYLENOL) 500 MG tablet Take 1,000 mg by mouth daily as needed for  moderate pain or headache.    . ALPRAZolam (XANAX) 0.5 MG tablet Take 1 tablet (0.5 mg total) by mouth at bedtime as needed for anxiety. 30 tablet 5  . amLODipine-olmesartan (AZOR) 10-40 MG tablet TAKE 1 TABLET BY MOUTH EVERY DAY 90 tablet 2  . aspirin 81 MG tablet Take 81 mg by mouth daily.      . diclofenac (VOLTAREN) 75 MG EC tablet Take 1 tablet (75 mg total) by mouth 2 (two) times daily. (Patient taking differently: Take 75 mg by mouth daily. ) 180 tablet 3  . Diclofenac Sodium (PENNSAID) 2 % SOLN Place 1 application onto the skin daily as needed (knee pain).    Marland Kitchen esomeprazole (NEXIUM) 40 MG capsule Take 1 capsule (40 mg total) by mouth daily. 30 capsule 2  . glipiZIDE (GLUCOTROL) 10 MG tablet TAKE 1 TABLET BY MOUTH TWICE DAILY BEFORE A MEAL 180 tablet 2  . glucose blood (ONE TOUCH ULTRA TEST) test strip Once a day 100 each 2  . Lancets (ONETOUCH ULTRASOFT) lancets Once a day 100 each 2  . metFORMIN (GLUCOPHAGE) 500 MG tablet TAKE 1 TABLET BY MOUTH TWICE DAILY WITH MEALS 180 tablet 2  . metoprolol succinate (TOPROL-XL) 50 MG 24 hr tablet TAKE 1 TABLET BY MOUTH EVERY DAY WITH OR IMMEDIATELY FOLLOWING A MEAL.  90 tablet 2  . sertraline (ZOLOFT) 50 MG tablet TAKE 1 TABLET(50 MG) BY MOUTH DAILY 30 tablet 11   No current facility-administered medications for this visit.     Allergies:   Patient has no known allergies.    ROS:  Please see the history of present illness.   Otherwise, review of systems are positive for none.   All other systems are reviewed and negative.    PHYSICAL EXAM: VS:  BP 132/78   Pulse 71   Ht 5\' 11"  (1.803 m)   Wt 231 lb (104.8 kg)   BMI 32.22 kg/m  , BMI Body mass index is 32.22 kg/m.  GENERAL:  Well appearing NECK:  No jugular venous distention, waveform within normal limits, carotid upstroke brisk and symmetric, no bruits, no thyromegaly LUNGS:  Clear to auscultation bilaterally CHEST:  Unremarkable HEART:  PMI not displaced or sustained,S1 and S2 within  normal limits, no S3, no S4, no clicks, no rubs, no murmurs ABD:  Flat, positive bowel sounds normal in frequency in pitch, no bruits, no rebound, no guarding, no midline pulsatile mass, no hepatomegaly, no splenomegaly EXT:  2 plus pulses throughout, no edema, no cyanosis no clubbing   EKG:  EKG is ordered today. The ekg ordered  demonstrates sinus rhythm, rate 71, early transition in lead V2, no acute ST-T wave changes   Recent Labs: 08/14/2017: ALT 31; BUN 16; Creatinine, Ser 1.08; Hemoglobin 16.1; Platelets 241.0; Potassium 4.3; Sodium 140; TSH 1.66    Lipid Panel    Component Value Date/Time   CHOL 154 08/14/2017 1445   TRIG 124.0 08/14/2017 1445   HDL 32.50 (L) 08/14/2017 1445   CHOLHDL 5 08/14/2017 1445   VLDL 24.8 08/14/2017 1445   LDLCALC 97 08/14/2017 1445   LDLDIRECT 109.1 03/29/2010 0804     Lab Results  Component Value Date   HGBA1C 7.1 (H) 02/17/2018    Wt Readings from Last 3 Encounters:  03/21/18 231 lb (104.8 kg)  02/17/18 233 lb 6.4 oz (105.9 kg)  09/12/17 236 lb 9.6 oz (107.3 kg)      Other studies Reviewed: Additional studies/ records that were reviewed today include: None Review of the above records demonstrates:    ASSESSMENT AND PLAN:  ABNORMAL STRESS TEST:     The patient has had a study that was not high risk.  He is not had any symptoms.  He is very active exercising routinely.  Given this no further testing is indicated.  He needs continued risk reduction.  MURMUR:     He has a soft systolic murmur.  There is some mild basal hypertrophy which may be causing SAM and the murmur.  However, in the absence of high risk findings or symptoms no further testing is indicated at this point.   OVERWEIGHT:     He has continued to lose weight with diet and exercise.  Courage continued primary risk reduction.  PREOP:   Patient has no high risk findings or symptoms.  No high risk abnormalities on stress testing previously.  High functional level.  He  would not be going for high risk procedure.  Therefore, according to ACC/AHA guidelines patient is at acceptable risk for the planned procedure.   Current medicines are reviewed at length with the patient today.  The patient does not have concerns regarding medicines.  The following changes have been made:  None  Labs/ tests ordered today include: None  No orders of the defined types were placed in this  encounter.    Disposition:   FU with me in 24 months.     Signed, Minus Breeding, MD  03/21/2018 2:27 PM    [facility]

## 2018-03-21 ENCOUNTER — Encounter: Payer: Self-pay | Admitting: Cardiology

## 2018-03-21 ENCOUNTER — Ambulatory Visit: Payer: Medicare Other | Admitting: Cardiology

## 2018-03-21 VITALS — BP 132/78 | HR 71 | Ht 71.0 in | Wt 231.0 lb

## 2018-03-21 DIAGNOSIS — R011 Cardiac murmur, unspecified: Secondary | ICD-10-CM

## 2018-03-21 DIAGNOSIS — R9439 Abnormal result of other cardiovascular function study: Secondary | ICD-10-CM

## 2018-03-21 DIAGNOSIS — Z01810 Encounter for preprocedural cardiovascular examination: Secondary | ICD-10-CM

## 2018-03-21 NOTE — Patient Instructions (Signed)
Medication Instructions:  NO CHANGES- Your physician recommends that you continue on your current medications as directed. Please refer to the Current Medication list given to you today.  If you need a refill on your cardiac medications before your next appointment, please call your pharmacy.  Follow-Up: Your physician wants you to follow-up in: [address] [place].    Thank you for choosing [facility] at [facility]!!

## 2018-03-24 ENCOUNTER — Ambulatory Visit: Payer: Self-pay | Admitting: General Surgery

## 2018-03-24 NOTE — H&P (Signed)
[name] Documented: 03/20/2018 1:51 PM Location: [facility] Patient #: [id number] DOB: June 04, 1951 Married / Language: [name] / Race: White Male   History of Present Illness Randall Hiss M. Delrae Hagey MD; 03/20/2018 2:34 PM) The patient is a 67 year old male who presents for evaluation of gall stones. He is referred by Dr Alysia Penna for evaluation of gallstones. He reports about 1 month ago he developed sharp pain in his upper abdomen rating to his right side and right shoulder. It lasted up to 12 hours. He had not experienced anything like that before. He went to see his primary care physician the next day he ordered an abdominal ultrasound which showed gallstones and fatty infiltration of the liver. He states he has not had any additional episodes since then. He does have GERD but states this pain was different. He typically will have heartburn. He had prime rib and a baked potato the night before he developed the right upper quadrant pain. There are no aggravating or relieving factor since he only had one episode. He denies any weight loss. He denies any melena or hematochezia. He denies any acholic stools. He denies any prior abdominal surgery. He denies any chest pain, chest pressure, chest tightness, angina, orthopnea. 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 Vasectomy   Diagnostic Studies History Alean Rinne, [place]; 03/20/2018 1:51 PM) Colonoscopy  1-5 years ago  Allergies Alean Rinne, [place]; 03/20/2018 1:52 PM) No Known Drug Allergies [03/20/2018]: Allergies Reconciled   Medication History Alean Rinne, [place]; 03/20/2018 1:53 PM) ALPRAZolam (0.5MG  Tablet, Oral) Active. Esomeprazole Magnesium (40MG  Capsule DR, Oral) Active. GlipiZIDE (10MG  Tablet,  Oral) Active. MetFORMIN HCl (500MG  Tablet, Oral) Active. Metoprolol Succinate ER (50MG  Tablet ER 24HR, Oral) Active. Sertraline HCl (50MG  Tablet, Oral) Active. Amlodipine-Olmesartan (10-40MG  Tablet, Oral) Active. Aspirin (81MG  Tablet DR, Oral) Active. Voltaren (75MG  Tablet DR, Oral) Active. Medications Reconciled  Social History Alean Rinne, [place]; 03/20/2018 1:51 PM) Alcohol use  Occasional alcohol use. Caffeine use  Carbonated beverages, Tea. No drug use  Tobacco use  Never smoker.  Family History Alean Rinne, [place]; 03/20/2018 1:51 PM) Breast Cancer  Sister. Cerebrovascular Accident  Mother. Hypertension  Mother. Melanoma  Father.  Other Problems Randall Hiss M. Redmond Pulling, MD; 03/20/2018 2:35 PM) Back Pain  Heart murmur  Hemorrhoids  Sleep Apnea  High blood pressure  Diabetes Mellitus  Gastroesophageal Reflux Disease     Review of Systems Alean Rinne RMA; 03/20/2018 1:51 PM) General Not Present- Appetite Loss, Chills, Fatigue, Fever, Night Sweats, Weight Gain and Weight Loss. Skin Not Present- Change in Wart/Mole, Dryness, Hives, Jaundice, New Lesions, Non-Healing Wounds, Rash and Ulcer. HEENT Present- Ringing in the Ears and Wears glasses/contact lenses. Not Present- Earache, Hearing Loss, Hoarseness, Nose Bleed, Oral Ulcers, Seasonal Allergies, Sinus Pain, Sore Throat, Visual Disturbances and Yellow Eyes. Respiratory Present- Snoring. Not Present- Bloody sputum, Chronic Cough, Difficulty Breathing and Wheezing. Breast Not Present- Breast Mass, Breast Pain, Nipple Discharge and Skin Changes. Cardiovascular Not Present- Chest Pain, Difficulty Breathing Lying Down, Leg Cramps, Palpitations, Rapid Heart Rate, Shortness of Breath and Swelling of Extremities. Gastrointestinal Present- Hemorrhoids and Indigestion. Not Present- Abdominal Pain, Bloating, Bloody Stool, Change in Bowel Habits, Chronic diarrhea, Constipation, Difficulty Swallowing, Excessive gas, Gets  full quickly at meals, Nausea, Rectal Pain and Vomiting. Male Genitourinary Not Present- Blood in Urine,  Change in Urinary Stream, Frequency, Impotence, Nocturia, Painful Urination, Urgency and Urine Leakage. Musculoskeletal Present- Back Pain and Joint Pain. Not Present- Joint Stiffness, Muscle Pain, Muscle Weakness and Swelling of Extremities. Neurological Not Present- Decreased Memory, Fainting, Headaches, Numbness, Seizures, Tingling, Tremor, Trouble walking and Weakness. Psychiatric Not Present- Anxiety, Bipolar, Change in Sleep Pattern, Depression, Fearful and Frequent crying. Endocrine Not Present- Cold Intolerance, Excessive Hunger, Hair Changes, Heat Intolerance, Hot flashes and New Diabetes. Hematology Not Present- Blood Thinners, Easy Bruising, Excessive bleeding, Gland problems, HIV and Persistent Infections.  Vitals Mardene Celeste King RMA; 03/20/2018 1:52 PM) 03/20/2018 1:52 PM Weight: 236.2 lb Height: 71in Body Surface Area: 2.26 m Body Mass Index: 32.94 kg/m  Temp.: 99.71F  Pulse: 98 (Regular)  BP: 135/98 (Sitting, Left Arm, Standard)       Physical Exam Randall Hiss M. Mandi Mattioli MD; 03/20/2018 2:32 PM) General Mental Status-Alert. General Appearance-Consistent with stated age. Hydration-Well hydrated. Voice-Normal. Note: mild class 1 obesity   Head and Neck Head-normocephalic, atraumatic with no lesions or palpable masses. Trachea-midline. Thyroid Gland Characteristics - normal size and consistency.  Eye Eyeball - Bilateral-Extraocular movements intact. Sclera/Conjunctiva - Bilateral-No scleral icterus.  ENMT Ears -Note: normal ext ears.  Mouth and Throat -Note: lips intact.   Chest and Lung Exam Chest and lung exam reveals -quiet, even and easy respiratory effort with no use of accessory muscles and on auscultation, normal breath sounds, no adventitious sounds and normal vocal resonance. Inspection Chest Wall - Normal. Back -  normal.  Breast - Did not examine.  Cardiovascular Cardiovascular examination reveals -normal heart sounds, regular rate and rhythm with no murmurs and normal pedal pulses bilaterally.  Abdomen Inspection Inspection of the abdomen reveals - No Hernias. Skin - Scar - no surgical scars. Palpation/Percussion Palpation and Percussion of the abdomen reveal - Soft, Non Tender, No Rebound tenderness, No Rigidity (guarding) and No hepatosplenomegaly. Auscultation Auscultation of the abdomen reveals - Bowel sounds normal.  Peripheral Vascular Upper Extremity Palpation - Pulses bilaterally normal.  Neurologic Neurologic evaluation reveals -alert and oriented x 3 with no impairment of recent or remote memory. Mental Status-Normal.  Neuropsychiatric The patient's mood and affect are described as -normal. Judgment and Insight-insight is appropriate concerning matters relevant to self.  Musculoskeletal Normal Exam - Left-Upper Extremity Strength Normal and Lower Extremity Strength Normal. Normal Exam - Right-Upper Extremity Strength Normal and Lower Extremity Strength Normal.  Lymphatic Head & Neck  General Head & Neck Lymphatics: Bilateral - Description - Normal. Axillary - Did not examine. Femoral & Inguinal - Did not examine.    Assessment & Plan Randall Hiss M. Deysy Schabel MD; 03/20/2018 2:35 PM) SYMPTOMATIC CHOLELITHIASIS (K80.20) Impression: I believe the patient's symptoms are consistent with gallbladder disease.  We discussed gallbladder disease. The patient was given Neurosurgeon. We discussed non-operative and operative management. We discussed the signs & symptoms of acute cholecystitis  I discussed laparoscopic cholecystectomy with IOC in detail. The patient was given educational material as well as diagrams detailing the procedure. We discussed the risks and benefits of a laparoscopic cholecystectomy including, but not limited to bleeding, infection, injury to  surrounding structures such as the intestine or liver, bile leak, retained gallstones, need to convert to an open procedure, prolonged diarrhea, blood clots such as DVT, common bile duct injury, anesthesia risks, and possible need for additional procedures. We discussed the typical post-operative recovery course. I explained that the likelihood of improvement of their symptoms is good.  The patient is going to talk with our surgery scheduler and  probably proceed with gb surgery Current Plans Pt Education - Pamphlet Given - Laparoscopic Gallbladder Surgery: discussed with patient and provided information. DIABETES MELLITUS TYPE 2 IN OBESE (E11.69) CHRONIC GERD (K21.9) HYPERTENSION, ESSENTIAL (I10)  Leighton Ruff. Redmond Pulling, MD, FACS General, Bariatric, & Minimally Invasive Surgery [facility], [place]

## 2018-03-24 NOTE — H&P (View-Only) (Signed)
Justin Herrera Documented: 03/20/2018 1:51 PM Location: [facility] Patient #: [id number] DOB: June 07, 1951 Married / Language: Justin Herrera / Race: White Male   History of Present Illness Justin Herrera M. Justin Seddon MD; 03/20/2018 2:34 PM) The patient is a 67 year old male who presents for evaluation of gall stones. He is referred by Dr Justin Herrera for evaluation of gallstones. He reports about 1 month ago he developed sharp pain in his upper abdomen rating to his right side and right shoulder. It lasted up to 12 hours. He had not experienced anything like that before. He went to see his primary care physician the next day he ordered an abdominal ultrasound which showed gallstones and fatty infiltration of the liver. He states he has not had any additional episodes since then. He does have GERD but states this pain was different. He typically will have heartburn. He had prime rib and a baked potato the night before he developed the right upper quadrant pain. There are no aggravating or relieving factor since he only had one episode. He denies any weight loss. He denies any melena or hematochezia. He denies any acholic stools. He denies any prior abdominal surgery. He denies any chest pain, chest pressure, chest tightness, angina, orthopnea. 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 Vasectomy   Diagnostic Studies History Justin Herrera, [place]; 03/20/2018 1:51 PM) Colonoscopy  1-5 years ago  Allergies Justin Herrera, [place]; 03/20/2018 1:52 PM) No Known Drug Allergies [03/20/2018]: Allergies Reconciled   Medication History Justin Herrera, [place]; 03/20/2018 1:53 PM) ALPRAZolam (0.5MG  Tablet, Oral) Active. Esomeprazole Magnesium (40MG  Capsule DR, Oral) Active. GlipiZIDE (10MG  Tablet,  Oral) Active. MetFORMIN HCl (500MG  Tablet, Oral) Active. Metoprolol Succinate ER (50MG  Tablet ER 24HR, Oral) Active. Sertraline HCl (50MG  Tablet, Oral) Active. Amlodipine-Olmesartan (10-40MG  Tablet, Oral) Active. Aspirin (81MG  Tablet DR, Oral) Active. Voltaren (75MG  Tablet DR, Oral) Active. Medications Reconciled  Social History Justin Herrera, [place]; 03/20/2018 1:51 PM) Alcohol use  Occasional alcohol use. Caffeine use  Carbonated beverages, Tea. No drug use  Tobacco use  Never smoker.  Family History Justin Herrera, [place]; 03/20/2018 1:51 PM) Breast Cancer  Sister. Cerebrovascular Accident  Mother. Hypertension  Mother. Melanoma  Father.  Other Problems Justin Herrera M. Redmond Pulling, MD; 03/20/2018 2:35 PM) Back Pain  Heart murmur  Hemorrhoids  Sleep Apnea  High blood pressure  Diabetes Mellitus  Gastroesophageal Reflux Disease     Review of Systems Justin Herrera; 03/20/2018 1:51 PM) General Not Present- Appetite Loss, Chills, Fatigue, Fever, Night Sweats, Weight Gain and Weight Loss. Skin Not Present- Change in Wart/Mole, Dryness, Hives, Jaundice, New Lesions, Non-Healing Wounds, Rash and Ulcer. HEENT Present- Ringing in the Ears and Wears glasses/contact lenses. Not Present- Earache, Hearing Loss, Hoarseness, Nose Bleed, Oral Ulcers, Seasonal Allergies, Sinus Pain, Sore Throat, Visual Disturbances and Yellow Eyes. Respiratory Present- Snoring. Not Present- Bloody sputum, Chronic Cough, Difficulty Breathing and Wheezing. Breast Not Present- Breast Mass, Breast Pain, Nipple Discharge and Skin Changes. Cardiovascular Not Present- Chest Pain, Difficulty Breathing Lying Down, Leg Cramps, Palpitations, Rapid Heart Rate, Shortness of Breath and Swelling of Extremities. Gastrointestinal Present- Hemorrhoids and Indigestion. Not Present- Abdominal Pain, Bloating, Bloody Stool, Change in Bowel Habits, Chronic diarrhea, Constipation, Difficulty Swallowing, Excessive gas, Gets  full quickly at meals, Nausea, Rectal Pain and Vomiting. Male Genitourinary Not Present- Blood in Urine,  Change in Urinary Stream, Frequency, Impotence, Nocturia, Painful Urination, Urgency and Urine Leakage. Musculoskeletal Present- Back Pain and Joint Pain. Not Present- Joint Stiffness, Muscle Pain, Muscle Weakness and Swelling of Extremities. Neurological Not Present- Decreased Memory, Fainting, Headaches, Numbness, Seizures, Tingling, Tremor, Trouble walking and Weakness. Psychiatric Not Present- Anxiety, Bipolar, Change in Sleep Pattern, Depression, Fearful and Frequent crying. Endocrine Not Present- Cold Intolerance, Excessive Hunger, Hair Changes, Heat Intolerance, Hot flashes and New Diabetes. Hematology Not Present- Blood Thinners, Easy Bruising, Excessive bleeding, Gland problems, HIV and Persistent Infections.  Vitals Justin Herrera Herrera; 03/20/2018 1:52 PM) 03/20/2018 1:52 PM Weight: 236.2 lb Height: 71in Body Surface Area: 2.26 m Body Mass Index: 32.94 kg/m  Temp.: 99.65F  Pulse: 98 (Regular)  BP: 135/98 (Sitting, Left Arm, Standard)       Physical Exam Justin Herrera M. Justin Fleet MD; 03/20/2018 2:32 PM) General Mental Status-Alert. General Appearance-Consistent with stated age. Hydration-Well hydrated. Voice-Normal. Note: mild class 1 obesity   Head and Neck Head-normocephalic, atraumatic with no lesions or palpable masses. Trachea-midline. Thyroid Gland Characteristics - normal size and consistency.  Eye Eyeball - Bilateral-Extraocular movements intact. Sclera/Conjunctiva - Bilateral-No scleral icterus.  ENMT Ears -Note: normal ext ears.  Mouth and Throat -Note: lips intact.   Chest and Lung Exam Chest and lung exam reveals -quiet, even and easy respiratory effort with no use of accessory muscles and on auscultation, normal breath sounds, no adventitious sounds and normal vocal resonance. Inspection Chest Wall - Normal. Back -  normal.  Breast - Did not examine.  Cardiovascular Cardiovascular examination reveals -normal heart sounds, regular rate and rhythm with no murmurs and normal pedal pulses bilaterally.  Abdomen Inspection Inspection of the abdomen reveals - No Hernias. Skin - Scar - no surgical scars. Palpation/Percussion Palpation and Percussion of the abdomen reveal - Soft, Non Tender, No Rebound tenderness, No Rigidity (guarding) and No hepatosplenomegaly. Auscultation Auscultation of the abdomen reveals - Bowel sounds normal.  Peripheral Vascular Upper Extremity Palpation - Pulses bilaterally normal.  Neurologic Neurologic evaluation reveals -alert and oriented x 3 with no impairment of recent or remote memory. Mental Status-Normal.  Neuropsychiatric The patient's mood and affect are described as -normal. Judgment and Insight-insight is appropriate concerning matters relevant to self.  Musculoskeletal Normal Exam - Left-Upper Extremity Strength Normal and Lower Extremity Strength Normal. Normal Exam - Right-Upper Extremity Strength Normal and Lower Extremity Strength Normal.  Lymphatic Head & Neck  General Head & Neck Lymphatics: Bilateral - Description - Normal. Axillary - Did not examine. Femoral & Inguinal - Did not examine.    Assessment & Plan Justin Herrera M. Kaydin Karbowski MD; 03/20/2018 2:35 PM) SYMPTOMATIC CHOLELITHIASIS (K80.20) Impression: I believe the patient's symptoms are consistent with gallbladder disease.  We discussed gallbladder disease. The patient was given Neurosurgeon. We discussed non-operative and operative management. We discussed the signs & symptoms of acute cholecystitis  I discussed laparoscopic cholecystectomy with IOC in detail. The patient was given educational material as well as diagrams detailing the procedure. We discussed the risks and benefits of a laparoscopic cholecystectomy including, but not limited to bleeding, infection, injury to  surrounding structures such as the intestine or liver, bile leak, retained gallstones, need to convert to an open procedure, prolonged diarrhea, blood clots such as DVT, common bile duct injury, anesthesia risks, and possible need for additional procedures. We discussed the typical post-operative recovery course. I explained that the likelihood of improvement of their symptoms is good.  The patient is going to talk with our surgery scheduler and  probably proceed with gb surgery Current Plans Pt Education - Pamphlet Given - Laparoscopic Gallbladder Surgery: discussed with patient and provided information. DIABETES MELLITUS TYPE 2 IN OBESE (E11.69) CHRONIC GERD (K21.9) HYPERTENSION, ESSENTIAL (I10)  Leighton Ruff. Redmond Pulling, MD, FACS General, Bariatric, & Minimally Invasive Surgery [facility], [place]

## 2018-03-25 NOTE — Pre-Procedure Instructions (Signed)
Justin Herrera  03/25/2018      [facility] DR[facility] STORE [place], [place] AT [facility] OF [address] & [address] [place] [address] Phone: [phone] Fax: [phone]    Your procedure is scheduled on Tuesday, August 2nd.  Report to [facility] Admitting at 7:30 A.M.  Call this number if you have problems the morning of surgery:  [phone]   Remember:  Do not eat or drink after midnight on Monday.     Take these medicines the morning of surgery with A SIP OF WATER  esomeprazole (NEXIUM) 40 MG capsule metoprolol succinate (TOPROL-XL) 50 MG 24 hr tablet sertraline (ZOLOFT) 50 MG tablet Tylenol-as needed  7 days prior to surgery STOP taking any Aspirin(unless otherwise instructed by your surgeon), diclofenac (VOLTAREN), Aleve, Naproxen, Ibuprofen, Motrin, Advil, Goody's, BC's, all herbal medications, fish oil, and all vitamins  Follow your surgeon's instructions on when to stop Asprin.  If no instructions were given by your surgeon then you will need to call the office to get those instructions.    WHAT DO I DO ABOUT MY DIABETES MEDICATION?   Marland Kitchen Do not take oral diabetes medicines (pills) the morning of surgery.  Glipizide (Glucotrol) or metFORMIN (glucophage) DO NOT TAKE!  Marland Kitchen The day of surgery, do not take other diabetes injectables, including Byetta (exenatide), Bydureon (exenatide ER), Victoza (liraglutide), or Trulicity (dulaglutide).  . If your CBG is greater than 220 mg/dL, you may take  of your sliding scale (correction) dose of insulin.   How to Manage Your Diabetes Before and After Surgery  Why is it important to control my blood sugar before and after surgery? . Improving blood sugar levels before and after surgery helps healing and can limit problems. . A way of improving blood sugar control is eating a healthy diet by: o  Eating less sugar and carbohydrates o  Increasing activity/exercise o  Talking with  your doctor about reaching your blood sugar goals . High blood sugars (greater than 180 mg/dL) can raise your risk of infections and slow your recovery, so you will need to focus on controlling your diabetes during the weeks before surgery. . Make sure that the doctor who takes care of your diabetes knows about your planned surgery including the date and location.  How do I manage my blood sugar before surgery? . Check your blood sugar at least 4 times a day, starting 2 days before surgery, to make sure that the level is not too high or low. o Check your blood sugar the morning of your surgery when you wake up and every 2 hours until you get to the Short Stay unit. . If your blood sugar is less than 70 mg/dL, you will need to treat for low blood sugar: o Do not take insulin. o Treat a low blood sugar (less than 70 mg/dL) with  cup of clear juice (cranberry or apple), 4 glucose tablets, OR glucose gel. o Recheck blood sugar in 15 minutes after treatment (to make sure it is greater than 70 mg/dL). If your blood sugar is not greater than 70 mg/dL on recheck, call [phone] for further instructions. . Report your blood sugar to the short stay nurse when you get to Short Stay.  . If you are admitted to the hospital after surgery: o Your blood sugar will be checked by the staff and you will probably be given insulin after surgery (instead of  oral diabetes medicines) to make sure you have good blood sugar levels. o The goal for blood sugar control after surgery is 80-180 mg/dL.    Do not wear jewelry  Do not wear lotions, powders, or colognes, or deodorant.  Men may shave face and neck.  Do not bring valuables to the hospital.  [facility] is not responsible for any belongings or valuables.  Contacts, dentures or bridgework may not be worn into surgery.  Leave your suitcase in the car.  After surgery it may be brought to your room.  For patients admitted to the hospital, discharge time will be  determined by your treatment team.  Patients discharged the day of surgery will not be allowed to drive home.   Special instructions:   [place]- Preparing For Surgery  Before surgery, you can play an important role. Because skin is not sterile, your skin needs to be as free of germs as possible. You can reduce the number of germs on your skin by washing with CHG (chlorahexidine gluconate) Soap before surgery.  CHG is an antiseptic cleaner which kills germs and bonds with the skin to continue killing germs even after washing.    Oral Hygiene is also important to reduce your risk of infection.  Remember - BRUSH YOUR TEETH THE MORNING OF SURGERY WITH YOUR REGULAR TOOTHPASTE  Please do not use if you have an allergy to CHG or antibacterial soaps. If your skin becomes reddened/irritated stop using the CHG.  Do not shave (including legs and underarms) for at least 48 hours prior to first CHG shower. It is OK to shave your face.  Please follow these instructions carefully.   1. Shower the NIGHT BEFORE SURGERY and the MORNING OF SURGERY with CHG.   2. If you chose to wash your hair, wash your hair first as usual with your normal shampoo.  3. After you shampoo, rinse your hair and body thoroughly to remove the shampoo.  4. Use CHG as you would any other liquid soap. You can apply CHG directly to the skin and wash gently with a scrungie or a clean washcloth.   5. Apply the CHG Soap to your body ONLY FROM THE NECK DOWN.  Do not use on open wounds or open sores. Avoid contact with your eyes, ears, mouth and genitals (private parts). Wash Face and genitals (private parts)  with your normal soap.  6. Wash thoroughly, paying special attention to the area where your surgery will be performed.  7. Thoroughly rinse your body with warm water from the neck down.  8. DO NOT shower/wash with your normal soap after using and rinsing off the CHG Soap.  9. Pat yourself dry with a CLEAN TOWEL.  10. Wear  CLEAN PAJAMAS to bed the night before surgery, wear comfortable clothes the morning of surgery  11. Place CLEAN SHEETS on your bed the night of your first shower and DO NOT SLEEP WITH PETS.    Day of Surgery:  Do not apply any deodorants/lotions.  Please wear clean clothes to the hospital/surgery center.   Remember to brush your teeth WITH YOUR REGULAR TOOTHPASTE.   Please read over the following fact sheets that you were given.

## 2018-03-26 ENCOUNTER — Encounter (HOSPITAL_COMMUNITY): Payer: Self-pay

## 2018-03-26 ENCOUNTER — Other Ambulatory Visit: Payer: Self-pay

## 2018-03-26 ENCOUNTER — Encounter (HOSPITAL_COMMUNITY)
Admission: RE | Admit: 2018-03-26 | Discharge: 2018-03-26 | Disposition: A | Discharge status: Home / Self Care | Payer: Medicare Other | Source: Ambulatory Visit | Attending: General Surgery | Admitting: General Surgery

## 2018-03-26 DIAGNOSIS — E119 Type 2 diabetes mellitus without complications: Secondary | ICD-10-CM | POA: Diagnosis not present

## 2018-03-26 DIAGNOSIS — Z7982 Long term (current) use of aspirin: Secondary | ICD-10-CM | POA: Diagnosis not present

## 2018-03-26 DIAGNOSIS — I10 Essential (primary) hypertension: Secondary | ICD-10-CM | POA: Diagnosis not present

## 2018-03-26 DIAGNOSIS — F419 Anxiety disorder, unspecified: Secondary | ICD-10-CM | POA: Diagnosis not present

## 2018-03-26 DIAGNOSIS — Z7984 Long term (current) use of oral hypoglycemic drugs: Secondary | ICD-10-CM | POA: Diagnosis not present

## 2018-03-26 DIAGNOSIS — Z79899 Other long term (current) drug therapy: Secondary | ICD-10-CM | POA: Diagnosis not present

## 2018-03-26 DIAGNOSIS — K8010 Calculus of gallbladder with chronic cholecystitis without obstruction: Secondary | ICD-10-CM | POA: Diagnosis not present

## 2018-03-26 DIAGNOSIS — K219 Gastro-esophageal reflux disease without esophagitis: Secondary | ICD-10-CM | POA: Diagnosis not present

## 2018-03-26 DIAGNOSIS — G4730 Sleep apnea, unspecified: Secondary | ICD-10-CM | POA: Diagnosis not present

## 2018-03-26 HISTORY — DX: Calculus of gallbladder without cholecystitis without obstruction: K80.20

## 2018-03-26 LAB — BASIC METABOLIC PANEL WITH GFR
Anion gap: 9 (ref 5–15)
BUN: 17 mg/dL (ref 8–23)
CO2: 22 mmol/L (ref 22–32)
Calcium: 9.1 mg/dL (ref 8.9–10.3)
Chloride: 107 mmol/L (ref 98–111)
Creatinine, Ser: 1.14 mg/dL (ref 0.61–1.24)
GFR calc Af Amer: >60 mL/min
GFR calc non Af Amer: >60 mL/min
Glucose, Bld: 173 mg/dL — ABNORMAL HIGH (ref 70–99)
Potassium: 4.2 mmol/L (ref 3.5–5.1)
Sodium: 138 mmol/L (ref 135–145)

## 2018-03-26 LAB — CBC
HCT: 42.7 % (ref 39.0–52.0)
Hemoglobin: 14.9 g/dL (ref 13.0–17.0)
MCH: 30 pg (ref 26.0–34.0)
MCHC: 34.9 g/dL (ref 30.0–36.0)
MCV: 86.1 fL (ref 78.0–100.0)
Platelets: 220 K/uL (ref 150–400)
RBC: 4.96 MIL/uL (ref 4.22–5.81)
RDW: 12.4 % (ref 11.5–15.5)
WBC: 6.8 K/uL (ref 4.0–10.5)

## 2018-03-26 LAB — GLUCOSE, CAPILLARY: Glucose-Capillary: 145 mg/dL — ABNORMAL HIGH (ref 70–99)

## 2018-03-26 NOTE — Progress Notes (Signed)
Justin Herrera            03/25/2018                          [facility] DR[facility] STORE [place], [place] AT [facility] OF [address] [place] [address] Phone: [phone] Fax: [phone]              Your procedure is scheduled on Tuesday, August 2nd.            Report to [facility] Admitting at 7:30 A.M.            Call this number if you have problems the morning of surgery:            [phone]             Remember:            Do not eat after midnight on Monday.            You may have clear liquids until 3 hours (6:30AM) prior to surgery. Clear Liquids consist of Water and Gatorade                Take these medicines the morning of surgery with A SIP OF WATER By 6:30AM:  esomeprazole (NEXIUM) metoprolol succinate (TOPROL-XL)  sertraline (ZOLOFT) Tylenol-as needed  As of today, STOP taking any Aspirin(unless otherwise instructed by your surgeon), diclofenac (VOLTAREN), Aleve, Naproxen, Ibuprofen, Motrin, Advil, Goody's, BC's, all herbal medications, fish oil, and all vitamins  Follow your surgeon's instructions on when to stop Aspirin.  If no instructions were given by your surgeon then you will need to call the office to get those instructions.    WHAT DO I DO ABOUT MY DIABETES MEDICATION?       Do not take Evening dose of GlipiZIDE (GLUCOTROL) the night before surgery.   Do not take Glipizide (Glucotrol) and MetFORMIN (glucophage) the morning of surgery.      HOW TO MANAGE YOUR DIABETES BEFORE AND AFTER SURGERY  Why is it important to control my blood sugar before and after surgery?  Improving blood sugar levels before and after surgery helps healing and can limit problems.  A way of improving blood sugar control is eating a healthy diet by: ?  Eating less sugar and carbohydrates ?  Increasing activity/exercise ?  Talking with your doctor about reaching your blood sugar goals  High blood  sugars (greater than 180 mg/dL) can raise your risk of infections and slow your recovery, so you will need to focus on controlling your diabetes during the weeks before surgery.  Make sure that the doctor who takes care of your diabetes knows about your planned surgery including the date and location.  How do I manage my blood sugar before surgery?  Check your blood sugar at least 4 times a day, starting 2 days before surgery, to make sure that the level is not too high or low. ? Check your blood sugar the morning of your surgery when you wake up and every 2 hours until you get to the Short Stay unit.  If your blood sugar is less than 70 mg/dL, you will need to treat for low blood sugar: ? Do not take insulin. ? Treat a low blood sugar (less than 70 mg/dL) with  cup of clear juice (cranberry or apple), 4glucose tablets, OR glucose gel. ?  Recheck blood sugar in 15 minutes after treatment (to make sure it is greater than 70 mg/dL). If your blood sugar is not greater than 70 mg/dL on recheck, call [phone] for further instructions.  Report your blood sugar to the short stay nurse when you get to Short Stay.   If your CBG is greater than 220 mg/dL, inform the staff upon arrival to Short Stay   If you are admitted to the hospital after surgery: ? Your blood sugar will be checked by the staff and you will probably be given insulin after surgery (instead of oral diabetes medicines) to make sure you have good blood sugar levels. ? The goal for blood sugar control after surgery is 80-180 mg/dL.             Do not wear jewelry.            Do not wear lotions, powders, or colognes, or deodorant.            Men may shave face and neck.            Do not bring valuables to the hospital.            [facility] is not responsible for any belongings or valuables.  Contacts, dentures or bridgework may not be worn into surgery.  Leave your suitcase in the car.  After surgery it may be brought to  your room.  For patients admitted to the hospital, discharge time will be determined by your treatment team.  Patients discharged the day of surgery will not be allowed to drive home.   Special instructions:   [facility]- Preparing For Surgery  Before surgery, you can play an important role. Because skin is not sterile, your skin needs to be as free of germs as possible. You can reduce the number of germs on your skin by washing with CHG (chlorahexidine gluconate) Soap before surgery.  CHG is an antiseptic cleaner which kills germs and bonds with the skin to continue killing germs even after washing.    Oral Hygiene is also important to reduce your risk of infection.  Remember - BRUSH YOUR TEETH THE MORNING OF SURGERY WITH YOUR REGULAR TOOTHPASTE  Please do not use if you have an allergy to CHG or antibacterial soaps. If your skin becomes reddened/irritated stop using the CHG.  Do not shave (including legs and underarms) for at least 48 hours prior to first CHG shower. It is OK to shave your face.  Please follow these instructions carefully.                                                                                                                     1. Shower the NIGHT BEFORE SURGERY and the MORNING OF SURGERY with CHG.   2. If you chose to wash your hair, wash your hair first as usual with your normal shampoo.  3. After you shampoo, rinse your hair and body thoroughly to remove the shampoo.  4.  Use CHG as you would any other liquid soap. You can apply CHG directly to the skin and wash gently with a scrungie or a clean washcloth.   5. Apply the CHG Soap to your body ONLY FROM THE NECK DOWN.  Do not use on open wounds or open sores. Avoid contact with your eyes, ears, mouth and genitals (private parts). Wash Face and genitals (private parts)  with your normal soap.  6. Wash thoroughly, paying special attention to the area where your surgery will be  performed.  7. Thoroughly rinse your body with warm water from the neck down.  8. DO NOT shower/wash with your normal soap after using and rinsing off the CHG Soap.  9. Pat yourself dry with a CLEAN TOWEL.  10. Wear CLEAN PAJAMAS to bed the night before surgery, wear comfortable clothes the morning of surgery  11. Place CLEAN SHEETS on your bed the night of your first shower and DO NOT SLEEP WITH PETS.  Day of Surgery:  Do not apply any deodorants/lotions.  Please wear clean clothes to the hospital/surgery center.   Remember to brush your teeth WITH YOUR REGULAR TOOTHPASTE  Please read over the following fact sheets that you were given.

## 2018-03-26 NOTE — Progress Notes (Signed)
PCP - Dr. Delma Freeze  Cardiologist - Dr. Minus Breeding- C.C. 7/26  Chest x-ray - Denies  EKG - 03/21/18 (E)  Stress Test - 07/15/15 (E)  ECHO - 08/16/18 (E)  Cardiac Cath - Denies  Sleep Study - Yes- Positive CPAP - Yes  LABS- 03/26/18: CBC, BMP  ASA- LD- 7/31  HA1C- 02/17/18: 7.1 Fasting Blood Sugar - 60-120, today 145 Checks Blood Sugar __1___ time a week  Anesthesia- No  Pt denies having chest pain, sob, or fever at this time. All instructions explained to the pt, with a verbal understanding of the material. Pt agrees to go over the instructions while at home for a better understanding. The opportunity to ask questions was provided.

## 2018-03-27 MED ORDER — GABAPENTIN 300 MG PO CAPS
300.0000 mg | ORAL_CAPSULE | ORAL | Status: AC
  Administered 2018-03-28: 300 mg via ORAL
  Filled 2018-03-27: qty 1

## 2018-03-27 MED ORDER — ACETAMINOPHEN 500 MG PO TABS
1000.0000 mg | ORAL_TABLET | ORAL | Status: AC
  Administered 2018-03-28: 1000 mg via ORAL
  Filled 2018-03-27: qty 2

## 2018-03-27 MED ORDER — CEFOTETAN DISODIUM 2 G IJ SOLR
2.0000 g | INTRAMUSCULAR | Status: AC
  Administered 2018-03-28: 2 g via INTRAVENOUS
  Filled 2018-03-27: qty 2

## 2018-03-28 ENCOUNTER — Other Ambulatory Visit: Payer: Self-pay

## 2018-03-28 ENCOUNTER — Encounter (HOSPITAL_COMMUNITY)
Admission: RE | Disposition: A | Discharge status: Home / Self Care | Payer: Self-pay | Source: Ambulatory Visit | Attending: General Surgery

## 2018-03-28 ENCOUNTER — Ambulatory Visit (HOSPITAL_COMMUNITY): Payer: Medicare Other | Admitting: Certified Registered"

## 2018-03-28 ENCOUNTER — Ambulatory Visit (HOSPITAL_COMMUNITY): Payer: Medicare Other

## 2018-03-28 ENCOUNTER — Ambulatory Visit (HOSPITAL_COMMUNITY)
Admission: RE | Admit: 2018-03-28 | Discharge: 2018-03-28 | Disposition: A | Discharge status: Home / Self Care | Payer: Medicare Other | Source: Ambulatory Visit | Attending: General Surgery | Admitting: General Surgery

## 2018-03-28 ENCOUNTER — Encounter (HOSPITAL_COMMUNITY): Payer: Self-pay

## 2018-03-28 DIAGNOSIS — Z7982 Long term (current) use of aspirin: Secondary | ICD-10-CM | POA: Insufficient documentation

## 2018-03-28 DIAGNOSIS — E119 Type 2 diabetes mellitus without complications: Secondary | ICD-10-CM | POA: Diagnosis not present

## 2018-03-28 DIAGNOSIS — Z419 Encounter for procedure for purposes other than remedying health state, unspecified: Secondary | ICD-10-CM

## 2018-03-28 DIAGNOSIS — Z7984 Long term (current) use of oral hypoglycemic drugs: Secondary | ICD-10-CM | POA: Insufficient documentation

## 2018-03-28 DIAGNOSIS — K8010 Calculus of gallbladder with chronic cholecystitis without obstruction: Secondary | ICD-10-CM | POA: Diagnosis not present

## 2018-03-28 DIAGNOSIS — K219 Gastro-esophageal reflux disease without esophagitis: Secondary | ICD-10-CM | POA: Insufficient documentation

## 2018-03-28 DIAGNOSIS — Z79899 Other long term (current) drug therapy: Secondary | ICD-10-CM | POA: Insufficient documentation

## 2018-03-28 DIAGNOSIS — I10 Essential (primary) hypertension: Secondary | ICD-10-CM | POA: Diagnosis not present

## 2018-03-28 DIAGNOSIS — G4730 Sleep apnea, unspecified: Secondary | ICD-10-CM | POA: Insufficient documentation

## 2018-03-28 DIAGNOSIS — F419 Anxiety disorder, unspecified: Secondary | ICD-10-CM | POA: Insufficient documentation

## 2018-03-28 HISTORY — PX: CHOLECYSTECTOMY: SHX55

## 2018-03-28 LAB — GLUCOSE, CAPILLARY
GLUCOSE-CAPILLARY: 188 mg/dL — AB (ref 70–99)
Glucose-Capillary: 139 mg/dL — ABNORMAL HIGH (ref 70–99)

## 2018-03-28 SURGERY — LAPAROSCOPIC CHOLECYSTECTOMY WITH INTRAOPERATIVE CHOLANGIOGRAM
Anesthesia: General | Site: Abdomen

## 2018-03-28 MED ORDER — LACTATED RINGERS IV SOLN
INTRAVENOUS | Status: DC | PRN
  Administered 2018-03-28 (×2): via INTRAVENOUS

## 2018-03-28 MED ORDER — MEPERIDINE HCL 50 MG/ML IJ SOLN: 6.2500 mg | INTRAMUSCULAR | Status: DC | PRN

## 2018-03-28 MED ORDER — CHLORHEXIDINE GLUCONATE 4 % EX LIQD: 60.0000 mL | Freq: Once | CUTANEOUS | Status: DC

## 2018-03-28 MED ORDER — PHENYLEPHRINE 40 MCG/ML (10ML) SYRINGE FOR IV PUSH (FOR BLOOD PRESSURE SUPPORT)
PREFILLED_SYRINGE | INTRAVENOUS | Status: DC | PRN
  Administered 2018-03-28: 10 ug via INTRAVENOUS
  Administered 2018-03-28: 80 ug via INTRAVENOUS

## 2018-03-28 MED ORDER — BUPIVACAINE-EPINEPHRINE 0.25% -1:200000 IJ SOLN
INTRAMUSCULAR | Status: DC | PRN
  Administered 2018-03-28: 30 mL

## 2018-03-28 MED ORDER — PROPOFOL 10 MG/ML IV BOLUS
INTRAVENOUS | Status: AC
  Filled 2018-03-28: qty 20

## 2018-03-28 MED ORDER — GLYCOPYRROLATE PF 0.2 MG/ML IJ SOSY
PREFILLED_SYRINGE | INTRAMUSCULAR | Status: DC | PRN
  Administered 2018-03-28: .1 mg via INTRAVENOUS
  Administered 2018-03-28: .2 mg via INTRAVENOUS
  Administered 2018-03-28: .1 mg via INTRAVENOUS

## 2018-03-28 MED ORDER — OXYCODONE HCL 5 MG PO TABS: 5.0000 mg | ORAL_TABLET | Freq: Four times a day (QID) | ORAL | 0 refills | Status: DC | PRN

## 2018-03-28 MED ORDER — PROPOFOL 10 MG/ML IV BOLUS
INTRAVENOUS | Status: DC | PRN
  Administered 2018-03-28: 150 mg via INTRAVENOUS

## 2018-03-28 MED ORDER — MIDAZOLAM HCL 5 MG/5ML IJ SOLN
INTRAMUSCULAR | Status: DC | PRN
  Administered 2018-03-28: 2 mg via INTRAVENOUS

## 2018-03-28 MED ORDER — HYDROMORPHONE HCL 1 MG/ML IJ SOLN: 0.2500 mg | INTRAMUSCULAR | Status: DC | PRN

## 2018-03-28 MED ORDER — IOPAMIDOL (ISOVUE-300) INJECTION 61%
INTRAVENOUS | Status: AC
  Filled 2018-03-28: qty 50

## 2018-03-28 MED ORDER — ONDANSETRON HCL 4 MG/2ML IJ SOLN: 4.0000 mg | Freq: Once | INTRAMUSCULAR | Status: DC | PRN

## 2018-03-28 MED ORDER — MIDAZOLAM HCL 2 MG/2ML IJ SOLN
INTRAMUSCULAR | Status: AC
  Filled 2018-03-28: qty 2

## 2018-03-28 MED ORDER — SODIUM CHLORIDE 0.9 % IR SOLN
Status: DC | PRN
  Administered 2018-03-28: 1000 mL

## 2018-03-28 MED ORDER — BUPIVACAINE-EPINEPHRINE (PF) 0.25% -1:200000 IJ SOLN
INTRAMUSCULAR | Status: AC
  Filled 2018-03-28: qty 30

## 2018-03-28 MED ORDER — FENTANYL CITRATE (PF) 250 MCG/5ML IJ SOLN
INTRAMUSCULAR | Status: AC
Start: 2018-03-28 — End: ?
  Filled 2018-03-28: qty 5

## 2018-03-28 MED ORDER — ONDANSETRON HCL 4 MG/2ML IJ SOLN
INTRAMUSCULAR | Status: DC | PRN
  Administered 2018-03-28: 4 mg via INTRAVENOUS

## 2018-03-28 MED ORDER — IOPAMIDOL (ISOVUE-300) INJECTION 61%
INTRAVENOUS | Status: DC | PRN
  Administered 2018-03-28: 50 mL

## 2018-03-28 MED ORDER — FENTANYL CITRATE (PF) 100 MCG/2ML IJ SOLN
INTRAMUSCULAR | Status: DC | PRN
  Administered 2018-03-28: 150 ug via INTRAVENOUS
  Administered 2018-03-28 (×2): 50 ug via INTRAVENOUS

## 2018-03-28 MED ORDER — SUGAMMADEX SODIUM 200 MG/2ML IV SOLN: INTRAVENOUS | Status: DC | PRN

## 2018-03-28 MED ORDER — SUGAMMADEX SODIUM 200 MG/2ML IV SOLN
INTRAVENOUS | Status: DC | PRN
  Administered 2018-03-28: 250 mg via INTRAVENOUS

## 2018-03-28 MED ORDER — ROCURONIUM BROMIDE 10 MG/ML (PF) SYRINGE
PREFILLED_SYRINGE | INTRAVENOUS | Status: DC | PRN
  Administered 2018-03-28: 10 mg via INTRAVENOUS
  Administered 2018-03-28: 50 mg via INTRAVENOUS
  Administered 2018-03-28 (×2): 10 mg via INTRAVENOUS

## 2018-03-28 MED ORDER — LIDOCAINE 2% (20 MG/ML) 5 ML SYRINGE
INTRAMUSCULAR | Status: DC | PRN
  Administered 2018-03-28: 100 mg via INTRAVENOUS

## 2018-03-28 MED ORDER — 0.9 % SODIUM CHLORIDE (POUR BTL) OPTIME
TOPICAL | Status: DC | PRN
  Administered 2018-03-28: 1000 mL

## 2018-03-28 MED ORDER — KETOROLAC TROMETHAMINE 30 MG/ML IJ SOLN
INTRAMUSCULAR | Status: DC | PRN
  Administered 2018-03-28: 15 mg via INTRAVENOUS

## 2018-03-28 SURGICAL SUPPLY — 48 items
APL SKNCLS STERI-STRIP NONHPOA (GAUZE/BANDAGES/DRESSINGS)
APPLIER CLIP 5 13 M/L LIGAMAX5 (MISCELLANEOUS) ×4
APR CLP MED LRG 5 ANG JAW (MISCELLANEOUS) ×1
BAG SPEC RTRVL 10 TROC 200 (ENDOMECHANICALS) ×1
BANDAGE ADH SHEER 1  50/CT (GAUZE/BANDAGES/DRESSINGS) ×12 IMPLANT
BENZOIN TINCTURE PRP APPL 2/3 (GAUZE/BANDAGES/DRESSINGS) IMPLANT
BLADE CLIPPER SURG (BLADE) IMPLANT
CANISTER SUCT 3000ML PPV (MISCELLANEOUS) ×4 IMPLANT
CATH REDDICK CHOLANGI 4FR 50CM (CATHETERS) ×4 IMPLANT
CHLORAPREP W/TINT 26ML (MISCELLANEOUS) ×4 IMPLANT
CLIP APPLIE 5 13 M/L LIGAMAX5 (MISCELLANEOUS) ×2 IMPLANT
CLOSURE WOUND 1/2 X4 (GAUZE/BANDAGES/DRESSINGS) ×1
COVER SURGICAL LIGHT HANDLE (MISCELLANEOUS) ×4 IMPLANT
ELECT REM PT RETURN 9FT ADLT (ELECTROSURGICAL) ×4
ELECTRODE REM PT RTRN 9FT ADLT (ELECTROSURGICAL) ×2 IMPLANT
GAUZE PACKING FOLDED 2  STR (GAUZE/BANDAGES/DRESSINGS) ×2
GAUZE PACKING FOLDED 2 STR (GAUZE/BANDAGES/DRESSINGS) ×2 IMPLANT
GAUZE SPONGE 2X2 8PLY STRL LF (GAUZE/BANDAGES/DRESSINGS) IMPLANT
GLOVE BIOGEL M STRL SZ7.5 (GLOVE) ×4 IMPLANT
GLOVE BIOGEL PI IND STRL 8 (GLOVE) ×4 IMPLANT
GLOVE BIOGEL PI INDICATOR 8 (GLOVE) ×4
GOWN STRL REUS W/ TWL LRG LVL3 (GOWN DISPOSABLE) ×4 IMPLANT
GOWN STRL REUS W/TWL 2XL LVL3 (GOWN DISPOSABLE) ×4 IMPLANT
GOWN STRL REUS W/TWL LRG LVL3 (GOWN DISPOSABLE) ×6
GRASPER SUT TROCAR 14GX15 (MISCELLANEOUS) IMPLANT
HEMOSTAT SNOW SURGICEL 2X4 (HEMOSTASIS) ×4 IMPLANT
KIT BASIN OR (CUSTOM PROCEDURE TRAY) ×4 IMPLANT
KIT TURNOVER KIT B (KITS) ×4 IMPLANT
NS IRRIG 1000ML POUR BTL (IV SOLUTION) ×4 IMPLANT
PAD ARMBOARD 7.5X6 YLW CONV (MISCELLANEOUS) ×4 IMPLANT
POUCH RETRIEVAL ECOSAC 10 (ENDOMECHANICALS) ×2 IMPLANT
POUCH RETRIEVAL ECOSAC 10MM (ENDOMECHANICALS) ×2
SCISSORS LAP 5X35 DISP (ENDOMECHANICALS) ×4 IMPLANT
SET CHOLANGIOGRAPH 5 50 .035 (SET/KITS/TRAYS/PACK) ×4 IMPLANT
SET IRRIG TUBING LAPAROSCOPIC (IRRIGATION / IRRIGATOR) ×4 IMPLANT
SLEEVE ENDOPATH XCEL 5M (ENDOMECHANICALS) ×8 IMPLANT
SPECIMEN JAR SMALL (MISCELLANEOUS) ×4 IMPLANT
SPONGE GAUZE 2X2 STER 10/PKG (GAUZE/BANDAGES/DRESSINGS)
STRIP CLOSURE SKIN 1/2X4 (GAUZE/BANDAGES/DRESSINGS) ×3 IMPLANT
SUT MNCRL AB 4-0 PS2 18 (SUTURE) ×4 IMPLANT
SUT VICRYL 0 UR6 27IN ABS (SUTURE) ×12 IMPLANT
TOWEL OR 17X24 6PK STRL BLUE (TOWEL DISPOSABLE) ×4 IMPLANT
TOWEL OR 17X26 10 PK STRL BLUE (TOWEL DISPOSABLE) ×4 IMPLANT
TRAY LAPAROSCOPIC MC (CUSTOM PROCEDURE TRAY) ×4 IMPLANT
TROCAR XCEL BLUNT TIP 100MML (ENDOMECHANICALS) ×4 IMPLANT
TROCAR XCEL NON-BLD 5MMX100MML (ENDOMECHANICALS) ×4 IMPLANT
TUBING INSUFFLATION (TUBING) ×4 IMPLANT
WATER STERILE IRR 1000ML POUR (IV SOLUTION) ×4 IMPLANT

## 2018-03-28 NOTE — Discharge Instructions (Signed)
[facility] °LAPAROSCOPIC SURGERY: POST OP INSTRUCTIONS °Always review your discharge instruction sheet given to you by the facility where your surgery was performed. °IF YOU HAVE DISABILITY OR FAMILY LEAVE FORMS, YOU MUST BRING THEM TO THE OFFICE FOR PROCESSING.   °DO NOT GIVE THEM TO YOUR DOCTOR. ° °PAIN CONTROL ° °1. First take acetaminophen (Tylenol) AND/or ibuprofen (Advil) to control your pain after surgery.  Follow directions on package.  Taking acetaminophen (Tylenol) and/or ibuprofen (Advil) regularly after surgery will help to control your pain and lower the amount of prescription pain medication you may need.  You should not take more than 4,000 mg (4 grams) of acetaminophen (Tylenol) in 24 hours.  You should not take ibuprofen (Advil), aleve, motrin, naprosyn or other NSAIDS if you have a history of stomach ulcers or chronic kidney disease.  °2. A prescription for pain medication may be given to you upon discharge.  Take your pain medication as prescribed, if you still have uncontrolled pain after taking acetaminophen (Tylenol) or ibuprofen (Advil). °3. Use ice packs to help control pain. °4. If you need a refill on your pain medication, please contact your pharmacy.  They will contact our office to request authorization. Prescriptions will not be filled after 5pm or on week-ends. ° °HOME MEDICATIONS °5. Take your usually prescribed medications unless otherwise directed. ° °DIET °6. You should follow a light diet the first few days after arrival home.  Be sure to include lots of fluids daily. Avoid fatty, fried foods.  ° °CONSTIPATION °7. It is common to experience some constipation after surgery and if you are taking pain medication.  Increasing fluid intake and taking a stool softener (such as Colace) will usually help or prevent this problem from occurring.  A mild laxative (Milk of Magnesia or Miralax) should be taken according to package instructions if there are no bowel  movements after 48 hours. ° °WOUND/INCISION CARE °8. Most patients will experience some swelling and bruising in the area of the incisions.  Ice packs will help.  Swelling and bruising can take several days to resolve.  °9. Unless discharge instructions indicate otherwise, follow guidelines below  °a. STERI-STRIPS - you may remove your outer bandages 48 hours after surgery, and you may shower at that time.  You have steri-strips (small skin tapes) in place directly over the incision.  These strips should be left on the skin for 7-10 days.   °b. DERMABOND/SKIN GLUE - you may shower in 24 hours.  The glue will flake off over the next 2-3 weeks. °10. Any sutures or staples will be removed at the office during your follow-up visit. ° °ACTIVITIES °11. You may resume regular (light) daily activities beginning the next day--such as daily self-care, walking, climbing stairs--gradually increasing activities as tolerated.  You may have sexual intercourse when it is comfortable.  Refrain from any heavy lifting or straining until approved by your doctor. °a. You may drive when you are no longer taking prescription pain medication, you can comfortably wear a seatbelt, and you can safely maneuver your car and apply brakes. ° °FOLLOW-UP °12. You should see your doctor in the office for a follow-up appointment approximately 2-3 weeks after your surgery.  You should have been given your post-op/follow-up appointment when your surgery was scheduled.  If you did not receive a post-op/follow-up appointment, make sure that you call for this appointment within a day or two after you arrive home to insure a convenient appointment time. ° °OTHER   INSTRUCTIONS °13.  ° °WHEN TO CALL YOUR DOCTOR: °1. Fever over 101.0 °2. Inability to urinate °3. Continued bleeding from incision. °4. Increased pain, redness, or drainage from the incision. °5. Increasing abdominal pain ° °The clinic staff is available to answer your questions during regular  business hours.  Please don’t hesitate to call and ask to speak to one of the nurses for clinical concerns.  If you have a medical emergency, go to the nearest emergency room or call 911.  A surgeon from [facility] is always on call at the hospital. °[address], [address], [place], [place]  [address] ? [address] [address], [place], [place]   [address] °[phone] ? [phone] ? [phone] [phone] °Web site: [email or web address] ° °

## 2018-03-28 NOTE — Anesthesia Procedure Notes (Signed)
Procedure Name: Intubation Date/Time: 03/28/2018 9:27 AM Performed by: Griffin Dakin, RN Pre-anesthesia Checklist: Patient identified, Emergency Drugs available, Suction available and Patient being monitored Patient Re-evaluated:Patient Re-evaluated prior to induction Oxygen Delivery Method: Circle system utilized Preoxygenation: Pre-oxygenation with 100% oxygen Induction Type: IV induction Ventilation: Mask ventilation without difficulty and Mask ventilation throughout procedure Laryngoscope Size: Mac and 3 Grade View: Grade I Tube type: Oral Tube size: 7.5 mm Number of attempts: 1 Airway Equipment and Method: Stylet Placement Confirmation: ETT inserted through vocal cords under direct vision,  positive ETCO2 and breath sounds checked- equal and bilateral Secured at: 24 cm Tube secured with: Tape Dental Injury: Teeth and Oropharynx as per pre-operative assessment

## 2018-03-28 NOTE — Anesthesia Preprocedure Evaluation (Signed)
Anesthesia Evaluation  Patient identified by MRN, date of birth, ID band Patient awake    Reviewed: Allergy & Precautions, NPO status , Patient's Chart, lab work & pertinent test results  Airway Mallampati: I  TM Distance: >3 FB Neck ROM: Full    Dental   Pulmonary sleep apnea ,    Pulmonary exam normal        Cardiovascular hypertension, Pt. on medications Normal cardiovascular exam     Neuro/Psych Anxiety    GI/Hepatic GERD  Medicated and Controlled,  Endo/Other  diabetes, Type 2, Oral Hypoglycemic Agents  Renal/GU      Musculoskeletal   Abdominal   Peds  Hematology   Anesthesia Other Findings   Reproductive/Obstetrics                             Anesthesia Physical Anesthesia Plan  ASA: III  Anesthesia Plan: General   Post-op Pain Management:    Induction: Intravenous  PONV Risk Score and Plan: 2 and Ondansetron and Midazolam  Airway Management Planned: Oral ETT  Additional Equipment:   Intra-op Plan:   Post-operative Plan: Extubation in OR  Informed Consent: I have reviewed the patients History and Physical, chart, labs and discussed the procedure including the risks, benefits and alternatives for the proposed anesthesia with the patient or authorized representative who has indicated his/her understanding and acceptance.     Plan Discussed with: CRNA and Surgeon  Anesthesia Plan Comments:         Anesthesia Quick Evaluation

## 2018-03-28 NOTE — Op Note (Signed)
Justin Herrera [id number] [birth year] 03/28/2018  Laparoscopic Cholecystectomy with IOC Procedure Note  Indications: This patient presents with symptomatic gallbladder disease and will undergo laparoscopic cholecystectomy.  Pre-operative Diagnosis: symptomatic cholelithiasis  Post-operative Diagnosis: chronic calculous cholecystitis  Surgeon: [name] MD FACS  Assistants: Judyann Munson RNFA  Anesthesia: General endotracheal anesthesia  Procedure Details  The patient was seen again in the Holding Room. The risks, benefits, complications, treatment options, and expected outcomes were discussed with the patient. The possibilities of reaction to medication, pulmonary aspiration, perforation of viscus, bleeding, recurrent infection, finding a normal gallbladder, the need for additional procedures, failure to diagnose a condition, the possible need to convert to an open procedure, and creating a complication requiring transfusion or operation were discussed with the patient. The likelihood of improving the patient's symptoms with return to their baseline status is good.  The patient and/or family concurred with the proposed plan, giving informed consent. The site of surgery properly noted. The patient was taken to Operating Room, identified as Justin Herrera and the procedure verified as Laparoscopic Cholecystectomy with Intraoperative Cholangiogram. A Time Out was held and the above information confirmed. Antibiotic prophylaxis was administered.   Prior to the induction of general anesthesia, antibiotic prophylaxis was administered. General endotracheal anesthesia was then administered and tolerated well. After the induction, the abdomen was prepped with Chloraprep and draped in the sterile fashion. The patient was positioned in the supine position.  Local anesthetic agent was injected into the skin near the umbilicus and an incision made. We dissected down to the abdominal fascia with  blunt dissection.  The fascia was incised vertically and we entered the peritoneal cavity bluntly.  A pursestring suture of 0-Vicryl was placed around the fascial opening.  The Hasson cannula was inserted and secured with the stay suture.  Pneumoperitoneum was then created with CO2 and tolerated well without any adverse changes in the patient's vital signs. An 5-mm port was placed in the subxiphoid position.  Two 5-mm ports were placed in the right upper quadrant. All skin incisions were infiltrated with a local anesthetic agent before making the incision and placing the trocars.   We positioned the patient in reverse Trendelenburg, tilted slightly to the patient's left.  The gallbladder was identified.  He had a fair amount of omental adhesions around the gallbladder.  The fundus was grasped and the gallbladder was partially lifted then using hook electrocautery I took down the adhesions to the fundus and body the gallbladder.  I was then able to retracted cephalic. Adhesions were lysed bluntly and with the electrocautery where indicated, taking care not to injure any adjacent organs or viscus.  He had a very long gallbladder.  The infundibulum was grasped and retracted laterally, exposing the peritoneum overlying the triangle of Calot.  using hook electrocautery, I incised the peritoneal around the infundibulum.  The cystic artery was identified.  It was circumferentially dissected out and around.  It was anterior to what appeared to be the cystic duct as well as the infundibulum.  This structure clearly went into the gallbladder.  I ended up going ahead and ligating the cystic artery in order to further expose the infundibulum and cystic duct.  The cystic artery was clipped with 2 clips proximally and one distally and then transected with EndoShears.  The infundibulum appeared to go down a little bit further.  I decided to perform the cholangiogram through the gallbladder at this point.  A 14-gauge Angiocath  needle was placed through the  abdominal wall.  I then inserted a reddick cholangiogram catheter into the fundus of the gallbladder and the balloon was inflated.  There was no leakage of saline.  We then performed a cholangiogram under fluoroscopy.  This required several runs since we were distending the gallbladder.  The cystic duct common bile duct and duodenum were visualized.  There is no evidence of a filling defect.  This confirmed what I had seen laparoscopically that the patient had a very large long gallbladder.  We then returned laparoscopically.  The catheter was removed.  I continued mobilizing the infundibulum.  I was able to achieve a critical view around the cystic duct.  It was circumferentially dissected out and around.  I took down the lateral attachments of the body of the gallbladder.  There were no other structures entering the gallbladder.  3 clips were placed on the downside of the cystic duct and one as it entered the gallbladder.  It was then transected with EndoShears.  The gallbladder was dissected from the liver bed in retrograde fashion with the electrocautery.  The fundus of the gallbladder was slightly intrahepatic.  The gallbladder was removed and placed in an Ecco sac.  The gallbladder and Ecco sac were then removed through the umbilical port site. The liver bed was irrigated and inspected. Hemostasis was achieved with the electrocautery. Copious irrigation was utilized and was repeatedly aspirated until clear.  I ended up placing a piece of surgical snow in the gallbladder fossa.  The pursestring suture was used to close the umbilical fascia however the suture broke.  I ended up placing 2 interrupted 0 Vicryl sutures through the umbilical fascia using the PMI suture passer with laparoscopic guidance.  We again inspected the right upper quadrant for hemostasis.  The umbilical closure was inspected and there was no air leak and nothing trapped within the closure. Pneumoperitoneum  was released as we removed the trocars.  4-0 Monocryl was used to close the skin.   Benzoin, steri-strips, and clean dressings were applied. The patient was then extubated and brought to the recovery room in stable condition. Instrument, sponge, and needle counts were correct at closure and at the conclusion of the case.   Findings: Chronic Cholecystitis with Cholelithiasis  Estimated Blood Loss: Minimal         Drains: none         Specimens: Gallbladder           Complications: None; patient tolerated the procedure well.         Disposition: PACU - hemodynamically stable.         Condition: stable  Leighton Ruff. Redmond Pulling, MD, FACS General, Bariatric, & Minimally Invasive Surgery [facility], [place]

## 2018-03-28 NOTE — Interval H&P Note (Signed)
History and Physical Interval Note:  03/28/2018 8:54 AM  Justin Herrera  has presented today for surgery, with the diagnosis of SYMPTOMATIC CHOLELITHIASIS  The various methods of treatment have been discussed with the patient and family. After consideration of risks, benefits and other options for treatment, the patient has consented to  Procedure(s): LAPAROSCOPIC CHOLECYSTECTOMY (N/A) as a surgical intervention .  The patient's history has been reviewed, patient examined, no change in status, stable for surgery.  I have reviewed the patient's chart and labs.  Questions were answered to the patient's satisfaction.    Leighton Ruff. Redmond Pulling, MD, FACS General, Bariatric, & Minimally Invasive Surgery [facility], [place]  [name]

## 2018-03-28 NOTE — Transfer of Care (Signed)
Immediate Anesthesia Transfer of Care Note  Patient: Justin Herrera  Procedure(s) Performed: LAPAROSCOPIC CHOLECYSTECTOMY WITH INTRAOPERATIVE CHOLANGIOGRAM (N/A Abdomen)  Patient Location: PACU  Anesthesia Type:General  Level of Consciousness: awake, alert , oriented and patient cooperative  Airway & Oxygen Therapy: Patient Spontanous Breathing and Patient connected to face mask oxygen  Post-op Assessment: Report given to RN and Post -op Vital signs reviewed and stable  Post vital signs: Reviewed and stable  Last Vitals:  Vitals Value Taken Time  BP    Temp    Pulse 79 03/28/2018 11:30 AM  Resp 26 03/28/2018 11:30 AM  SpO2 97 % 03/28/2018 11:30 AM  Vitals shown include unvalidated device data.  Last Pain:  Vitals:   03/28/18 0800  TempSrc: Oral  PainSc: 0-No pain      Patients Stated Pain Goal: 3 (83/50/75 7322)  Complications: No apparent anesthesia complications

## 2018-03-29 NOTE — Anesthesia Postprocedure Evaluation (Signed)
Anesthesia Post Note  Patient: Justin Herrera  Procedure(s) Performed: LAPAROSCOPIC CHOLECYSTECTOMY WITH INTRAOPERATIVE CHOLANGIOGRAM (N/A Abdomen)     Patient location during evaluation: PACU Anesthesia Type: General Level of consciousness: awake and alert Pain management: pain level controlled Vital Signs Assessment: post-procedure vital signs reviewed and stable Respiratory status: spontaneous breathing, nonlabored ventilation, respiratory function stable and patient connected to nasal cannula oxygen Cardiovascular status: blood pressure returned to baseline and stable Postop Assessment: no apparent nausea or vomiting Anesthetic complications: no    Last Vitals:  Vitals:   03/28/18 1215 03/28/18 1230  BP: (!) 145/75 138/83  Pulse: (!) 54 64  Resp: 16 18  Temp:  (!) 36.3 C  SpO2: 100% 100%    Last Pain:  Vitals:   03/28/18 1230  TempSrc:   PainSc: 3                  [name] DAVID

## 2018-03-30 ENCOUNTER — Encounter (HOSPITAL_COMMUNITY): Payer: Self-pay | Admitting: General Surgery

## 2018-05-30 ENCOUNTER — Ambulatory Visit (INDEPENDENT_AMBULATORY_CARE_PROVIDER_SITE_OTHER): Payer: Medicare Other

## 2018-05-30 DIAGNOSIS — Z23 Encounter for immunization: Secondary | ICD-10-CM

## 2018-05-30 NOTE — Progress Notes (Signed)
Per orders of Dr. Sarajane Jews,  injection of Fluzone given by Franco Collet. Patient tolerated injection well.

## 2018-08-08 ENCOUNTER — Other Ambulatory Visit: Payer: Self-pay | Admitting: Family Medicine

## 2018-08-14 LAB — HM DIABETES EYE EXAM

## 2018-08-15 ENCOUNTER — Encounter: Payer: Self-pay | Admitting: Family Medicine

## 2018-08-15 ENCOUNTER — Ambulatory Visit (INDEPENDENT_AMBULATORY_CARE_PROVIDER_SITE_OTHER): Payer: Medicare Other | Admitting: Family Medicine

## 2018-08-15 VITALS — BP 118/76 | HR 63 | Temp 98.5°F | Ht 70.5 in | Wt 231.1 lb

## 2018-08-15 DIAGNOSIS — N401 Enlarged prostate with lower urinary tract symptoms: Secondary | ICD-10-CM | POA: Diagnosis not present

## 2018-08-15 DIAGNOSIS — N138 Other obstructive and reflux uropathy: Secondary | ICD-10-CM

## 2018-08-15 DIAGNOSIS — G4733 Obstructive sleep apnea (adult) (pediatric): Secondary | ICD-10-CM | POA: Diagnosis not present

## 2018-08-15 DIAGNOSIS — Z23 Encounter for immunization: Secondary | ICD-10-CM | POA: Diagnosis not present

## 2018-08-15 DIAGNOSIS — I10 Essential (primary) hypertension: Secondary | ICD-10-CM | POA: Diagnosis not present

## 2018-08-15 DIAGNOSIS — K219 Gastro-esophageal reflux disease without esophagitis: Secondary | ICD-10-CM

## 2018-08-15 DIAGNOSIS — E119 Type 2 diabetes mellitus without complications: Secondary | ICD-10-CM

## 2018-08-15 DIAGNOSIS — F5101 Primary insomnia: Secondary | ICD-10-CM | POA: Diagnosis not present

## 2018-08-15 LAB — CBC WITH DIFFERENTIAL/PLATELET
Basophils Absolute: 0.1 10*3/uL (ref 0.0–0.1)
Basophils Relative: 1 % (ref 0.0–3.0)
Eosinophils Absolute: 0.2 10*3/uL (ref 0.0–0.7)
Eosinophils Relative: 2.7 % (ref 0.0–5.0)
HEMATOCRIT: 45.8 % (ref 39.0–52.0)
HEMOGLOBIN: 16 g/dL (ref 13.0–17.0)
LYMPHS PCT: 24.9 % (ref 12.0–46.0)
Lymphs Abs: 1.6 10*3/uL (ref 0.7–4.0)
MCHC: 34.8 g/dL (ref 30.0–36.0)
MCV: 86.4 fl (ref 78.0–100.0)
MONOS PCT: 10.9 % (ref 3.0–12.0)
Monocytes Absolute: 0.7 10*3/uL (ref 0.1–1.0)
Neutro Abs: 3.8 10*3/uL (ref 1.4–7.7)
Neutrophils Relative %: 60.5 % (ref 43.0–77.0)
Platelets: 232 10*3/uL (ref 150.0–400.0)
RBC: 5.3 Mil/uL (ref 4.22–5.81)
RDW: 13.3 % (ref 11.5–15.5)
WBC: 6.2 10*3/uL (ref 4.0–10.5)

## 2018-08-15 LAB — BASIC METABOLIC PANEL
BUN: 18 mg/dL (ref 6–23)
CHLORIDE: 101 meq/L (ref 96–112)
CO2: 32 mEq/L (ref 19–32)
Calcium: 9.9 mg/dL (ref 8.4–10.5)
Creatinine, Ser: 1.12 mg/dL (ref 0.40–1.50)
GFR: 69.58 mL/min (ref >60.00)
Glucose, Bld: 131 mg/dL — ABNORMAL HIGH (ref 70–99)
Potassium: 4.5 mEq/L (ref 3.5–5.1)
SODIUM: 139 meq/L (ref 135–145)

## 2018-08-15 LAB — LIPID PANEL
CHOL/HDL RATIO: 5
Cholesterol: 160 mg/dL (ref 0–200)
HDL: 32.4 mg/dL — ABNORMAL LOW (ref >39.00)
LDL CALC: 100 mg/dL — AB (ref 0–99)
NONHDL: 128.08
TRIGLYCERIDES: 139 mg/dL (ref 0.0–149.0)
VLDL: 27.8 mg/dL (ref 0.0–40.0)

## 2018-08-15 LAB — HEPATIC FUNCTION PANEL
ALBUMIN: 4.6 g/dL (ref 3.5–5.2)
ALT: 30 U/L (ref 0–53)
AST: 20 U/L (ref 0–37)
Alkaline Phosphatase: 52 U/L (ref 39–117)
BILIRUBIN TOTAL: 0.8 mg/dL (ref 0.2–1.2)
Bilirubin, Direct: 0.2 mg/dL (ref 0.0–0.3)
Total Protein: 7 g/dL (ref 6.0–8.3)

## 2018-08-15 LAB — POC URINALSYSI DIPSTICK (AUTOMATED)
Bilirubin, UA: NEGATIVE
Glucose, UA: NEGATIVE
Ketones, UA: NEGATIVE
LEUKOCYTES UA: NEGATIVE
NITRITE UA: NEGATIVE
PH UA: 5.5 (ref 5.0–8.0)
Protein, UA: POSITIVE — AB
RBC UA: NEGATIVE
Spec Grav, UA: 1.025 (ref 1.010–1.025)
UROBILINOGEN UA: 0.2 U/dL

## 2018-08-15 LAB — TSH: TSH: 1.21 u[IU]/mL (ref 0.35–4.50)

## 2018-08-15 LAB — PSA: PSA: 0.67 ng/mL (ref 0.10–4.00)

## 2018-08-15 LAB — HEMOGLOBIN A1C: Hgb A1c MFr Bld: 6.6 % — ABNORMAL HIGH (ref 4.6–6.5)

## 2018-08-15 MED ORDER — TEMAZEPAM 30 MG PO CAPS: 30.0000 mg | ORAL_CAPSULE | Freq: Every evening | ORAL | 5 refills | Status: DC | PRN

## 2018-08-15 MED ORDER — ESOMEPRAZOLE MAGNESIUM 40 MG PO CPDR
40.0000 mg | DELAYED_RELEASE_CAPSULE | Freq: Every day | ORAL | 3 refills | Status: DC
Start: 2018-08-15 — End: 2019-08-03

## 2018-08-15 MED ORDER — METFORMIN HCL 500 MG PO TABS: 500.0000 mg | ORAL_TABLET | Freq: Two times a day (BID) | ORAL | 3 refills | Status: DC

## 2018-08-15 MED ORDER — GLIPIZIDE 10 MG PO TABS: 10.0000 mg | ORAL_TABLET | Freq: Two times a day (BID) | ORAL | 3 refills | Status: DC

## 2018-08-15 MED ORDER — DICLOFENAC SODIUM 75 MG PO TBEC: 75.0000 mg | DELAYED_RELEASE_TABLET | Freq: Two times a day (BID) | ORAL | 3 refills | Status: DC

## 2018-08-15 NOTE — Progress Notes (Signed)
Subjective:    Patient ID: Justin Herrera, male    DOB: [birth year], 67 y.o.   MRN: [id number]  HPI Here to follow up on issues. He feels great but he does complain of trouble sleeping. He is pleased with Zoloft since it helps his moods, but he often lies in bed and thinks about things at night. His diabetes has been stable. His BP is stable.    Review of Systems  Constitutional: Negative.   HENT: Negative.   Eyes: Negative.   Respiratory: Negative.   Cardiovascular: Negative.   Gastrointestinal: Negative.   Genitourinary: Negative.   Musculoskeletal: Negative.   Skin: Negative.   Neurological: Negative.   Psychiatric/Behavioral: Positive for sleep disturbance.       Objective:   Physical Exam Constitutional:      General: He is not in acute distress.    Appearance: He is well-developed. He is not diaphoretic.  HENT:     Head: Normocephalic and atraumatic.     Right Ear: External ear normal.     Left Ear: External ear normal.     Nose: Nose normal.     Mouth/Throat:     Pharynx: No oropharyngeal exudate.  Eyes:     General: No scleral icterus.       Right eye: No discharge.        Left eye: No discharge.     Conjunctiva/sclera: Conjunctivae normal.     Pupils: Pupils are equal, round, and reactive to light.  Neck:     Musculoskeletal: Neck supple.     Thyroid: No thyromegaly.     Vascular: No JVD.     Trachea: No tracheal deviation.  Cardiovascular:     Rate and Rhythm: Normal rate and regular rhythm.     Heart sounds: Normal heart sounds. No murmur. No friction rub. No gallop.   Pulmonary:     Effort: Pulmonary effort is normal. No respiratory distress.     Breath sounds: Normal breath sounds. No wheezing or rales.  Chest:     Chest wall: No tenderness.  Abdominal:     General: Bowel sounds are normal. There is no distension.     Palpations: Abdomen is soft. There is no mass.     Tenderness: There is no abdominal tenderness. There is no guarding or  rebound.  Genitourinary:    Penis: Normal. No tenderness.      Prostate: Normal.     Rectum: Normal. Guaiac result negative.  Musculoskeletal: Normal range of motion.        General: No tenderness.  Lymphadenopathy:     Cervical: No cervical adenopathy.  Skin:    General: Skin is warm and dry.     Coloration: Skin is not pale.     Findings: No erythema or rash.  Neurological:     Mental Status: He is alert and oriented to person, place, and time.     Cranial Nerves: No cranial nerve deficit.     Motor: No abnormal muscle tone.     Coordination: Coordination normal.     Deep Tendon Reflexes: Reflexes are normal and symmetric. Reflexes normal.  Psychiatric:        Behavior: Behavior normal.        Thought Content: Thought content normal.        Judgment: Judgment normal.           Assessment & Plan:  His HTN is stable. We will get fasting labs to check the A1c and  lipids, etc. For insomnia he will try Temazepam.  Alysia Penna, MD

## 2018-08-18 ENCOUNTER — Encounter: Payer: Self-pay | Admitting: *Deleted

## 2018-09-04 ENCOUNTER — Telehealth: Payer: Self-pay

## 2018-09-04 NOTE — Telephone Encounter (Signed)
Cover My Meds is stating "patient not found." Have received PA forms to be faxed.  Forms have been completed and faxed back.

## 2018-09-04 NOTE — Telephone Encounter (Signed)
Received a fax from [facility] for PA on Temazepam. PA has been sent to Cover My Meds.  [id number]

## 2018-09-16 NOTE — Telephone Encounter (Signed)
Spoke with patients insurance. Temazepam is on the patients preferred medication list. No PA was needed.

## 2018-11-14 ENCOUNTER — Encounter: Payer: Self-pay | Admitting: Gastroenterology

## 2019-02-27 ENCOUNTER — Other Ambulatory Visit: Payer: Self-pay | Admitting: Family Medicine

## 2019-03-03 NOTE — Telephone Encounter (Signed)
Dr. Fry please advise on refill. Thanks  

## 2019-03-03 NOTE — Telephone Encounter (Signed)
Call in #30 with 5 rf 

## 2019-03-15 ENCOUNTER — Other Ambulatory Visit: Payer: Self-pay | Admitting: Family Medicine

## 2019-05-03 ENCOUNTER — Other Ambulatory Visit: Payer: Self-pay | Admitting: Family Medicine

## 2019-08-01 ENCOUNTER — Other Ambulatory Visit: Payer: Self-pay | Admitting: Family Medicine

## 2019-08-14 ENCOUNTER — Other Ambulatory Visit: Payer: Self-pay

## 2019-08-17 ENCOUNTER — Encounter: Payer: Self-pay | Admitting: Family Medicine

## 2019-08-17 ENCOUNTER — Other Ambulatory Visit: Payer: Self-pay | Admitting: *Deleted

## 2019-08-17 ENCOUNTER — Ambulatory Visit (INDEPENDENT_AMBULATORY_CARE_PROVIDER_SITE_OTHER): Payer: Medicare Other | Admitting: Family Medicine

## 2019-08-17 ENCOUNTER — Other Ambulatory Visit: Payer: Self-pay

## 2019-08-17 ENCOUNTER — Other Ambulatory Visit (INDEPENDENT_AMBULATORY_CARE_PROVIDER_SITE_OTHER): Payer: Medicare Other

## 2019-08-17 VITALS — BP 142/84 | HR 66 | Temp 97.4°F | Wt 233.4 lb

## 2019-08-17 DIAGNOSIS — N401 Enlarged prostate with lower urinary tract symptoms: Secondary | ICD-10-CM

## 2019-08-17 DIAGNOSIS — E119 Type 2 diabetes mellitus without complications: Secondary | ICD-10-CM | POA: Diagnosis not present

## 2019-08-17 DIAGNOSIS — F411 Generalized anxiety disorder: Secondary | ICD-10-CM

## 2019-08-17 DIAGNOSIS — M129 Arthropathy, unspecified: Secondary | ICD-10-CM

## 2019-08-17 DIAGNOSIS — R6882 Decreased libido: Secondary | ICD-10-CM

## 2019-08-17 DIAGNOSIS — N138 Other obstructive and reflux uropathy: Secondary | ICD-10-CM

## 2019-08-17 DIAGNOSIS — F5101 Primary insomnia: Secondary | ICD-10-CM | POA: Diagnosis not present

## 2019-08-17 DIAGNOSIS — I10 Essential (primary) hypertension: Secondary | ICD-10-CM

## 2019-08-17 DIAGNOSIS — K219 Gastro-esophageal reflux disease without esophagitis: Secondary | ICD-10-CM

## 2019-08-17 MED ORDER — METOPROLOL SUCCINATE ER 50 MG PO TB24: ORAL_TABLET | ORAL | 3 refills | Status: DC

## 2019-08-17 MED ORDER — TEMAZEPAM 30 MG PO CAPS: ORAL_CAPSULE | ORAL | 5 refills | Status: DC

## 2019-08-17 MED ORDER — AMLODIPINE-OLMESARTAN 10-40 MG PO TABS: 1.0000 | ORAL_TABLET | Freq: Every day | ORAL | 3 refills | Status: DC

## 2019-08-17 NOTE — Progress Notes (Signed)
Subjective:    Patient ID: Justin Herrera, male    DOB: 08/01/1951, 68 y.o.   MRN: [id number]  HPI Here to follow up on issues. He feels well except for a few issues . Over the past year he has been very fatigued at times and he feels less motivated to take on projects around his house that he would normally not hesitate to take on. He has also had a drop in libido and had trouble with erections. He asks to check his testosterone level, which apparently has never been looked at. He knows his BP has been borderline high because he has been under a lot of stress this year. Both of his parents died this past year and he is executor of the estate. He says there has been a lot of arguing among his siblings about who gets what.     Review of Systems  Constitutional: Positive for fatigue.  HENT: Negative.   Eyes: Negative.   Respiratory: Negative.   Cardiovascular: Negative.   Gastrointestinal: Negative.   Genitourinary: Negative.   Musculoskeletal: Negative.   Skin: Negative.   Neurological: Negative.   Psychiatric/Behavioral: Negative.        Objective:   Physical Exam Constitutional:      General: He is not in acute distress.    Appearance: He is well-developed. He is obese. He is not diaphoretic.  HENT:     Head: Normocephalic and atraumatic.     Right Ear: External ear normal.     Left Ear: External ear normal.     Nose: Nose normal.     Mouth/Throat:     Pharynx: No oropharyngeal exudate.  Eyes:     General: No scleral icterus.       Right eye: No discharge.        Left eye: No discharge.     Conjunctiva/sclera: Conjunctivae normal.     Pupils: Pupils are equal, round, and reactive to light.  Neck:     Thyroid: No thyromegaly.     Vascular: No JVD.     Trachea: No tracheal deviation.  Cardiovascular:     Rate and Rhythm: Normal rate and regular rhythm.     Heart sounds: Normal heart sounds. No murmur. No friction rub. No gallop.   Pulmonary:     Effort:  Pulmonary effort is normal. No respiratory distress.     Breath sounds: Normal breath sounds. No wheezing or rales.  Chest:     Chest wall: No tenderness.  Abdominal:     General: Bowel sounds are normal. There is no distension.     Palpations: Abdomen is soft. There is no mass.     Tenderness: There is no abdominal tenderness. There is no guarding or rebound.  Genitourinary:    Penis: Normal. No tenderness.      Testes: Normal.     Prostate: Normal.     Rectum: Normal. Guaiac result negative.  Musculoskeletal:        General: No tenderness. Normal range of motion.     Cervical back: Neck supple.  Lymphadenopathy:     Cervical: No cervical adenopathy.  Skin:    General: Skin is warm and dry.     Coloration: Skin is not pale.     Findings: No erythema or rash.  Neurological:     Mental Status: He is alert and oriented to person, place, and time.     Cranial Nerves: No cranial nerve deficit.     Motor:  No abnormal muscle tone.     Coordination: Coordination normal.     Deep Tendon Reflexes: Reflexes are normal and symmetric. Reflexes normal.  Psychiatric:        Behavior: Behavior normal.        Thought Content: Thought content normal.        Judgment: Judgment normal.           Assessment & Plan:  His anxiety has been a bit out of control and he will stay on Zoloft. He sleeps well with Temazepam. His HTN has been under borderline control but we agreed to stay on his current regimen. His diabetes seems to be stable but we will get fasting labs today including an A1c. We will also check a testosterone level.  Alysia Penna, MD

## 2019-08-18 ENCOUNTER — Encounter: Payer: Self-pay | Admitting: Family Medicine

## 2019-08-18 LAB — BASIC METABOLIC PANEL
BUN: 17 mg/dL (ref 6–23)
CO2: 30 mEq/L (ref 19–32)
Calcium: 10 mg/dL (ref 8.4–10.5)
Chloride: 103 mEq/L (ref 96–112)
Creatinine, Ser: 1.1 mg/dL (ref 0.40–1.50)
GFR: 66.64 mL/min (ref >60.00)
Glucose, Bld: 130 mg/dL — ABNORMAL HIGH (ref 70–99)
Potassium: 4.5 mEq/L (ref 3.5–5.1)
Sodium: 139 mEq/L (ref 135–145)

## 2019-08-18 LAB — HEPATIC FUNCTION PANEL
ALT: 27 U/L (ref 0–53)
AST: 19 U/L (ref 0–37)
Albumin: 4.5 g/dL (ref 3.5–5.2)
Alkaline Phosphatase: 49 U/L (ref 39–117)
Bilirubin, Direct: 0.1 mg/dL (ref 0.0–0.3)
Total Bilirubin: 0.6 mg/dL (ref 0.2–1.2)
Total Protein: 6.8 g/dL (ref 6.0–8.3)

## 2019-08-18 LAB — TESTOSTERONE: Testosterone: 206.34 ng/dL — ABNORMAL LOW (ref 300.00–890.00)

## 2019-08-18 LAB — TSH: TSH: 1.3 u[IU]/mL (ref 0.35–4.50)

## 2019-08-18 LAB — LIPID PANEL
Cholesterol: 160 mg/dL (ref 0–200)
HDL: 32.7 mg/dL — ABNORMAL LOW (ref >39.00)
LDL Cholesterol: 96 mg/dL (ref 0–99)
NonHDL: 127.57
Total CHOL/HDL Ratio: 5
Triglycerides: 156 mg/dL — ABNORMAL HIGH (ref 0.0–149.0)
VLDL: 31.2 mg/dL (ref 0.0–40.0)

## 2019-08-18 LAB — PSA: PSA: 0.73 ng/mL (ref 0.10–4.00)

## 2019-08-19 NOTE — Telephone Encounter (Signed)
The A1c and the CBC were not done. Please call the lab and find out why

## 2019-08-19 NOTE — Telephone Encounter (Signed)
Noted. Please have him schedule another lab appt to have these drawn

## 2019-08-20 LAB — GLYCOHEMOGLOBIN, TOTAL: Glycohemoglobin (GHb),Total: 9 % — ABNORMAL HIGH (ref 5.4–7.4)

## 2019-09-04 DIAGNOSIS — H903 Sensorineural hearing loss, bilateral: Secondary | ICD-10-CM | POA: Diagnosis not present

## 2019-09-04 DIAGNOSIS — H9313 Tinnitus, bilateral: Secondary | ICD-10-CM | POA: Diagnosis not present

## 2019-10-04 ENCOUNTER — Ambulatory Visit: Payer: Medicare PPO | Attending: Internal Medicine

## 2019-10-04 DIAGNOSIS — Z23 Encounter for immunization: Secondary | ICD-10-CM

## 2019-10-04 NOTE — Progress Notes (Signed)
   Covid-19 Vaccination Clinic  Name:  JAHBARI YOUKER    MRN: [id number] DOB: [birth year]  10/04/2019  Mr. Ditter was observed post Covid-19 immunization for 15 minutes without incidence. He was provided with Vaccine Information Sheet and instruction to access the V-Safe system.   Mr. Esper was instructed to call 911 with any severe reactions post vaccine: Marland Kitchen Difficulty breathing  . Swelling of your face and throat  . A fast heartbeat  . A bad rash all over your body  . Dizziness and weakness    Immunizations Administered    Name Date Dose VIS Date Route   Pfizer COVID-19 Vaccine 10/04/2019  3:09 PM 0.3 mL 08/07/2019 Intramuscular   Manufacturer: [place]   Lot: [id number]   [place]: [id number]

## 2019-10-19 ENCOUNTER — Ambulatory Visit: Payer: Medicare Other

## 2019-10-28 ENCOUNTER — Ambulatory Visit: Payer: Medicare PPO

## 2019-10-28 ENCOUNTER — Ambulatory Visit: Payer: Medicare PPO | Attending: Internal Medicine

## 2019-10-28 DIAGNOSIS — Z23 Encounter for immunization: Secondary | ICD-10-CM

## 2019-10-28 NOTE — Progress Notes (Signed)
   Covid-19 Vaccination Clinic  Name:  Justin Herrera    MRN: [id number] DOB: 1951/03/09  10/28/2019  [name] was observed post Covid-19 immunization for 15 minutes without incident. He was provided with Vaccine Information Sheet and instruction to access the V-Safe system.   [name] was instructed to call 911 with any severe reactions post vaccine: Marland Kitchen Difficulty breathing  . Swelling of face and throat  . A fast heartbeat  . A bad rash all over body  . Dizziness and weakness   Immunizations Administered    Name Date Dose VIS Date Route   Pfizer COVID-19 Vaccine 10/28/2019  8:31 AM 0.3 mL 08/07/2019 Intramuscular   Manufacturer: [place]   Lot: [id number]   [place]: [id number]

## 2020-01-12 ENCOUNTER — Telehealth: Payer: Self-pay | Admitting: Cardiology

## 2020-01-12 NOTE — Telephone Encounter (Signed)
Contact patient 01/12/20 to schedule appointment, left voicemail

## 2020-02-17 ENCOUNTER — Other Ambulatory Visit: Payer: Self-pay | Admitting: Family Medicine

## 2020-02-18 NOTE — Telephone Encounter (Signed)
Last filled 08/17/2019 Last OV 08/17/2019  Ok to fill?

## 2020-02-23 ENCOUNTER — Telehealth: Payer: Self-pay

## 2020-02-23 NOTE — Telephone Encounter (Signed)
Called pt again no answer phone was not ringing.

## 2020-02-23 NOTE — Telephone Encounter (Signed)
Patient returned call to office, patient scheduled for 02/24/20.

## 2020-02-23 NOTE — Telephone Encounter (Signed)
Received a Rx request. However, pt is due for office visit for further refills. Called pt but no answer. Lm to return call.

## 2020-02-24 ENCOUNTER — Other Ambulatory Visit: Payer: Self-pay

## 2020-02-24 ENCOUNTER — Ambulatory Visit: Payer: Medicare PPO | Admitting: Family Medicine

## 2020-02-24 ENCOUNTER — Encounter: Payer: Self-pay | Admitting: Family Medicine

## 2020-02-24 VITALS — BP 140/70 | HR 70 | Temp 97.2°F | Wt 236.2 lb

## 2020-02-24 DIAGNOSIS — E119 Type 2 diabetes mellitus without complications: Secondary | ICD-10-CM

## 2020-02-24 DIAGNOSIS — F5101 Primary insomnia: Secondary | ICD-10-CM | POA: Diagnosis not present

## 2020-02-24 DIAGNOSIS — I10 Essential (primary) hypertension: Secondary | ICD-10-CM | POA: Diagnosis not present

## 2020-02-24 DIAGNOSIS — F411 Generalized anxiety disorder: Secondary | ICD-10-CM | POA: Diagnosis not present

## 2020-02-24 LAB — HEMOGLOBIN A1C: Hgb A1c MFr Bld: 6.6 % — ABNORMAL HIGH (ref 4.6–6.5)

## 2020-02-24 NOTE — Progress Notes (Signed)
   Subjective:    Patient ID: Justin Herrera, male    DOB: Feb 21, 1951, 69 y.o.   MRN: [id number]  HPI Here to follow up. He feels great. His BP is stable. He sleeps well. He has been enjoying fishing and hiking with his wife.    Review of Systems  Constitutional: Negative.   Respiratory: Negative.   Cardiovascular: Negative.   Psychiatric/Behavioral: Negative.        Objective:   Physical Exam Constitutional:      Appearance: Normal appearance.  Cardiovascular:     Rate and Rhythm: Normal rate and regular rhythm.     Pulses: Normal pulses.     Heart sounds: Normal heart sounds.  Pulmonary:     Effort: Pulmonary effort is normal.     Breath sounds: Normal breath sounds.  Neurological:     General: No focal deficit present.     Mental Status: He is alert and oriented to person, place, and time.  Psychiatric:        Mood and Affect: Mood normal.        Behavior: Behavior normal.        Thought Content: Thought content normal.        Judgment: Judgment normal.           Assessment & Plan:  His HTN, DM, insomnia and anxiety are stable. Check an A1c today. Alysia Penna, MD

## 2020-02-27 ENCOUNTER — Other Ambulatory Visit: Payer: Self-pay | Admitting: Family Medicine

## 2020-03-16 DIAGNOSIS — H903 Sensorineural hearing loss, bilateral: Secondary | ICD-10-CM | POA: Diagnosis not present

## 2020-04-11 DIAGNOSIS — Z7189 Other specified counseling: Secondary | ICD-10-CM | POA: Insufficient documentation

## 2020-04-11 DIAGNOSIS — R011 Cardiac murmur, unspecified: Secondary | ICD-10-CM | POA: Insufficient documentation

## 2020-04-11 NOTE — Progress Notes (Signed)
Cardiology Office Note   Date:  04/12/2020   ID:  Justin Herrera, DOB 1951/07/31, MRN [id number]  PCP:  Justin Morale, MD  Cardiologist:   Justin Breeding, MD   No chief complaint on file.   History of Present Illness: Justin Herrera is a 69 y.o. male who presents for follow up of an abnormal stress perfusion study in 2016 which suggested that his EF was perhaps about 48%. There was a medium defect of moderate severity in the basal inferior, basal inferolateral and inferolateral location with question peri-infarct ischemia. The patient was able to walk 8 minutes and it took a while to get his heart rate above 85% on the stress test.  He said he didn't have any symptoms.   In December of that year I sent him for an echocardiogram.  There was slight LV thickening but nothing that was clinically concerning. The LV did not show any evidence of a previous inferior infarct.  I managed him medically.    Since I last saw him in 2019 prior to cholecystectomy.  Since I last saw him he has done well.  He walks a couple miles a day with his dogs.  He denies any cardiovascular symptoms.   Past Medical History:  Diagnosis Date   Anxiety    Arthritis    Diabetes mellitus    Type II   GERD (gastroesophageal reflux disease)    Hypertension    Insomnia    Sleep apnea, obstructive    wears a CPAP   Symptomatic cholelithiasis     Past Surgical History:  Procedure Laterality Date   CHOLECYSTECTOMY N/A 03/28/2018   Procedure: LAPAROSCOPIC CHOLECYSTECTOMY WITH INTRAOPERATIVE CHOLANGIOGRAM;  Surgeon: [name], MD;  Location: [facility];  Service: General;  Laterality: N/A;   COLONOSCOPY  11-20-13   per Dr. Fuller Plan, adenomatous polyps, repeat in 5 yrs    CYSTECTOMY Right 2014   right wrist   KNEE SURGERY  1968   RETINAL TEAR REPAIR CRYOTHERAPY       Current Outpatient Medications  Medication Sig Dispense Refill   acetaminophen (TYLENOL) 500 MG tablet Take 1,000 mg by  mouth daily as needed for moderate pain or headache.     amLODipine-olmesartan (AZOR) 10-40 MG tablet Take 1 tablet by mouth daily. 90 tablet 3   aspirin 81 MG tablet Take 81 mg by mouth daily.       diclofenac (VOLTAREN) 75 MG EC tablet TAKE 1 TABLET(75 MG) BY MOUTH TWICE DAILY 180 tablet 3   esomeprazole (NEXIUM) 40 MG capsule TAKE 1 CAPSULE(40 MG) BY MOUTH DAILY 90 capsule 3   glipiZIDE (GLUCOTROL) 10 MG tablet TAKE 1 TABLET(10 MG) BY MOUTH TWICE DAILY BEFORE A MEAL 180 tablet 3   glucose blood (ONE TOUCH ULTRA TEST) test strip Once a day 100 each 2   Lancets (ONETOUCH ULTRASOFT) lancets Once a day 100 each 2   metFORMIN (GLUCOPHAGE) 500 MG tablet TAKE 1 TABLET(500 MG) BY MOUTH TWICE DAILY WITH A MEAL 180 tablet 3   metoprolol succinate (TOPROL-XL) 50 MG 24 hr tablet TAKE 1 TABLET BY MOUTH EVERY DAY WITH OR IMMEDIATELY FOLLOWING A MEAL 90 tablet 3   sertraline (ZOLOFT) 50 MG tablet TAKE 1 TABLET(50 MG) BY MOUTH DAILY 30 tablet 11   temazepam (RESTORIL) 30 MG capsule TAKE 1 CAPSULE(30 MG) BY MOUTH AT BEDTIME AS NEEDED FOR SLEEP 30 capsule 5   No current facility-administered medications for this visit.    Allergies:  Patient has no known allergies.    ROS:  Please see the history of present illness.   Otherwise, review of systems are positive for none.   All other systems are reviewed and negative.    PHYSICAL EXAM: VS:  BP (!) 160/92    Pulse 62    Ht 5\' 11"  (1.803 m)    Wt 236 lb (107 kg)    SpO2 98%    BMI 32.92 kg/m  , BMI Body mass index is 32.92 kg/m.  GENERAL:  Well appearing NECK:  No jugular venous distention, waveform within normal limits, carotid upstroke brisk and symmetric, no bruits, no thyromegaly LUNGS:  Clear to auscultation bilaterally CHEST:  Unremarkable HEART:  PMI not displaced or sustained,S1 and S2 within normal limits, no S3, no S4, no clicks, no rubs, very soft apical systolic murmur nonradiating brief and does not increase with the strain phase  of Valsalva murmurs ABD:  Flat, positive bowel sounds normal in frequency in pitch, no bruits, no rebound, no guarding, no midline pulsatile mass, no hepatomegaly, no splenomegaly EXT:  2 plus pulses throughout, no edema, no cyanosis no clubbing   EKG:  EKG is not ordered today. The ekg ordered  demonstrates sinus rhythm, rate 62, early transition in lead V2, no acute ST-T wave changes   Recent Labs: 08/17/2019: ALT 27; BUN 17; Creatinine, Ser 1.10; Potassium 4.5; Sodium 139; TSH 1.30    Lipid Panel    Component Value Date/Time   CHOL 160 08/17/2019 1525   TRIG 156.0 (H) 08/17/2019 1525   HDL 32.70 (L) 08/17/2019 1525   CHOLHDL 5 08/17/2019 1525   VLDL 31.2 08/17/2019 1525   LDLCALC 96 08/17/2019 1525   LDLDIRECT 109.1 03/29/2010 0804     Lab Results  Component Value Date   HGBA1C 6.6 (H) 02/24/2020    Wt Readings from Last 3 Encounters:  04/12/20 236 lb (107 kg)  02/24/20 236 lb 3.2 oz (107.1 kg)  08/17/19 233 lb 6.4 oz (105.9 kg)      Other studies Reviewed: Additional studies/ records that were reviewed today include: Labs Review of the above records demonstrates: See elsewhere   ASSESSMENT AND PLAN:  ABNORMAL STRESS TEST:   The patient has no new symptoms.  No further cardiovascular testing is suggested.  Postoperative sternal   MURMUR:     He has a soft systolic murmur.  There was on a previous echo some mild basal hypertrophy which may be causing SAM and the murmur.  This has not changed clinically.  I would not suggest further testing.  HTN: His blood pressure is elevated but he did not take his medications today.  He says typically in the 341P systolic.  No change in therapy.  OVERWEIGHT:    We talked again about weight.  COVID EDUCATION: He has had his vaccinations and we had a long discussion about the booster shots.   Current medicines are reviewed at length with the patient today.  The patient does not have concerns regarding medicines.  The  following changes have been made:  None  Labs/ tests ordered today include:  None  Orders Placed This Encounter  Procedures   EKG 12-Lead     Disposition:   FU with me in 24  months.     Signed, Justin Breeding, MD  04/12/2020 2:49 PM    [place]

## 2020-04-12 ENCOUNTER — Encounter: Payer: Self-pay | Admitting: Cardiology

## 2020-04-12 ENCOUNTER — Ambulatory Visit: Payer: Medicare PPO | Admitting: Cardiology

## 2020-04-12 ENCOUNTER — Other Ambulatory Visit: Payer: Self-pay

## 2020-04-12 VITALS — BP 160/92 | HR 62 | Ht 71.0 in | Wt 236.0 lb

## 2020-04-12 DIAGNOSIS — Z7189 Other specified counseling: Secondary | ICD-10-CM

## 2020-04-12 DIAGNOSIS — R9439 Abnormal result of other cardiovascular function study: Secondary | ICD-10-CM | POA: Diagnosis not present

## 2020-04-12 DIAGNOSIS — R011 Cardiac murmur, unspecified: Secondary | ICD-10-CM

## 2020-04-12 NOTE — Patient Instructions (Signed)
Medication Instructions:  No changes *If you need a refill on your cardiac medications before your next appointment, please call your pharmacy*  Lab Work: None ordered at this visit.  Testing/Procedures: None ordered at this visit  Follow-Up: At [facility], you and your health needs are our priority.  As part of our continuing mission to provide you with exceptional heart care, we have created designated Provider Care Teams.  These Care Teams include your primary Cardiologist (physician) and Advanced Practice Providers (APPs -  Physician Assistants and Nurse Practitioners) who all work together to provide you with the care you need, when you need it.  Your next appointment:   2 year(s)  You will receive a reminder letter in the mail two months in advance. If you don't receive a letter, please call our office to schedule the follow-up appointment.  The format for your next appointment:   In Person  Provider:   Minus Breeding, MD

## 2020-05-25 ENCOUNTER — Other Ambulatory Visit: Payer: Self-pay

## 2020-05-25 ENCOUNTER — Ambulatory Visit: Payer: Medicare PPO | Admitting: Family Medicine

## 2020-05-25 ENCOUNTER — Encounter: Payer: Self-pay | Admitting: Family Medicine

## 2020-05-25 VITALS — BP 160/90 | HR 72 | Temp 98.0°F | Ht 71.0 in | Wt 241.8 lb

## 2020-05-25 DIAGNOSIS — Z23 Encounter for immunization: Secondary | ICD-10-CM

## 2020-05-25 DIAGNOSIS — B359 Dermatophytosis, unspecified: Secondary | ICD-10-CM | POA: Diagnosis not present

## 2020-05-25 MED ORDER — KETOCONAZOLE 2 % EX CREA: 1.0000 "application " | TOPICAL_CREAM | Freq: Two times a day (BID) | CUTANEOUS | 2 refills | Status: DC

## 2020-05-25 NOTE — Progress Notes (Signed)
   Subjective:    Patient ID: Justin Herrera, male    DOB: 08-12-1951, 69 y.o.   MRN: [id number]  HPI Here for a red spot on the right shin that appeared 3 weeks ago. It has slowly gotten larger, but there are no other symptoms. Using Neosporin.    Review of Systems  Constitutional: Negative.   Respiratory: Negative.   Cardiovascular: Negative.   Skin: Positive for rash.       Objective:   Physical Exam Constitutional:      Appearance: Normal appearance.  Cardiovascular:     Rate and Rhythm: Normal rate and regular rhythm.     Pulses: Normal pulses.     Heart sounds: Normal heart sounds.  Pulmonary:     Effort: Pulmonary effort is normal.     Breath sounds: Normal breath sounds.  Skin:    Comments: There is an annular red macular scaly patch on the right lower leg   Neurological:     Mental Status: He is alert.           Assessment & Plan:  Ringworm, treatr with Ketoconazole cream BID.  Alysia Penna, MD

## 2020-05-25 NOTE — Addendum Note (Signed)
Addended by: Matilde Sprang on: 05/25/2020 11:57 AM   Modules accepted: Orders

## 2020-07-26 ENCOUNTER — Other Ambulatory Visit: Payer: Self-pay | Admitting: Family Medicine

## 2020-07-28 DIAGNOSIS — L814 Other melanin hyperpigmentation: Secondary | ICD-10-CM | POA: Diagnosis not present

## 2020-07-28 DIAGNOSIS — D229 Melanocytic nevi, unspecified: Secondary | ICD-10-CM | POA: Diagnosis not present

## 2020-07-28 DIAGNOSIS — L821 Other seborrheic keratosis: Secondary | ICD-10-CM | POA: Diagnosis not present

## 2020-07-28 DIAGNOSIS — L218 Other seborrheic dermatitis: Secondary | ICD-10-CM | POA: Diagnosis not present

## 2020-07-28 DIAGNOSIS — L570 Actinic keratosis: Secondary | ICD-10-CM | POA: Diagnosis not present

## 2020-07-28 DIAGNOSIS — L578 Other skin changes due to chronic exposure to nonionizing radiation: Secondary | ICD-10-CM | POA: Diagnosis not present

## 2020-07-28 DIAGNOSIS — L819 Disorder of pigmentation, unspecified: Secondary | ICD-10-CM | POA: Diagnosis not present

## 2020-08-23 ENCOUNTER — Other Ambulatory Visit: Payer: Self-pay

## 2020-08-23 ENCOUNTER — Ambulatory Visit (INDEPENDENT_AMBULATORY_CARE_PROVIDER_SITE_OTHER): Payer: Medicare PPO | Admitting: Family Medicine

## 2020-08-23 ENCOUNTER — Encounter: Payer: Self-pay | Admitting: Family Medicine

## 2020-08-23 VITALS — BP 154/78 | HR 70 | Ht 71.0 in | Wt 238.0 lb

## 2020-08-23 DIAGNOSIS — Z0000 Encounter for general adult medical examination without abnormal findings: Secondary | ICD-10-CM | POA: Diagnosis not present

## 2020-08-23 DIAGNOSIS — E119 Type 2 diabetes mellitus without complications: Secondary | ICD-10-CM

## 2020-08-23 LAB — CBC WITH DIFFERENTIAL/PLATELET
Basophils Absolute: 0.1 10*3/uL (ref 0.0–0.1)
Basophils Relative: 1 % (ref 0.0–3.0)
Eosinophils Absolute: 0.3 10*3/uL (ref 0.0–0.7)
Eosinophils Relative: 4.4 % (ref 0.0–5.0)
HCT: 42.5 % (ref 39.0–52.0)
Hemoglobin: 15.3 g/dL (ref 13.0–17.0)
Lymphocytes Relative: 20.7 % (ref 12.0–46.0)
Lymphs Abs: 1.3 10*3/uL (ref 0.7–4.0)
MCHC: 36 g/dL (ref 30.0–36.0)
MCV: 85.9 fl (ref 78.0–100.0)
Monocytes Absolute: 0.7 10*3/uL (ref 0.1–1.0)
Monocytes Relative: 10.8 % (ref 3.0–12.0)
Neutro Abs: 4.1 10*3/uL (ref 1.4–7.7)
Neutrophils Relative %: 63.1 % (ref 43.0–77.0)
Platelets: 231 10*3/uL (ref 150.0–400.0)
RBC: 4.95 Mil/uL (ref 4.22–5.81)
RDW: 13.1 % (ref 11.5–15.5)
WBC: 6.4 10*3/uL (ref 4.0–10.5)

## 2020-08-23 LAB — BASIC METABOLIC PANEL
BUN: 17 mg/dL (ref 6–23)
CO2: 29 mEq/L (ref 19–32)
Calcium: 10.1 mg/dL (ref 8.4–10.5)
Chloride: 102 mEq/L (ref 96–112)
Creatinine, Ser: 1.24 mg/dL (ref 0.40–1.50)
GFR: 59.67 mL/min — ABNORMAL LOW (ref >60.00)
Glucose, Bld: 153 mg/dL — ABNORMAL HIGH (ref 70–99)
Potassium: 4.3 mEq/L (ref 3.5–5.1)
Sodium: 138 mEq/L (ref 135–145)

## 2020-08-23 LAB — HEPATIC FUNCTION PANEL
ALT: 35 U/L (ref 0–53)
AST: 21 U/L (ref 0–37)
Albumin: 4.4 g/dL (ref 3.5–5.2)
Alkaline Phosphatase: 49 U/L (ref 39–117)
Bilirubin, Direct: 0.1 mg/dL (ref 0.0–0.3)
Total Bilirubin: 0.5 mg/dL (ref 0.2–1.2)
Total Protein: 6.8 g/dL (ref 6.0–8.3)

## 2020-08-23 LAB — LIPID PANEL
Cholesterol: 159 mg/dL (ref 0–200)
HDL: 29.9 mg/dL — ABNORMAL LOW (ref >39.00)
NonHDL: 129.19
Total CHOL/HDL Ratio: 5
Triglycerides: 296 mg/dL — ABNORMAL HIGH (ref 0.0–149.0)
VLDL: 59.2 mg/dL — ABNORMAL HIGH (ref 0.0–40.0)

## 2020-08-23 LAB — PSA: PSA: 0.56 ng/mL (ref 0.10–4.00)

## 2020-08-23 LAB — TSH: TSH: 3.24 u[IU]/mL (ref 0.35–4.50)

## 2020-08-23 LAB — LDL CHOLESTEROL, DIRECT: Direct LDL: 98 mg/dL

## 2020-08-23 LAB — HEMOGLOBIN A1C: Hgb A1c MFr Bld: 7.2 % — ABNORMAL HIGH (ref 4.6–6.5)

## 2020-08-23 MED ORDER — AMLODIPINE-OLMESARTAN 10-40 MG PO TABS
1.0000 | ORAL_TABLET | Freq: Every day | ORAL | 3 refills | Status: DC
Start: 2020-08-23 — End: 2021-08-23

## 2020-08-23 MED ORDER — METOPROLOL SUCCINATE ER 100 MG PO TB24: 100.0000 mg | ORAL_TABLET | Freq: Every day | ORAL | 3 refills | Status: DC

## 2020-08-23 MED ORDER — TEMAZEPAM 30 MG PO CAPS
30.0000 mg | ORAL_CAPSULE | Freq: Every day | ORAL | 5 refills | Status: DC
Start: 2020-08-23 — End: 2021-03-02

## 2020-08-23 NOTE — Progress Notes (Signed)
Subjective:    Patient ID: Justin Herrera, male    DOB: 1951-01-09, 69 y.o.   MRN: [id number]  HPI Here for a well exam. He feels fine except for some joint pains. He says taking glucosamine helps. He is past due for a colonoscopy. His BP at home has been high with systolics in the range of 140s to 160s.    Review of Systems  Constitutional: Negative.   HENT: Negative.   Eyes: Negative.   Respiratory: Negative.   Cardiovascular: Negative.   Gastrointestinal: Negative.   Genitourinary: Negative.   Musculoskeletal: Positive for arthralgias.  Skin: Negative.   Neurological: Negative.   Psychiatric/Behavioral: Negative.        Objective:   Physical Exam Constitutional:      General: He is not in acute distress.    Appearance: He is well-developed and well-nourished. He is obese. He is not diaphoretic.  HENT:     Head: Normocephalic and atraumatic.     Right Ear: External ear normal.     Left Ear: External ear normal.     Nose: Nose normal.     Mouth/Throat:     Mouth: Oropharynx is clear and moist.     Pharynx: No oropharyngeal exudate.  Eyes:     General: No scleral icterus.       Right eye: No discharge.        Left eye: No discharge.     Extraocular Movements: EOM normal.     Conjunctiva/sclera: Conjunctivae normal.     Pupils: Pupils are equal, round, and reactive to light.  Neck:     Thyroid: No thyromegaly.     Vascular: No JVD.     Trachea: No tracheal deviation.  Cardiovascular:     Rate and Rhythm: Normal rate and regular rhythm.     Pulses: Intact distal pulses.     Heart sounds: Normal heart sounds. No murmur heard. No friction rub. No gallop.   Pulmonary:     Effort: Pulmonary effort is normal. No respiratory distress.     Breath sounds: Normal breath sounds. No wheezing or rales.  Chest:     Chest wall: No tenderness.  Abdominal:     General: Bowel sounds are normal. There is no distension.     Palpations: Abdomen is soft. There is no mass.      Tenderness: There is no abdominal tenderness. There is no guarding or rebound.  Genitourinary:    Penis: Normal. No tenderness.      Testes: Normal.     Prostate: Normal.     Rectum: Normal. Guaiac result negative.  Musculoskeletal:        General: No tenderness or edema. Normal range of motion.     Cervical back: Neck supple.  Lymphadenopathy:     Cervical: No cervical adenopathy.  Skin:    General: Skin is warm and dry.     Coloration: Skin is not pale.     Findings: No erythema or rash.  Neurological:     Mental Status: He is alert and oriented to person, place, and time.     Cranial Nerves: No cranial nerve deficit.     Motor: No abnormal muscle tone.     Coordination: Coordination normal.     Deep Tendon Reflexes: Reflexes are normal and symmetric. Reflexes normal.  Psychiatric:        Mood and Affect: Mood and affect normal.        Behavior: Behavior normal.  Thought Content: Thought content normal.        Judgment: Judgment normal.           Assessment & Plan:  Well exam. We discussed diet and exercise. Get fasting labs. For the HTN we will increase Metoprolol succinate to 100 mg daily. He also needs to lose some weight.  Gershon Crane, MD

## 2020-09-07 DIAGNOSIS — L578 Other skin changes due to chronic exposure to nonionizing radiation: Secondary | ICD-10-CM | POA: Diagnosis not present

## 2020-09-07 DIAGNOSIS — L300 Nummular dermatitis: Secondary | ICD-10-CM | POA: Diagnosis not present

## 2020-09-14 DIAGNOSIS — E119 Type 2 diabetes mellitus without complications: Secondary | ICD-10-CM | POA: Diagnosis not present

## 2020-09-14 LAB — HM DIABETES EYE EXAM

## 2020-09-16 ENCOUNTER — Encounter: Payer: Self-pay | Admitting: Family Medicine

## 2020-09-22 DIAGNOSIS — H903 Sensorineural hearing loss, bilateral: Secondary | ICD-10-CM | POA: Diagnosis not present

## 2020-10-25 ENCOUNTER — Other Ambulatory Visit: Payer: Self-pay | Admitting: Family Medicine

## 2020-11-24 ENCOUNTER — Other Ambulatory Visit: Payer: Self-pay

## 2020-11-25 ENCOUNTER — Ambulatory Visit: Payer: Medicare PPO | Admitting: Family Medicine

## 2020-11-25 ENCOUNTER — Encounter: Payer: Self-pay | Admitting: Family Medicine

## 2020-11-25 VITALS — BP 148/78 | HR 58 | Temp 98.1°F | Wt 233.2 lb

## 2020-11-25 DIAGNOSIS — I10 Essential (primary) hypertension: Secondary | ICD-10-CM | POA: Diagnosis not present

## 2020-11-25 MED ORDER — HYDROCHLOROTHIAZIDE 25 MG PO TABS: 25.0000 mg | ORAL_TABLET | Freq: Every day | ORAL | 3 refills | Status: DC

## 2020-11-25 NOTE — Progress Notes (Signed)
   Subjective:    Patient ID: Justin Herrera, male    DOB: 09/02/1950, 70 y.o.   MRN: [id number]  HPI Here for elevated BP. He feels fine. His diastolic readings are stable, but the systolic is often in the 846-659 range.    Review of Systems  Constitutional: Negative.   Respiratory: Negative.   Cardiovascular: Negative.   Neurological: Negative.        Objective:   Physical Exam Constitutional:      Appearance: Normal appearance.  Cardiovascular:     Rate and Rhythm: Normal rate and regular rhythm.     Pulses: Normal pulses.     Heart sounds: Normal heart sounds.  Pulmonary:     Effort: Pulmonary effort is normal.     Breath sounds: Normal breath sounds.  Neurological:     Mental Status: He is alert.           Assessment & Plan:  HTN, we will add HCTZ 25 mg daily to his regimen. Recheck in one month.  Alysia Penna, MD

## 2020-12-02 ENCOUNTER — Ambulatory Visit: Payer: Medicare PPO

## 2020-12-03 DIAGNOSIS — Z20822 Contact with and (suspected) exposure to covid-19: Secondary | ICD-10-CM | POA: Diagnosis not present

## 2021-03-01 ENCOUNTER — Other Ambulatory Visit: Payer: Self-pay | Admitting: Family Medicine

## 2021-03-07 DIAGNOSIS — L814 Other melanin hyperpigmentation: Secondary | ICD-10-CM | POA: Diagnosis not present

## 2021-03-07 DIAGNOSIS — L570 Actinic keratosis: Secondary | ICD-10-CM | POA: Diagnosis not present

## 2021-03-07 DIAGNOSIS — D229 Melanocytic nevi, unspecified: Secondary | ICD-10-CM | POA: Diagnosis not present

## 2021-03-07 DIAGNOSIS — L821 Other seborrheic keratosis: Secondary | ICD-10-CM | POA: Diagnosis not present

## 2021-03-24 ENCOUNTER — Other Ambulatory Visit: Payer: Self-pay | Admitting: Family Medicine

## 2021-06-07 ENCOUNTER — Other Ambulatory Visit: Payer: Self-pay

## 2021-06-07 ENCOUNTER — Ambulatory Visit (INDEPENDENT_AMBULATORY_CARE_PROVIDER_SITE_OTHER): Payer: Medicare PPO

## 2021-06-07 DIAGNOSIS — Z23 Encounter for immunization: Secondary | ICD-10-CM

## 2021-06-08 ENCOUNTER — Telehealth: Payer: Self-pay

## 2021-06-08 NOTE — Telephone Encounter (Signed)
Last AWV & preventative exam 08/23/20. Pt notified of above & appt scheduled for 08/23/21.

## 2021-07-12 ENCOUNTER — Other Ambulatory Visit: Payer: Self-pay | Admitting: Family Medicine

## 2021-08-11 ENCOUNTER — Other Ambulatory Visit: Payer: Self-pay | Admitting: Family Medicine

## 2021-08-23 ENCOUNTER — Ambulatory Visit (INDEPENDENT_AMBULATORY_CARE_PROVIDER_SITE_OTHER): Payer: Medicare PPO

## 2021-08-23 ENCOUNTER — Other Ambulatory Visit: Payer: Self-pay

## 2021-08-23 ENCOUNTER — Ambulatory Visit (AMBULATORY_SURGERY_CENTER): Payer: Medicare PPO

## 2021-08-23 ENCOUNTER — Ambulatory Visit (INDEPENDENT_AMBULATORY_CARE_PROVIDER_SITE_OTHER): Payer: Medicare PPO | Admitting: Family Medicine

## 2021-08-23 ENCOUNTER — Encounter: Payer: Self-pay | Admitting: Family Medicine

## 2021-08-23 VITALS — Ht 71.0 in | Wt 220.0 lb

## 2021-08-23 VITALS — BP 130/88 | HR 59 | Temp 98.1°F | Ht 71.0 in | Wt 230.0 lb

## 2021-08-23 DIAGNOSIS — M5136 Other intervertebral disc degeneration, lumbar region: Secondary | ICD-10-CM | POA: Diagnosis not present

## 2021-08-23 DIAGNOSIS — M5441 Lumbago with sciatica, right side: Secondary | ICD-10-CM

## 2021-08-23 DIAGNOSIS — G8929 Other chronic pain: Secondary | ICD-10-CM | POA: Insufficient documentation

## 2021-08-23 DIAGNOSIS — M545 Low back pain, unspecified: Secondary | ICD-10-CM | POA: Diagnosis not present

## 2021-08-23 DIAGNOSIS — Z0000 Encounter for general adult medical examination without abnormal findings: Secondary | ICD-10-CM

## 2021-08-23 DIAGNOSIS — M47816 Spondylosis without myelopathy or radiculopathy, lumbar region: Secondary | ICD-10-CM | POA: Diagnosis not present

## 2021-08-23 DIAGNOSIS — Z125 Encounter for screening for malignant neoplasm of prostate: Secondary | ICD-10-CM

## 2021-08-23 DIAGNOSIS — N1832 Chronic kidney disease, stage 3b: Secondary | ICD-10-CM

## 2021-08-23 DIAGNOSIS — Z86010 Personal history of colonic polyps: Secondary | ICD-10-CM

## 2021-08-23 MED ORDER — METOPROLOL SUCCINATE ER 100 MG PO TB24: 100.0000 mg | ORAL_TABLET | Freq: Every day | ORAL | 3 refills | Status: DC

## 2021-08-23 MED ORDER — CYCLOBENZAPRINE HCL 10 MG PO TABS: 10.0000 mg | ORAL_TABLET | Freq: Three times a day (TID) | ORAL | 5 refills | Status: DC | PRN

## 2021-08-23 MED ORDER — AMLODIPINE-OLMESARTAN 10-40 MG PO TABS: 1.0000 | ORAL_TABLET | Freq: Every day | ORAL | 3 refills | Status: DC

## 2021-08-23 MED ORDER — NA SULFATE-K SULFATE-MG SULF 17.5-3.13-1.6 GM/177ML PO SOLN: 1.0000 | Freq: Once | ORAL | 0 refills | Status: AC

## 2021-08-23 MED ORDER — TEMAZEPAM 30 MG PO CAPS: ORAL_CAPSULE | ORAL | 5 refills | Status: DC

## 2021-08-23 NOTE — Progress Notes (Signed)
Pre visit completed via phone call; Patient verified name, DOB, and address; No egg or soy allergy known to patient  No issues known to pt with past sedation with any surgeries or procedures Patient denies ever being told they had issues or difficulty with intubation  No FH of Malignant Hyperthermia Pt is not on diet pills Pt is not on home 02  Pt is not on blood thinners  Pt denies issues with constipation;  No A fib or A flutter Pt is fully vaccinated for Covid x 2 + boosters; NO PA's for preps discussed with pt in PV today  Discussed with pt there will be an out-of-pocket cost for prep and that varies from $0 to 70 +  dollars - pt verbalized understanding  Due to the COVID-19 pandemic we are asking patients to follow certain guidelines in PV and the [place]   Pt aware of COVID protocols and [facility] guidelines

## 2021-08-23 NOTE — Progress Notes (Signed)
Subjective:    Patient ID: Justin Herrera, male    DOB: [birth year], 70 y.o.   MRN: [id number]  HPI Here for a well exam. He feels well except for low back pain that started about 6 months ago. The pain has gotten more intense over time, and now it radiates down the right leg. No numbness or weakness in the leg. He normally takes Diclofenac on a regular basis, but this only gives partial relief.    Review of Systems  Constitutional: Negative.   HENT: Negative.    Eyes: Negative.   Respiratory: Negative.    Cardiovascular: Negative.   Gastrointestinal: Negative.   Genitourinary: Negative.   Musculoskeletal:  Positive for back pain.  Skin: Negative.   Neurological: Negative.   Psychiatric/Behavioral: Negative.        Objective:   Physical Exam Constitutional:      General: He is not in acute distress.    Appearance: Normal appearance. He is well-developed. He is not diaphoretic.  HENT:     Head: Normocephalic and atraumatic.     Right Ear: External ear normal.     Left Ear: External ear normal.     Nose: Nose normal.     Mouth/Throat:     Pharynx: No oropharyngeal exudate.  Eyes:     General: No scleral icterus.       Right eye: No discharge.        Left eye: No discharge.     Conjunctiva/sclera: Conjunctivae normal.     Pupils: Pupils are equal, round, and reactive to light.  Neck:     Thyroid: No thyromegaly.     Vascular: No JVD.     Trachea: No tracheal deviation.  Cardiovascular:     Rate and Rhythm: Normal rate and regular rhythm.     Heart sounds: Normal heart sounds. No murmur heard.   No friction rub. No gallop.  Pulmonary:     Effort: Pulmonary effort is normal. No respiratory distress.     Breath sounds: Normal breath sounds. No wheezing or rales.  Chest:     Chest wall: No tenderness.  Abdominal:     General: Bowel sounds are normal. There is no distension.     Palpations: Abdomen is soft. There is no mass.     Tenderness: There is no abdominal  tenderness. There is no guarding or rebound.  Genitourinary:    Penis: Normal. No tenderness.      Testes: Normal.     Prostate: Normal.     Rectum: Normal. Guaiac result negative.  Musculoskeletal:        General: Normal range of motion.     Cervical back: Neck supple.     Comments: He is tender over the right lower back and the right sciatic notch. Full ROM. Negative SLR.  Lymphadenopathy:     Cervical: No cervical adenopathy.  Skin:    General: Skin is warm and dry.     Coloration: Skin is not pale.     Findings: No erythema or rash.  Neurological:     Mental Status: He is alert and oriented to person, place, and time.     Cranial Nerves: No cranial nerve deficit.     Motor: No abnormal muscle tone.     Coordination: Coordination normal.     Deep Tendon Reflexes: Reflexes are normal and symmetric. Reflexes normal.  Psychiatric:        Behavior: Behavior normal.  Thought Content: Thought content normal.        Judgment: Judgment normal.          Assessment & Plan:  Well exam. We discussed diet and exercise. Get fasting labs. For the back pain, he can take up to 3000 mg a day of Tylenol. We will also add Flexeril as needed. We will get Xrays of the lumbar spine today. He is scheduled for another colonoscopy on 09-04-21.  Alysia Penna, MD

## 2021-08-24 LAB — LIPID PANEL
Cholesterol: 168 mg/dL (ref 0–200)
HDL: 30 mg/dL — ABNORMAL LOW (ref >39.00)
NonHDL: 137.5
Total CHOL/HDL Ratio: 6
Triglycerides: 277 mg/dL — ABNORMAL HIGH (ref 0.0–149.0)
VLDL: 55.4 mg/dL — ABNORMAL HIGH (ref 0.0–40.0)

## 2021-08-24 LAB — CBC WITH DIFFERENTIAL/PLATELET
Basophils Absolute: 0.1 10*3/uL (ref 0.0–0.1)
Basophils Relative: 1 % (ref 0.0–3.0)
Eosinophils Absolute: 0.1 10*3/uL (ref 0.0–0.7)
Eosinophils Relative: 2.1 % (ref 0.0–5.0)
HCT: 42.3 % (ref 39.0–52.0)
Hemoglobin: 14.5 g/dL (ref 13.0–17.0)
Lymphocytes Relative: 22.8 % (ref 12.0–46.0)
Lymphs Abs: 1.4 10*3/uL (ref 0.7–4.0)
MCHC: 34.3 g/dL (ref 30.0–36.0)
MCV: 87.6 fl (ref 78.0–100.0)
Monocytes Absolute: 0.6 10*3/uL (ref 0.1–1.0)
Monocytes Relative: 9.2 % (ref 3.0–12.0)
Neutro Abs: 4 10*3/uL (ref 1.4–7.7)
Neutrophils Relative %: 64.9 % (ref 43.0–77.0)
Platelets: 221 10*3/uL (ref 150.0–400.0)
RBC: 4.83 Mil/uL (ref 4.22–5.81)
RDW: 13.4 % (ref 11.5–15.5)
WBC: 6.2 10*3/uL (ref 4.0–10.5)

## 2021-08-24 LAB — HEPATIC FUNCTION PANEL
ALT: 24 U/L (ref 0–53)
AST: 19 U/L (ref 0–37)
Albumin: 4.5 g/dL (ref 3.5–5.2)
Alkaline Phosphatase: 40 U/L (ref 39–117)
Bilirubin, Direct: 0.1 mg/dL (ref 0.0–0.3)
Total Bilirubin: 0.7 mg/dL (ref 0.2–1.2)
Total Protein: 7 g/dL (ref 6.0–8.3)

## 2021-08-24 LAB — BASIC METABOLIC PANEL
BUN: 30 mg/dL — ABNORMAL HIGH (ref 6–23)
CO2: 29 mEq/L (ref 19–32)
Calcium: 10.3 mg/dL (ref 8.4–10.5)
Chloride: 100 mEq/L (ref 96–112)
Creatinine, Ser: 1.82 mg/dL — ABNORMAL HIGH (ref 0.40–1.50)
GFR: 37.39 mL/min — ABNORMAL LOW (ref >60.00)
Glucose, Bld: 120 mg/dL — ABNORMAL HIGH (ref 70–99)
Potassium: 4.7 mEq/L (ref 3.5–5.1)
Sodium: 138 mEq/L (ref 135–145)

## 2021-08-24 LAB — LDL CHOLESTEROL, DIRECT: Direct LDL: 111 mg/dL

## 2021-08-24 LAB — HEMOGLOBIN A1C: Hgb A1c MFr Bld: 6.8 % — ABNORMAL HIGH (ref 4.6–6.5)

## 2021-08-24 LAB — PSA: PSA: 0.83 ng/mL (ref 0.10–4.00)

## 2021-08-24 LAB — TSH: TSH: 1.69 u[IU]/mL (ref 0.35–5.50)

## 2021-08-25 ENCOUNTER — Other Ambulatory Visit: Payer: Self-pay

## 2021-08-25 NOTE — Addendum Note (Signed)
Addended by: Alysia Penna A on: 08/25/2021 04:35 PM   Modules accepted: Orders

## 2021-08-30 ENCOUNTER — Encounter: Payer: Self-pay | Admitting: Gastroenterology

## 2021-09-01 ENCOUNTER — Other Ambulatory Visit: Payer: Self-pay

## 2021-09-01 DIAGNOSIS — N289 Disorder of kidney and ureter, unspecified: Secondary | ICD-10-CM

## 2021-09-04 ENCOUNTER — Other Ambulatory Visit: Payer: Self-pay

## 2021-09-04 ENCOUNTER — Encounter: Payer: Self-pay | Admitting: Gastroenterology

## 2021-09-04 ENCOUNTER — Ambulatory Visit (AMBULATORY_SURGERY_CENTER): Payer: Medicare PPO | Admitting: Gastroenterology

## 2021-09-04 VITALS — BP 125/74 | HR 55 | Temp 97.3°F | Resp 16 | Ht 71.0 in | Wt 220.0 lb

## 2021-09-04 DIAGNOSIS — Z86010 Personal history of colonic polyps: Secondary | ICD-10-CM | POA: Diagnosis not present

## 2021-09-04 DIAGNOSIS — K635 Polyp of colon: Secondary | ICD-10-CM | POA: Diagnosis not present

## 2021-09-04 DIAGNOSIS — D123 Benign neoplasm of transverse colon: Secondary | ICD-10-CM

## 2021-09-04 HISTORY — PX: COLONOSCOPY: SHX174

## 2021-09-04 MED ORDER — SODIUM CHLORIDE 0.9 % IV SOLN: 500.0000 mL | Freq: Once | INTRAVENOUS | Status: DC

## 2021-09-04 NOTE — Progress Notes (Signed)
To Pacu, VSS. Report to [email or web address] 

## 2021-09-04 NOTE — Progress Notes (Signed)
Vitals-DT  Pt's states no medical or surgical changes since previsit or office visit.  

## 2021-09-04 NOTE — Patient Instructions (Signed)
Please read handouts provided. Continue present medications. Await pathology results. High Fiber Diet.   YOU HAD AN ENDOSCOPIC PROCEDURE TODAY AT [facility]:   Refer to the procedure report that was given to you for any specific questions about what was found during the examination.  If the procedure report does not answer your questions, please call your gastroenterologist to clarify.  If you requested that your care partner not be given the details of your procedure findings, then the procedure report has been included in a sealed envelope for you to review at your convenience later.  YOU SHOULD EXPECT: Some feelings of bloating in the abdomen. Passage of more gas than usual.  Walking can help get rid of the air that was put into your GI tract during the procedure and reduce the bloating. If you had a lower endoscopy (such as a colonoscopy or flexible sigmoidoscopy) you may notice spotting of blood in your stool or on the toilet paper. If you underwent a bowel prep for your procedure, you may not have a normal bowel movement for a few days.  Please Note:  You might notice some irritation and congestion in your nose or some drainage.  This is from the oxygen used during your procedure.  There is no need for concern and it should clear up in a day or so.  SYMPTOMS TO REPORT IMMEDIATELY:  Following lower endoscopy (colonoscopy or flexible sigmoidoscopy):  Excessive amounts of blood in the stool  Significant tenderness or worsening of abdominal pains  Swelling of the abdomen that is new, acute  Fever of 100F or higher   For urgent or emergent issues, a gastroenterologist can be reached at any hour by calling [phone]. Do not use MyChart messaging for urgent concerns.    DIET:  We do recommend a small meal at first, but then you may proceed to your regular diet.  Drink plenty of fluids but you should avoid alcoholic beverages for 24 hours.  ACTIVITY:  You should plan  to take it easy for the rest of today and you should NOT DRIVE or use heavy machinery until tomorrow (because of the sedation medicines used during the test).    FOLLOW UP: Our staff will call the number listed on your records 48-72 hours following your procedure to check on you and address any questions or concerns that you may have regarding the information given to you following your procedure. If we do not reach you, we will leave a message.  We will attempt to reach you two times.  During this call, we will ask if you have developed any symptoms of COVID 19. If you develop any symptoms (ie: fever, flu-like symptoms, shortness of breath, cough etc.) before then, please call [phone].  If you test positive for Covid 19 in the 2 weeks post procedure, please call and report this information to [place].    If any biopsies were taken you will be contacted by phone or by letter within the next 1-3 weeks.  Please call us at [phone] if you have not heard about the biopsies in 3 weeks.    SIGNATURES/CONFIDENTIALITY: You and/or your care partner have signed paperwork which will be entered into your electronic medical record.  These signatures attest to the fact that that the information above on your After Visit Summary has been reviewed and is understood.  Full responsibility of the confidentiality of this discharge information lies with you and/or your care-partner.

## 2021-09-04 NOTE — Progress Notes (Signed)
History & Physical  Primary Care Physician:  Laurey Morale, MD Primary Gastroenterologist: Lucio Edward, MD  CHIEF COMPLAINT:  Personal history of colon polyps   HPI: Justin Herrera is a 71 y.o. male with a personal history of adenomatous colon polyps on last colonoscopy in 2015 here for surveillance colonoscopy.    Past Medical History:  Diagnosis Date   Anxiety    on meds   Arthritis    generalized   Depression    on meds   Diabetes mellitus    Type II-on meds   GERD (gastroesophageal reflux disease)    Heart murmur    slight murmur noted per pt   Hypertension    on meds   Insomnia    Sleep apnea, obstructive    wears a CPAP   Symptomatic cholelithiasis     Past Surgical History:  Procedure Laterality Date   CHOLECYSTECTOMY N/A 03/28/2018   Procedure: LAPAROSCOPIC CHOLECYSTECTOMY WITH INTRAOPERATIVE CHOLANGIOGRAM;  Surgeon: [name], MD;  Location: [facility];  Service: General;  Laterality: N/A;   COLONOSCOPY  11/20/2013   per Dr. Fuller Plan, adenomatous polyps x 2, repeat in 5 yrs  (movi(prep good)   CYSTECTOMY Right 2014   right wrist   [facility]      Prior to Admission medications   Medication Sig Start Date End Date Taking? Authorizing Provider  acetaminophen (TYLENOL) 500 MG tablet Take 1,000 mg by mouth daily as needed for moderate pain or headache.   Yes [provider]  amLODipine-olmesartan (AZOR) 10-40 MG tablet Take 1 tablet by mouth daily. 08/23/21  Yes Laurey Morale, MD  aspirin 81 MG tablet Take 81 mg by mouth daily.   Yes [provider]  diclofenac (VOLTAREN) 75 MG EC tablet TAKE 1 TABLET(75 MG) BY MOUTH TWICE DAILY 07/12/21  Yes Laurey Morale, MD  esomeprazole (NEXIUM) 40 MG capsule TAKE 1 CAPSULE(40 MG) BY MOUTH DAILY 07/12/21  Yes Laurey Morale, MD  glipiZIDE (GLUCOTROL) 10 MG tablet TAKE 1 TABLET(10 MG) BY MOUTH TWICE DAILY BEFORE A MEAL 08/11/21  Yes Laurey Morale, MD   hydrochlorothiazide (HYDRODIURIL) 25 MG tablet Take 1 tablet (25 mg total) by mouth daily. 11/25/20  Yes Laurey Morale, MD  metFORMIN (GLUCOPHAGE) 500 MG tablet TAKE 1 TABLET(500 MG) BY MOUTH TWICE DAILY WITH A MEAL 07/12/21  Yes Laurey Morale, MD  metoprolol succinate (TOPROL-XL) 100 MG 24 hr tablet Take 1 tablet (100 mg total) by mouth daily. Take with or immediately following a meal. 08/23/21  Yes Laurey Morale, MD  sertraline (ZOLOFT) 50 MG tablet TAKE 1 TABLET(50 MG) BY MOUTH DAILY 07/12/21  Yes Laurey Morale, MD  temazepam (RESTORIL) 30 MG capsule TAKE 1 CAPSULE(30 MG) BY MOUTH AT BEDTIME 08/23/21  Yes Laurey Morale, MD  cyclobenzaprine (FLEXERIL) 10 MG tablet Take 1 tablet (10 mg total) by mouth 3 (three) times daily as needed for muscle spasms. 08/23/21   Laurey Morale, MD  glucose blood (ONE TOUCH ULTRA TEST) test strip Once a day 07/20/15   Laurey Morale, MD  Lancets [facility] ULTRASOFT) lancets Once a day 07/20/15   Laurey Morale, MD    Current Outpatient Medications  Medication Sig Dispense Refill   acetaminophen (TYLENOL) 500 MG tablet Take 1,000 mg by mouth daily as needed for moderate pain or headache.     amLODipine-olmesartan (AZOR) 10-40 MG tablet Take 1 tablet by mouth  daily. 90 tablet 3   aspirin 81 MG tablet Take 81 mg by mouth daily.     diclofenac (VOLTAREN) 75 MG EC tablet TAKE 1 TABLET(75 MG) BY MOUTH TWICE DAILY 180 tablet 3   esomeprazole (NEXIUM) 40 MG capsule TAKE 1 CAPSULE(40 MG) BY MOUTH DAILY 90 capsule 3   glipiZIDE (GLUCOTROL) 10 MG tablet TAKE 1 TABLET(10 MG) BY MOUTH TWICE DAILY BEFORE A MEAL 180 tablet 0   hydrochlorothiazide (HYDRODIURIL) 25 MG tablet Take 1 tablet (25 mg total) by mouth daily. 90 tablet 3   metFORMIN (GLUCOPHAGE) 500 MG tablet TAKE 1 TABLET(500 MG) BY MOUTH TWICE DAILY WITH A MEAL 180 tablet 3   metoprolol succinate (TOPROL-XL) 100 MG 24 hr tablet Take 1 tablet (100 mg total) by mouth daily. Take with or immediately following a meal.  90 tablet 3   sertraline (ZOLOFT) 50 MG tablet TAKE 1 TABLET(50 MG) BY MOUTH DAILY 30 tablet 3   temazepam (RESTORIL) 30 MG capsule TAKE 1 CAPSULE(30 MG) BY MOUTH AT BEDTIME 30 capsule 5   cyclobenzaprine (FLEXERIL) 10 MG tablet Take 1 tablet (10 mg total) by mouth 3 (three) times daily as needed for muscle spasms. 60 tablet 5   glucose blood (ONE TOUCH ULTRA TEST) test strip Once a day 100 each 2   Lancets (ONETOUCH ULTRASOFT) lancets Once a day 100 each 2   Current Facility-Administered Medications  Medication Dose Route Frequency Provider Last Rate Last Admin   0.9 %  sodium chloride infusion  500 mL Intravenous Once Ladene Artist, MD        Allergies as of 09/04/2021   (No Known Allergies)    Family History  Problem Relation Age of Onset   Heart attack Maternal Grandfather 47       Died suddenly "family lore suggested an MI"   Colon cancer Neg Hx    Stomach cancer Neg Hx    Colon polyps Neg Hx    Esophageal cancer Neg Hx    Rectal cancer Neg Hx     Social History   Socioeconomic History   Marital status: Married    Spouse name: Not on file   Number of children: 0   Years of education: Not on file   Highest education level: Not on file  Occupational History   Occupation: Chief Financial Officer   Occupation: Engineer, structural    Comment: Retired  Tobacco Use   Smoking status: Never   Smokeless tobacco: Never  Scientific laboratory technician Use: Never used  Substance and Sexual Activity   Alcohol use: Not Currently    Alcohol/week: 0.0 - 1.0 standard drinks    Comment: once per month   Drug use: No   Sexual activity: Not on file  Other Topics Concern   Not on file  Social History Narrative   Lives at home with wife and four dogs.    Social Determinants of Health   Financial Resource Strain: Not on file  Food Insecurity: Not on file  Transportation Needs: Not on file  Physical Activity: Not on file  Stress: Not on file  Social Connections: Not on file  Intimate Partner Violence:  Not on file    Review of Systems:  All systems reviewed an negative except where noted in HPI.  Gen: Denies any fever, chills, sweats, anorexia, fatigue, weakness, malaise, weight loss, and sleep disorder CV: Denies chest pain, angina, palpitations, syncope, orthopnea, PND, peripheral edema, and claudication. Resp: Denies dyspnea at rest, dyspnea with exercise,  cough, sputum, wheezing, coughing up blood, and pleurisy. GI: Denies vomiting blood, jaundice, and fecal incontinence.   Denies dysphagia or odynophagia. GU : Denies urinary burning, blood in urine, urinary frequency, urinary hesitancy, nocturnal urination, and urinary incontinence. MS: Denies joint pain, limitation of movement, and swelling, stiffness, low back pain, extremity pain. Denies muscle weakness, cramps, atrophy.  Derm: Denies rash, itching, dry skin, hives, moles, warts, or unhealing ulcers.  Psych: Denies depression, anxiety, memory loss, suicidal ideation, hallucinations, paranoia, and confusion. Heme: Denies bruising, bleeding, and enlarged lymph nodes. Neuro:  Denies any headaches, dizziness, paresthesias. Endo:  Denies any problems with DM, thyroid, adrenal function.   Physical Exam: Vital signs in last 24 hours: General:  Alert, well-developed, in NAD Head:  Normocephalic and atraumatic. Eyes:  Sclera clear, no icterus.   Conjunctiva pink. Ears:  Normal auditory acuity. Mouth:  No deformity or lesions.  Neck:  Supple; no masses . Lungs:  Clear throughout to auscultation.   No wheezes, crackles, or rhonchi. No acute distress. Heart:  Regular rate and rhythm; no murmurs. Abdomen:  Soft, nondistended, nontender. No masses, hepatomegaly. No obvious masses.  Normal bowel .    Rectal:  Deferred to colonoscopy Msk:  Symmetrical without gross deformities.. Pulses:  Normal pulses noted. Extremities:  Without edema. Neurologic:  Alert and  oriented x4;  grossly normal neurologically. Skin:  Intact without  significant lesions or rashes. Cervical Nodes:  No significant cervical adenopathy. Psych:  Alert and cooperative. Normal mood and affect.   Impression / Plan:   Personal history of adenomatous colon polyps on last colonoscopy in 2015 here for surveillance colonoscopy.    Pricilla Riffle. Fuller Plan  09/04/2021, 10:12 AM See Shea Evans, [facility], to contact our on call provider

## 2021-09-04 NOTE — Op Note (Signed)
[facility] Patient Name: Justin Herrera Procedure Date: 09/04/2021 10:05 AM MRN: [id number] Endoscopist: Ladene Artist , MD Age: 71 Referring MD:  Date of Birth: Dec 28, 1950 Gender: Male Account #: [id number] Procedure:                Colonoscopy Indications:              Surveillance: Personal history of adenomatous                            polyps on last colonoscopy > 5 years ago Medicines:                Monitored Anesthesia Care Procedure:                Pre-Anesthesia Assessment:                           - Prior to the procedure, a History and Physical                            was performed, and patient medications and                            allergies were reviewed. The patient's tolerance of                            previous anesthesia was also reviewed. The risks                            and benefits of the procedure and the sedation                            options and risks were discussed with the patient.                            All questions were answered, and informed consent                            was obtained. Prior Anticoagulants: The patient has                            taken no previous anticoagulant or antiplatelet                            agents. ASA Grade Assessment: II - A patient with                            mild systemic disease. After reviewing the risks                            and benefits, the patient was deemed in                            satisfactory condition to undergo the procedure.  After obtaining informed consent, the colonoscope                            was passed under direct vision. Throughout the                            procedure, the patient's blood pressure, pulse, and                            oxygen saturations were monitored continuously. The                            Olympus CF-HQ190L [id number]) Colonoscope was                            introduced through the  anus and advanced to the the                            cecum, identified by appendiceal orifice and                            ileocecal valve. The ileocecal valve, appendiceal                            orifice, and rectum were photographed. The quality                            of the bowel preparation was excellent. The                            colonoscopy was performed without difficulty. The                            patient tolerated the procedure well. Scope In: 10:21:41 AM Scope Out: 10:36:02 AM Scope Withdrawal Time: 0 hours 10 minutes 45 seconds  Total Procedure Duration: 0 hours 14 minutes 21 seconds  Findings:                 The perianal and digital rectal examinations were                            normal.                           A 6 mm polyp was found in the transverse colon. The                            polyp was sessile. The polyp was removed with a                            cold snare. Resection and retrieval were complete.                           A few small-mouthed diverticula were found in the  left colon. There was no evidence of diverticular                            bleeding.                           Internal hemorrhoids were found during                            retroflexion. The hemorrhoids were small and Grade                            I (internal hemorrhoids that do not prolapse).                           The exam was otherwise without abnormality on                            direct and retroflexion views. Complications:            No immediate complications. Estimated blood loss:                            None. Estimated Blood Loss:     Estimated blood loss: none. Impression:               - One 6 mm polyp in the transverse colon, removed                            with a cold snare. Resected and retrieved.                           - Mild diverticulosis in the left colon.                           - Internal  hemorrhoids.                           - The examination was otherwise normal on direct                            and retroflexion views. Recommendation:           - Repeat colonoscopy after studies are complete for                            surveillance based on pathology results.                           - Patient has a contact number available for                            emergencies. The signs and symptoms of potential                            delayed complications were discussed with the  patient. Return to normal activities tomorrow.                            Written discharge instructions were provided to the                            patient.                           - High fiber diet.                           - Continue present medications.                           - Await pathology results. Ladene Artist, MD 09/04/2021 10:41:02 AM This report has been signed electronically.

## 2021-09-04 NOTE — Progress Notes (Signed)
Called to room to assist during endoscopic procedure.  Patient ID and intended procedure confirmed with present staff. Received instructions for my participation in the procedure from the performing physician.  

## 2021-09-06 ENCOUNTER — Telehealth: Payer: Self-pay

## 2021-09-06 NOTE — Telephone Encounter (Signed)
Left message on follow up call. 

## 2021-09-06 NOTE — Telephone Encounter (Signed)
Left message on 2nd follow up cal.

## 2021-09-07 ENCOUNTER — Other Ambulatory Visit: Payer: Self-pay | Admitting: Family Medicine

## 2021-09-12 ENCOUNTER — Ambulatory Visit: Payer: Medicare PPO | Attending: Family Medicine

## 2021-09-12 ENCOUNTER — Encounter: Payer: Self-pay | Admitting: Gastroenterology

## 2021-09-12 ENCOUNTER — Other Ambulatory Visit: Payer: Self-pay

## 2021-09-12 DIAGNOSIS — G8929 Other chronic pain: Secondary | ICD-10-CM | POA: Insufficient documentation

## 2021-09-12 DIAGNOSIS — M5441 Lumbago with sciatica, right side: Secondary | ICD-10-CM | POA: Insufficient documentation

## 2021-09-12 DIAGNOSIS — M79604 Pain in right leg: Secondary | ICD-10-CM | POA: Diagnosis not present

## 2021-09-12 DIAGNOSIS — M6281 Muscle weakness (generalized): Secondary | ICD-10-CM | POA: Diagnosis not present

## 2021-09-12 NOTE — Therapy (Signed)
[facility] @ [place], [place], [address] Phone: [phone]   Fax:  [phone]  Physical Therapy Evaluation  Patient Details  Name: Justin Herrera MRN: [id number] Date of Birth: 1951/02/24 Referring Provider (PT): Dr. Alysia Penna   Encounter Date: 09/12/2021   PT End of Session - 09/12/21 1641     Visit Number 1    Date for PT Re-Evaluation 11/07/21    Authorization Type [employer] (cohere)    Authorization - Visit Number 1    PT Start Time 1532    PT Stop Time 1618    PT Time Calculation (min) 46 min    Activity Tolerance Patient tolerated treatment well    Behavior During Therapy WFL for tasks assessed/performed             Past Medical History:  Diagnosis Date   Anxiety    on meds   Arthritis    generalized   Depression    on meds   Diabetes mellitus    Type II-on meds   GERD (gastroesophageal reflux disease)    Heart murmur    slight murmur noted per pt   Hypertension    on meds   Insomnia    Sleep apnea, obstructive    wears a CPAP   Symptomatic cholelithiasis     Past Surgical History:  Procedure Laterality Date   CHOLECYSTECTOMY N/A 03/28/2018   Procedure: LAPAROSCOPIC CHOLECYSTECTOMY WITH INTRAOPERATIVE CHOLANGIOGRAM;  Surgeon: [name], MD;  Location: [facility];  Service: General;  Laterality: N/A;   COLONOSCOPY  11/20/2013   per Dr. Fuller Plan, adenomatous polyps x 2, repeat in 5 yrs  (movi(prep good)   CYSTECTOMY Right 2014   right wrist   [place]      There were no vitals filed for this visit.    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  He explains that he has experienced some degree of back issues as far back as 71 years old.  He states that in the past year he had an incident where he was lifting something in a twisting position and felt  immediate burning into his right buttock and thigh.  He states that this eventually resolved but occasionally if he stands too long or bends a certain way, he will have some return of these symptoms.  Imaging is as follows:1. 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  2. Mild multilevel lower lumbar spondylolisthesis.  3. Mild bilateral lower lumbar facet arthropathy .  He enjoys working outdoors.  He hopes to be able to learn to manage his back pain, be able to stand as long as he wants and not have to lean on counters or on shopping cart at grocery store.  He would like to be able to do his usual work around the house without back pain or having to stop and rest.    Limitations Standing;Walking    How long can you stand comfortably? 10 min    How long can you walk comfortably? 10 min    Diagnostic tests Xrays: 1. 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  2. Mild multilevel lower lumbar spondylolisthesis.  3. Mild bilateral lower lumbar facet arthropathy    Patient Stated Goals He  hopes to be able to learn to manage his back pain, be able to stand as long as he wants and not have to lean on counters or on shopping cart at grocery store.  He would like to be able to do his usual work around the house without back pain or having to stop and rest    Currently in Pain? 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 Alysia Penna    Onset Date/Surgical Date 08/27/20    Hand Dominance Right    Next MD Visit prn    Prior Therapy no      Precautions   Precautions Back;Other (comment)   avoid lumbar extension due to spondylolisthesis     Restrictions   Weight Bearing Restrictions No      Balance Screen   Has the patient fallen in the past 6 months No    Has the patient had a decrease in activity level because of a fear of falling?  No    Is the patient reluctant to leave their home because of a fear of falling?  No      Home Environment   Living Environment Private residence    Living Arrangements Spouse/significant other    Type of [place] to enter    Entrance Stairs-Number of Steps 4    Entrance Stairs-Rails Right    [place] Two level    Alternate Level Stairs-Number of Steps 12    Alternate Level Stairs-Rails Can reach both      Prior Function   Level of Independence Independent    Vocation Retired    Leisure work around the home      Cognition   Overall Cognitive Status Within Functional Limits for tasks assessed      Observation/Other Assessments   Observations alert, oriented, no acute distress      Sensation   Light Touch Appears Intact      Functional Tests   Functional tests Sit to Stand      Sit to Stand   Comments able to come to stand without UE support      Posture/Postural Control   Posture/Postural Control Postural limitations    Postural Limitations Decreased lumbar lordosis;Posterior pelvic tilt      ROM / Strength   AROM / PROM / Strength AROM;Strength      AROM   AROM Assessment Site Lumbar    Lumbar Flexion WNL    Lumbar Extension 50% with leg pain    Lumbar - Right Side Bend fingertips to just above joint line with leg pain    Lumbar - Left Side Bend fingertips to just above joint line    Lumbar - Right Rotation WNL    Lumbar - Left Rotation WNL  Strength   Overall Strength Within functional limits for tasks  performed      Flexibility   Soft Tissue Assessment /Muscle Length yes    Hamstrings to approx 70 degrees on left and 60 degrees on right    Quadriceps Positive Thomas test      Ambulation/Gait   Ambulation/Gait Yes    Gait Pattern Within Functional Limits                        Objective measurements completed on examination: See above findings.                PT Education - 09/12/21 1638     Education Details Initiated HEP (Access Code: [email or web address]) and educated on anatomy of the lumbar spine and how tight hamstrings will affect low back and that his lumbar flexion mobility is almost excessive and this could place the low back at risk for continued degenerative changes.  Educated also on how core strength is crucial as well in protecting the spine.    Person(s) Educated Patient    Methods Explanation;Demonstration;Verbal cues;Handout    Comprehension Verbalized understanding;Returned demonstration;Verbal cues required              PT Short Term Goals - 09/12/21 1658       PT SHORT TERM GOAL #1   Title Independence with initial HEP    Time 4    Period Weeks    Status New    Target Date 10/10/21      PT SHORT TERM GOAL #2   Title Centralize radicular symptoms    Time 4    Period Weeks    Status New    Target Date 10/10/21               PT Long Term Goals - 09/12/21 1659       PT LONG TERM GOAL #1   Title Independence with advance HEP    Time 8    Period Weeks    Status New    Target Date 11/07/21      PT LONG TERM GOAL #2   Title Eliminate radicular symptoms    Time 8    Period Weeks    Status New    Target Date 11/07/21      PT LONG TERM GOAL #3   Title Patient to be able to stand upright for at least 30 min while doing activities in the kitchen or in grocery store and avoid having to lean fwd on counters or on shopping cart.    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  He has experienced some degree of back issues as far back as 71 years old.  He describes and incident in the past year where he was lifting something in a twisting position and felt immediate burning into his right buttock  and thigh.  Imaging is as follows:1. 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  2. Mild multilevel lower lumbar spondylolisthesis.  3. Mild bilateral lower lumbar facet arthropathy . He presents with limited lumbar side bending and excessive flexion but limited hamstring and hip flexor quad flexibility bilaterally.  He is neurologically intact as of today.  He likely has some radicular symptoms when nerve root compression occurs due to stenosis.  He enjoys working outdoors.  He hopes to be able to learn to manage his back pain, be able to stand as long as he wants and not have to lean on counters or on shopping cart at grocery store.  He would like to be able to do his usual work around the house without back pain or having to stop and rest.  He would benefit from LE flexibility exercises along with core strengthening and education on proper posture and body mechanics.    Personal Factors and Comorbidities Comorbidity 1    Comorbidities Htn    Examination-Activity Limitations Carry;Stand;Bend;Lift    Examination-Participation Restrictions Community Activity;Yard Work    Stability/Clinical Decision Making Stable/Uncomplicated    Designer, jewellery Low    Rehab Potential Good    PT Frequency 2x / week    PT Duration 8 weeks    PT Treatment/Interventions ADLs/Self Care Home Management;Aquatic Therapy;Traction;Moist  Heat;Electrical Stimulation;Cryotherapy;Ultrasound;Gait training;Therapeutic exercise;Therapeutic activities;Functional mobility training;Stair training;Balance training;Neuromuscular re-education;Patient/family education;Manual techniques;Passive range of motion;Dry needling;Spinal Manipulations    PT Next Visit Plan Review HEP, begin core strengthening, LE strengthening    PT Home Exercise Plan Access Code: [email or web address]    Consulted and Agree with Plan of Care Patient             Patient will benefit from skilled therapeutic intervention in order to improve the following deficits and impairments:  Decreased balance, Decreased mobility, Difficulty walking, Increased muscle spasms, Impaired sensation, Improper body mechanics, Decreased range of motion, Decreased strength, Impaired flexibility, Pain  Visit Diagnosis: Chronic right-sided low back pain with right-sided sciatica - Plan: PT plan of care cert/re-cert  Muscle weakness (generalized) - Plan: PT plan of care cert/re-cert  Pain in right leg - Plan: PT plan of care cert/re-cert     Problem List Patient Active Problem List   Diagnosis Date Noted   Chronic right-sided low back pain with right-sided sciatica 08/23/2021   Educated about COVID-19 virus infection 04/11/2020   Murmur 04/11/2020   Abnormal stress test 08/03/2015   Tenosynovitis, de Quervain 06/03/2014   Plantar fasciitis 06/03/2014   Type 2 diabetes mellitus without complication, without long-term current use of insulin ([place]) 04/05/2010   UNS ADVRS EFF OTH RX MEDICINAL&BIOLOGICAL SBSTNC 04/05/2010   INSOMNIA 03/09/2009   Anxiety state 03/30/2008   HYPERTENSION, BENIGN ESSENTIAL 02/18/2008   GERD 02/18/2008   Arthropathy 02/18/2008   OSA (obstructive sleep apnea) 02/18/2008    Anderson Malta B. Doyle Tegethoff, PT 01/17/235:05 PM   [facility] @ [place], [place], [address] Phone: [phone]   Fax:   [phone]  Name: Justin Herrera MRN: [id number] Date of Birth: May 10, 1951

## 2021-09-12 NOTE — Patient Instructions (Signed)
Initiated HEP Access Code: [email or web address] URL: [email or web address] Date: 09/12/2021 Prepared by: Candyce Churn  Exercises Standing Hamstring Stretch on Chair - 2 x daily - 7 x weekly - 1 sets - 3 reps - 30 sec hold Standing Quad Stretch with Table and Chair Support - 1 x daily - 7 x weekly - 1 sets - 3 reps - 30 sec hold Supine Posterior Pelvic Tilt - 1 x daily - 7 x weekly - 1 sets - 3 reps - 30 sec hold

## 2021-09-14 ENCOUNTER — Ambulatory Visit: Payer: Medicare PPO | Admitting: Physical Therapy

## 2021-09-14 ENCOUNTER — Other Ambulatory Visit: Payer: Self-pay

## 2021-09-14 DIAGNOSIS — M79604 Pain in right leg: Secondary | ICD-10-CM | POA: Diagnosis not present

## 2021-09-14 DIAGNOSIS — M6281 Muscle weakness (generalized): Secondary | ICD-10-CM

## 2021-09-14 DIAGNOSIS — G8929 Other chronic pain: Secondary | ICD-10-CM | POA: Diagnosis not present

## 2021-09-14 DIAGNOSIS — M5441 Lumbago with sciatica, right side: Secondary | ICD-10-CM | POA: Diagnosis not present

## 2021-09-14 NOTE — Therapy (Signed)
[facility] @ [place], [place], [address] Phone: [phone]   Fax:  [phone]  Physical Therapy Treatment  Patient Details  Name: Justin Herrera MRN: [id number] Date of Birth: August 09, 1951 Referring Provider (PT): Dr. Alysia Penna   Encounter Date: 09/14/2021   PT End of Session - 09/14/21 1624     Visit Number 2    Number of Visits 16    Date for PT Re-Evaluation 11/07/21    Authorization Type [employer] (cohere) 16 visits: until 3/14    Authorization - Visit Number 2    Authorization - Number of Visits 16    PT Start Time 8299    PT Stop Time 1530    PT Time Calculation (min) 45 min    Activity Tolerance Patient tolerated treatment well             Past Medical History:  Diagnosis Date   Anxiety    on meds   Arthritis    generalized   Depression    on meds   Diabetes mellitus    Type II-on meds   GERD (gastroesophageal reflux disease)    Heart murmur    slight murmur noted per pt   Hypertension    on meds   Insomnia    Sleep apnea, obstructive    wears a CPAP   Symptomatic cholelithiasis     Past Surgical History:  Procedure Laterality Date   CHOLECYSTECTOMY N/A 03/28/2018   Procedure: LAPAROSCOPIC CHOLECYSTECTOMY WITH INTRAOPERATIVE CHOLANGIOGRAM;  Surgeon: [name], MD;  Location: [facility];  Service: General;  Laterality: N/A;   COLONOSCOPY  11/20/2013   per Dr. Fuller Plan, adenomatous polyps x 2, repeat in 5 yrs  (movi(prep good)   CYSTECTOMY Right 2014   right wrist   [place]      There were no vitals filed for this visit.   Subjective Assessment - 09/14/21 1447     Subjective About the same.  I thought the exercises were helping but his morning it was back.   Night is good but going vertical is painful.  Walking the dogs bothers me.  OK sitting but worse with rising.    Pertinent History left and right knee history     Diagnostic tests Xrays: 1. 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  2. Mild multilevel lower lumbar spondylolisthesis.  3. Mild bilateral lower lumbar facet arthropathy    Patient Stated Goals He hopes to be able to learn to manage his back pain, be able to stand as long as he wants and not have to lean on counters or on shopping cart at grocery store.  He would like to be able to do his usual work around the house without back pain or having to stop and rest    Currently in Pain? Yes    Pain Score 2     Pain Location Back    Pain Orientation Right                               [facility] Adult PT Treatment/Exercise - 09/14/21 0001       Lumbar Exercises: Stretches   Active Hamstring Stretch Right;Left;5 reps    Active Hamstring Stretch Limitations on 2nd step    Other Lumbar Stretch Exercise hip flexor stretch with pelvic  tilt 5x right/left      Lumbar Exercises: Seated   Other Seated Lumbar Exercises transversus abdominus activation    Other Seated Lumbar Exercises discussed using abdominal brace with transitional movements      Lumbar Exercises: Supine   Ab Set 10 reps;5 seconds    Bent Knee Raise 10 reps    Isometric Hip Flexion 5 reps                       PT Short Term Goals - 09/12/21 1658       PT SHORT TERM GOAL #1   Title Independence with initial HEP    Time 4    Period Weeks    Status New    Target Date 10/10/21      PT SHORT TERM GOAL #2   Title Centralize radicular symptoms    Time 4    Period Weeks    Status New    Target Date 10/10/21               PT Long Term Goals - 09/12/21 1659       PT LONG TERM GOAL #1   Title Independence with advance HEP    Time 8    Period Weeks    Status New    Target Date 11/07/21      PT LONG TERM GOAL #2   Title Eliminate radicular symptoms    Time 8    Period Weeks    Status New    Target Date 11/07/21      PT LONG  TERM GOAL #3   Title Patient to be able to stand upright for at least 30 min while doing activities in the kitchen or in grocery store and avoid having to lean fwd on counters or on shopping cart.    Time 8    Period Weeks    Status New    Target Date 11/07/21      PT LONG TERM GOAL #4   Title Patient to be able to walk 30 min for exercise to help with managing his low back pain    Time 8    Period Weeks    Status New    Target Date 11/07/21                   Plan - 09/14/21 1624     Clinical Impression Statement Initiated transverse abdominus activation ex's in supine and sitting.  Cues to avoid holding breath and coordination of breathing for best activation.  No back or thigh pain with progression to bent knee lifts and lowers.  Discussed using abdominal muscle activation with transitional movements (supine to standing in the AM).  Review of initial HEP with alternate methods provided to do both stretches on steps.  Therapist monitoring response with all interventions.    Personal Factors and Comorbidities Comorbidity 1    Examination-Activity Limitations Carry;Stand;Bend;Lift    Stability/Clinical Decision Making Stable/Uncomplicated    Rehab Potential Good    PT Frequency 2x / week    PT Duration 8 weeks    PT Treatment/Interventions ADLs/Self Care Home Management;Aquatic Therapy;Traction;Moist Heat;Electrical Stimulation;Cryotherapy;Ultrasound;Gait training;Therapeutic exercise;Therapeutic activities;Functional mobility training;Stair training;Balance training;Neuromuscular re-education;Patient/family education;Manual techniques;Passive range of motion;Dry needling;Spinal Manipulations    PT Next Visit Plan progress supine core ex's;  try quadruped on mat (padding for knees) bird dogs; planks or quadruped hovers;  standing core ex's: [place], lat bar, resisted backward walk/forward walk;  seated neural floss  PT Home Exercise Plan Access Code: [email or web address]              Patient will benefit from skilled therapeutic intervention in order to improve the following deficits and impairments:  Decreased balance, Decreased mobility, Difficulty walking, Increased muscle spasms, Impaired sensation, Improper body mechanics, Decreased range of motion, Decreased strength, Impaired flexibility, Pain  Visit Diagnosis: Chronic right-sided low back pain with right-sided sciatica  Muscle weakness (generalized)  Pain in right leg     Problem List Patient Active Problem List   Diagnosis Date Noted   Chronic right-sided low back pain with right-sided sciatica 08/23/2021   Educated about COVID-19 virus infection 04/11/2020   Murmur 04/11/2020   Abnormal stress test 08/03/2015   Tenosynovitis, de Quervain 06/03/2014   Plantar fasciitis 06/03/2014   Type 2 diabetes mellitus without complication, without long-term current use of insulin ([place]) 04/05/2010   UNS ADVRS EFF OTH RX MEDICINAL&BIOLOGICAL SBSTNC 04/05/2010   INSOMNIA 03/09/2009   Anxiety state 03/30/2008   HYPERTENSION, BENIGN ESSENTIAL 02/18/2008   GERD 02/18/2008   Arthropathy 02/18/2008   OSA (obstructive sleep apnea) 02/18/2008   Ruben Im, PT 09/14/21 4:36 PM Phone: [phone] Fax: [phone]  Alvera Singh, PT 09/14/2021, 4:36 PM  [facility] @ [place], [place], [address] Phone: [phone]   Fax:  [phone]  Name: Justin Herrera MRN: [id number] Date of Birth: 14-Oct-1950

## 2021-09-28 ENCOUNTER — Other Ambulatory Visit: Payer: Self-pay

## 2021-09-28 ENCOUNTER — Ambulatory Visit: Payer: Medicare PPO | Attending: Family Medicine | Admitting: Physical Therapy

## 2021-09-28 DIAGNOSIS — M79604 Pain in right leg: Secondary | ICD-10-CM | POA: Insufficient documentation

## 2021-09-28 DIAGNOSIS — M6281 Muscle weakness (generalized): Secondary | ICD-10-CM | POA: Diagnosis not present

## 2021-09-28 DIAGNOSIS — M5441 Lumbago with sciatica, right side: Secondary | ICD-10-CM | POA: Insufficient documentation

## 2021-09-28 DIAGNOSIS — G8929 Other chronic pain: Secondary | ICD-10-CM | POA: Insufficient documentation

## 2021-09-28 NOTE — Therapy (Signed)
[facility] @ [facility] [address] [place], [place], [address] Phone: [phone]   Fax:  [phone]  Physical Therapy Treatment  Patient Details  Name: Justin Herrera MRN: [id number] Date of Birth: 02-Jun-1951 Referring Provider (PT): Dr. Alysia Penna   Encounter Date: 09/28/2021   PT End of Session - 09/28/21 1527     Visit Number 3    Number of Visits 16    Date for PT Re-Evaluation 11/07/21    Authorization Type [employer] (cohere) 16 visits: until 3/14    Authorization - Visit Number 3    Authorization - Number of Visits 16    PT Start Time 4196    PT Stop Time 1525    PT Time Calculation (min) 40 min    Activity Tolerance Patient tolerated treatment well             Past Medical History:  Diagnosis Date   Anxiety    on meds   Arthritis    generalized   Depression    on meds   Diabetes mellitus    Type II-on meds   GERD (gastroesophageal reflux disease)    Heart murmur    slight murmur noted per pt   Hypertension    on meds   Insomnia    Sleep apnea, obstructive    wears a CPAP   Symptomatic cholelithiasis     Past Surgical History:  Procedure Laterality Date   CHOLECYSTECTOMY N/A 03/28/2018   Procedure: LAPAROSCOPIC CHOLECYSTECTOMY WITH INTRAOPERATIVE CHOLANGIOGRAM;  Surgeon: [name], MD;  Location: [facility];  Service: General;  Laterality: N/A;   COLONOSCOPY  11/20/2013   per Dr. Fuller Plan, adenomatous polyps x 2, repeat in 5 yrs  (movi(prep good)   CYSTECTOMY Right 2014   right wrist   [place]      There were no vitals filed for this visit.   Subjective Assessment - 09/28/21 1445     Subjective Feeling OK.  I think the ex's are mitigating things so anti-inflammatories can work.  Doing better with going vertical from sit to stand and out of bed, and bending over.    Diagnostic tests Xrays: 1. 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  2. Mild multilevel lower lumbar spondylolisthesis.  3. Mild bilateral lower lumbar facet arthropathy    Patient Stated Goals He hopes to be able to learn to manage his back pain, be able to stand as long as he wants and not have to lean on counters or on shopping cart at grocery store.  He would like to be able to do his usual work around the house without back pain or having to stop and rest    Currently in Pain? Yes    Pain Score 2     Pain Location Back    Pain Orientation Right    Pain Radiating Towards right buttock and thigh L1-L2                               [facility] Adult PT Treatment/Exercise - 09/28/21 0001       Lumbar Exercises: Stretches   Other Lumbar Stretch Exercise quadruped rocking 20x      Lumbar Exercises: Standing   Other Standing Lumbar Exercises elongation of right side with UE elevated, right hip extended with exhale 8x  Other Standing Lumbar Exercises decompression strategies with counter, overhead bar      Lumbar Exercises: Supine   Other Supine Lumbar Exercises long leg extension with exhalation 5 sec hold 6x right/left      Manual Therapy   Joint Mobilization right long leg axis pull 3 min; right inferior hip mob with belt 30 sec 3 min;  belt behind knees lumbar traction 3-4 min;  flexion rotation neutral gapping in sidelying grade 3/4 with each                       PT Short Term Goals - 09/12/21 1658       PT SHORT TERM GOAL #1   Title [place] with initial HEP    Time 4    Period Weeks    Status New    Target Date 10/10/21      PT SHORT TERM GOAL #2   Title Centralize radicular symptoms    Time 4    Period Weeks    Status New    Target Date 10/10/21               PT Long Term Goals - 09/12/21 1659       PT LONG TERM GOAL #1   Title [place] with advance HEP    Time 8    Period Weeks    Status New    Target Date 11/07/21      PT LONG  TERM GOAL #2   Title Eliminate radicular symptoms    Time 8    Period Weeks    Status New    Target Date 11/07/21      PT LONG TERM GOAL #3   Title Patient to be able to stand upright for at least 30 min while doing activities in the kitchen or in grocery store and avoid having to lean fwd on counters or on shopping cart.    Time 8    Period Weeks    Status New    Target Date 11/07/21      PT LONG TERM GOAL #4   Title Patient to be able to walk 30 min for exercise to help with managing his low back pain    Time 8    Period Weeks    Status New    Target Date 11/07/21                   Plan - 09/28/21 1533     Clinical Impression Statement Treatment focus today on lengthening right side, gapping and decompression strategies.  He has a positive response to hip and lumbar traction and may benefit from mechanical lumbar traction.   Discussed home self traction in multiple positions.   Therapist monitoring response throughout treatment session and modifying accordingly.    Personal Factors and Comorbidities Comorbidity 1    Examination-Activity Limitations Carry;Stand;Bend;Lift    Rehab Potential Good    PT Frequency 2x / week    PT Duration 8 weeks    PT Treatment/Interventions ADLs/Self Care Home Management;Aquatic Therapy;Traction;Moist Heat;Electrical Stimulation;Cryotherapy;Ultrasound;Gait training;Therapeutic exercise;Therapeutic activities;Functional mobility training;Stair training;Balance training;Neuromuscular re-education;Patient/family education;Manual techniques;Passive range of motion;Dry needling;Spinal Manipulations    PT Next Visit Plan try mechanical lumbar traction next visit    PT Home Exercise Plan Access Code: [email or web address]             Patient will benefit from skilled therapeutic intervention in order to improve the following deficits and impairments:  Decreased balance, Decreased  mobility, Difficulty walking, Increased muscle spasms, Impaired sensation,  Improper body mechanics, Decreased range of motion, Decreased strength, Impaired flexibility, Pain  Visit Diagnosis: Chronic right-sided low back pain with right-sided sciatica  Muscle weakness (generalized)  Pain in right leg     Problem List Patient Active Problem List   Diagnosis Date Noted   Chronic right-sided low back pain with right-sided sciatica 08/23/2021   Educated about COVID-19 virus infection 04/11/2020   Murmur 04/11/2020   Abnormal stress test 08/03/2015   Tenosynovitis, de Quervain 06/03/2014   Plantar fasciitis 06/03/2014   Type 2 diabetes mellitus without complication, without long-term current use of insulin ([place]) 04/05/2010   UNS ADVRS EFF OTH RX MEDICINAL&BIOLOGICAL SBSTNC 04/05/2010   INSOMNIA 03/09/2009   Anxiety state 03/30/2008   HYPERTENSION, BENIGN ESSENTIAL 02/18/2008   GERD 02/18/2008   Arthropathy 02/18/2008   OSA (obstructive sleep apnea) 02/18/2008   Ruben Im, PT 09/28/21 3:38 PM Phone: [phone] Fax: [phone]  Alvera Singh, PT 09/28/2021, 3:37 PM  [facility] @ [place], [place], [address] Phone: [phone]   Fax:  [phone]  Name: Justin Herrera MRN: [id number] Date of Birth: 03/17/1951

## 2021-10-03 ENCOUNTER — Ambulatory Visit: Payer: Medicare PPO | Admitting: Physical Therapy

## 2021-10-03 ENCOUNTER — Other Ambulatory Visit: Payer: Self-pay

## 2021-10-03 DIAGNOSIS — M5441 Lumbago with sciatica, right side: Secondary | ICD-10-CM

## 2021-10-03 DIAGNOSIS — G8929 Other chronic pain: Secondary | ICD-10-CM

## 2021-10-03 DIAGNOSIS — M6281 Muscle weakness (generalized): Secondary | ICD-10-CM | POA: Diagnosis not present

## 2021-10-03 DIAGNOSIS — M79604 Pain in right leg: Secondary | ICD-10-CM

## 2021-10-03 NOTE — Patient Instructions (Signed)
Access Code: [email or web address] URL: [email or web address] Date: 10/03/2021 Prepared by: Ruben Im  Exercises Standing Hamstring Stretch on Chair - 2 x daily - 7 x weekly - 1 sets - 3 reps - 30 sec hold Standing Quad Stretch with Table and Chair Support - 1 x daily - 7 x weekly - 1 sets - 3 reps - 30 sec hold Supine Posterior Pelvic Tilt - 1 x daily - 7 x weekly - 1 sets - 3 reps - 30 sec hold Supine Transversus Abdominis Bracing - Hands on Stomach - 1 x daily - 7 x weekly - 1 sets - 5 reps - 5 hold Hooklying Isometric Hip Flexion - 1 x daily - 7 x weekly - 1 sets - 5 reps - 5 hold Supine 90/90 Abdominal Bracing - 1 x daily - 7 x weekly - 1 sets - 10 reps Seated Transversus Abdominis Bracing with PLB - 1 x daily - 7 x weekly - 1 sets - 10 reps Standing Transverse Abdominis Contraction - 1 x daily - 7 x weekly - 1 sets - 10 reps Hip Flexor Stretch on Step - 1 x daily - 7 x weekly - 1 sets - 10 reps Self Traction in Standing with Counter Top - 1 x daily - 7 x weekly - 1 sets - 10 reps - 10 hold

## 2021-10-03 NOTE — Therapy (Signed)
[facility] @ [address] [place], [place], [address] Phone: [phone]   Fax:  [phone]  Physical Therapy Treatment  Patient Details  Name: Justin Herrera MRN: [id number] Date of Birth: [birth year] Referring Provider (PT): Dr. Alysia Penna   Encounter Date: 10/03/2021   PT End of Session - 10/03/21 1526     Visit Number 4    Number of Visits 16    Date for PT Re-Evaluation 11/07/21    Authorization Type [employer] (cohere) 16 visits: until 3/14    Authorization - Visit Number 4    Authorization - Number of Visits 16    PT Start Time 1324    PT Stop Time 1603    PT Time Calculation (min) 36 min    Activity Tolerance Patient tolerated treatment well             Past Medical History:  Diagnosis Date   Anxiety    on meds   Arthritis    generalized   Depression    on meds   Diabetes mellitus    Type II-on meds   GERD (gastroesophageal reflux disease)    Heart murmur    slight murmur noted per pt   Hypertension    on meds   Insomnia    Sleep apnea, obstructive    wears a CPAP   Symptomatic cholelithiasis     Past Surgical History:  Procedure Laterality Date   CHOLECYSTECTOMY N/A 03/28/2018   Procedure: LAPAROSCOPIC CHOLECYSTECTOMY WITH INTRAOPERATIVE CHOLANGIOGRAM;  Surgeon: [name], MD;  Location: [facility];  Service: General;  Laterality: N/A;   COLONOSCOPY  11/20/2013   per Dr. Fuller Plan, adenomatous polyps x 2, repeat in 5 yrs  (movi(prep good)   CYSTECTOMY Right 2014   right wrist   [place]      There were no vitals filed for this visit.   Subjective Assessment - 10/03/21 1527     Subjective Pretty well.  Still get twinges but better than when I started PT.  Soreness in thigh and buttock constantly.    Diagnostic tests Xrays: 1. 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  2. Mild  multilevel lower lumbar spondylolisthesis.  3. Mild bilateral lower lumbar facet arthropathy    Patient Stated Goals He hopes to be able to learn to manage his back pain, be able to stand as long as he wants and not have to lean on counters or on shopping cart at grocery store.  He would like to be able to do his usual work around the house without back pain or having to stop and rest    Currently in Pain? Yes    Pain Score 3     Pain Location Buttocks    Pain Orientation Right    Pain Type Chronic pain    Pain Radiating Towards right buttock and thigh                               [facility] Adult PT Treatment/Exercise - 10/03/21 0001       Lumbar Exercises: Standing   Other Standing Lumbar Exercises elongation of right side with UE elevated, right hip extended with exhale 8x; "little bits often" every 2 hours 2 min; using doorway   Other Standing Lumbar Exercises traction on counter  Traction   Type of Traction Lumbar    Min (lbs) 50    Max (lbs) 100    Hold Time 30    Rest Time 10    Time 15                     PT Education - 10/03/21 1552     Education Details self traction    Person(s) Educated Patient    Methods Explanation;Demonstration;Handout    Comprehension Verbalized understanding;Returned demonstration              PT Short Term Goals - 09/12/21 1658       PT SHORT TERM GOAL #1   Title Independence with initial HEP    Time 4    Period Weeks    Status New    Target Date 10/10/21      PT SHORT TERM GOAL #2   Title Centralize radicular symptoms    Time 4    Period Weeks    Status New    Target Date 10/10/21               PT Long Term Goals - 09/12/21 1659       PT LONG TERM GOAL #1   Title Independence with advance HEP    Time 8    Period Weeks    Status New    Target Date 11/07/21      PT LONG TERM GOAL #2   Title Eliminate radicular symptoms    Time 8    Period Weeks    Status New    Target Date  11/07/21      PT LONG TERM GOAL #3   Title Patient to be able to stand upright for at least 30 min while doing activities in the kitchen or in grocery store and avoid having to lean fwd on counters or on shopping cart.    Time 8    Period Weeks    Status New    Target Date 11/07/21      PT LONG TERM GOAL #4   Title Patient to be able to walk 30 min for exercise to help with managing his low back pain    Time 8    Period Weeks    Status New    Target Date 11/07/21                   Plan - 10/03/21 1604     Clinical Impression Statement The patient reports overall decreasing symptom intensity but buttock and anterior thigh pain remain constant.  Initiated mechanical lumbar traction intermittent and encouraged frequent "decompression" and "Opening/gapping" strategies to compliment traction trial.  No immediate changes following treatment session.    Examination-Participation Restrictions Community Activity;Yard Work    Rehab Potential Good    PT Frequency 2x / week    PT Duration 8 weeks    PT Treatment/Interventions ADLs/Self Care Home Management;Aquatic Therapy;Traction;Moist Heat;Electrical Stimulation;Cryotherapy;Ultrasound;Gait training;Therapeutic exercise;Therapeutic activities;Functional mobility training;Stair training;Balance training;Neuromuscular re-education;Patient/family education;Manual techniques;Passive range of motion;Dry needling;Spinal Manipulations    PT Next Visit Plan assess response to lumbar traction and increase pull as needed    PT Home Exercise Plan Access Code: [email or web address]             Patient will benefit from skilled therapeutic intervention in order to improve the following deficits and impairments:  Decreased balance, Decreased mobility, Difficulty walking, Increased muscle spasms, Impaired sensation, Improper body mechanics, Decreased range of motion, Decreased  strength, Impaired flexibility, Pain  Visit Diagnosis: Chronic right-sided low  back pain with right-sided sciatica  Muscle weakness (generalized)  Pain in right leg     Problem List Patient Active Problem List   Diagnosis Date Noted   Chronic right-sided low back pain with right-sided sciatica 08/23/2021   Educated about COVID-19 virus infection 04/11/2020   Murmur 04/11/2020   Abnormal stress test 08/03/2015   Tenosynovitis, de Quervain 06/03/2014   Plantar fasciitis 06/03/2014   Type 2 diabetes mellitus without complication, without long-term current use of insulin ([place]) 04/05/2010   UNS ADVRS EFF OTH RX MEDICINAL&BIOLOGICAL SBSTNC 04/05/2010   INSOMNIA 03/09/2009   Anxiety state 03/30/2008   HYPERTENSION, BENIGN ESSENTIAL 02/18/2008   GERD 02/18/2008   Arthropathy 02/18/2008   OSA (obstructive sleep apnea) 02/18/2008   Ruben Im, PT 10/03/21 4:10 PM Phone: [phone] Fax: [phone]  Alvera Singh, PT 10/03/2021, 4:09 PM  [facility] @ [place], [place], [address] Phone: [phone]   Fax:  [phone]  Name: Justin Herrera MRN: [id number] Date of Birth: September 21, 1950

## 2021-10-05 ENCOUNTER — Ambulatory Visit: Payer: Medicare PPO | Admitting: Physical Therapy

## 2021-10-05 ENCOUNTER — Other Ambulatory Visit: Payer: Self-pay

## 2021-10-05 DIAGNOSIS — M5441 Lumbago with sciatica, right side: Secondary | ICD-10-CM | POA: Diagnosis not present

## 2021-10-05 DIAGNOSIS — G8929 Other chronic pain: Secondary | ICD-10-CM

## 2021-10-05 DIAGNOSIS — M6281 Muscle weakness (generalized): Secondary | ICD-10-CM | POA: Diagnosis not present

## 2021-10-05 DIAGNOSIS — M79604 Pain in right leg: Secondary | ICD-10-CM

## 2021-10-05 NOTE — Therapy (Signed)
[facility] @ [place] [place], [place], [address] Phone: [phone]   Fax:  [phone]  Physical Therapy Treatment  Patient Details  Name: Justin Herrera MRN: [id number] Date of Birth: 12/05/1950 Referring Provider (PT): Dr. Alysia Penna   Encounter Date: 10/05/2021   PT End of Session - 10/05/21 1611     Visit Number 5    Number of Visits 16    Date for PT Re-Evaluation 11/07/21    Authorization Type [employer] (cohere) 16 visits: until 3/14    Authorization - Visit Number 5    Authorization - Number of Visits 16    PT Start Time 4650    PT Stop Time 1523    PT Time Calculation (min) 36 min    Activity Tolerance Patient tolerated treatment well             Past Medical History:  Diagnosis Date   Anxiety    on meds   Arthritis    generalized   Depression    on meds   Diabetes mellitus    Type II-on meds   GERD (gastroesophageal reflux disease)    Heart murmur    slight murmur noted per pt   Hypertension    on meds   Insomnia    Sleep apnea, obstructive    wears a CPAP   Symptomatic cholelithiasis     Past Surgical History:  Procedure Laterality Date   CHOLECYSTECTOMY N/A 03/28/2018   Procedure: LAPAROSCOPIC CHOLECYSTECTOMY WITH INTRAOPERATIVE CHOLANGIOGRAM;  Surgeon: [name], MD;  Location: [facility];  Service: General;  Laterality: N/A;   COLONOSCOPY  11/20/2013   per Dr. Fuller Plan, adenomatous polyps x 2, repeat in 5 yrs  (movi(prep good)   CYSTECTOMY Right 2014   right wrist   [place]      There were no vitals filed for this visit.   Subjective Assessment - 10/05/21 1504     Subjective This is the best I've felt in a long time.  The traction felt very productive.  Thigh symptoms have also lessened.    Patient Stated Goals He hopes to be able to learn to manage his back pain, be able to stand as long as he wants and not have to lean on  counters or on shopping cart at grocery store.  He would like to be able to do his usual work around the house without back pain or having to stop and rest    Currently in Pain? Yes    Pain Score 2     Pain Location Back    Pain Orientation Right    Pain Radiating Towards right buttock and thigh                               [facility] Adult PT Treatment/Exercise - 10/05/21 0001       Lumbar Exercises: Standing   Other Standing Lumbar Exercises encouraged frequent self traction followed by walking for "pumping" mechanism to promote healing      Lumbar Exercises: Sidelying   Other Sidelying Lumbar Exercises left sidelying over folded pillow for right side lateral gapping      Traction   Type of Traction Lumbar    Min (lbs) 70    Max (lbs) 115    Hold Time 30    Rest Time 10    Time  15                       PT Short Term Goals - 09/12/21 1658       PT SHORT TERM GOAL #1   Title [facility] with initial HEP    Time 4    Period Weeks    Status New    Target Date 10/10/21      PT SHORT TERM GOAL #2   Title Centralize radicular symptoms    Time 4    Period Weeks    Status New    Target Date 10/10/21               PT Long Term Goals - 09/12/21 1659       PT LONG TERM GOAL #1   Title [facility] with advance HEP    Time 8    Period Weeks    Status New    Target Date 11/07/21      PT LONG TERM GOAL #2   Title Eliminate radicular symptoms    Time 8    Period Weeks    Status New    Target Date 11/07/21      PT LONG TERM GOAL #3   Title Patient to be able to stand upright for at least 30 min while doing activities in the kitchen or in grocery store and avoid having to lean fwd on counters or on shopping cart.    Time 8    Period Weeks    Status New    Target Date 11/07/21      PT LONG TERM GOAL #4   Title Patient to be able to walk 30 min for exercise to help with managing his low back pain    Time 8    Period Weeks     Status New    Target Date 11/07/21                   Plan - 10/05/21 1711     Clinical Impression Statement The patient had a very positive response to mechanical traction last visit with decreased back, buttock and thigh pain the last 2 days.  Continued with intermittent traction at a slightly higher pull intensity.  Encouraged frequent self traction following by walking at home to create "pumping" mechanism to further promote healing.  Also discussed lateral gapping in sidelying for further centralization.    Examination-Activity Limitations Carry;Stand;Bend;Lift    Examination-Participation Restrictions Community Activity;Yard Work    Rehab Potential Good    PT Frequency 2x / week    PT Duration 8 weeks    PT Treatment/Interventions ADLs/Self Care Home Management;Aquatic Therapy;Traction;Moist Heat;Electrical Stimulation;Cryotherapy;Ultrasound;Gait training;Therapeutic exercise;Therapeutic activities;Functional mobility training;Stair training;Balance training;Neuromuscular re-education;Patient/family education;Manual techniques;Passive range of motion;Dry needling;Spinal Manipulations    PT Next Visit Plan mechanical traction lumbar intermittent; assess response to lateral component gapping    PT Home Exercise Plan Access Code: [email or web address]             Patient will benefit from skilled therapeutic intervention in order to improve the following deficits and impairments:  Decreased balance, Decreased mobility, Difficulty walking, Increased muscle spasms, Impaired sensation, Improper body mechanics, Decreased range of motion, Decreased strength, Impaired flexibility, Pain  Visit Diagnosis: Chronic right-sided low back pain with right-sided sciatica  Muscle weakness (generalized)  Pain in right leg     Problem List Patient Active Problem List   Diagnosis Date Noted   Chronic right-sided low back pain with right-sided  sciatica 08/23/2021   Educated about COVID-19 virus  infection 04/11/2020   Murmur 04/11/2020   Abnormal stress test 08/03/2015   Tenosynovitis, de Quervain 06/03/2014   Plantar fasciitis 06/03/2014   Type 2 diabetes mellitus without complication, without long-term current use of insulin ([place]) 04/05/2010   UNS ADVRS EFF OTH RX MEDICINAL&BIOLOGICAL SBSTNC 04/05/2010   INSOMNIA 03/09/2009   Anxiety state 03/30/2008   HYPERTENSION, BENIGN ESSENTIAL 02/18/2008   GERD 02/18/2008   Arthropathy 02/18/2008   OSA (obstructive sleep apnea) 02/18/2008   Ruben Im, PT 10/05/21 5:17 PM Phone: [phone] Fax: [phone]  Alvera Singh, PT 10/05/2021, 5:16 PM  [facility] @ [place], [place], [address] Phone: [phone]   Fax:  [phone]  Name: JOHNATAN BASKETTE MRN: [id number] Date of Birth: 1951-02-25

## 2021-10-10 ENCOUNTER — Other Ambulatory Visit: Payer: Self-pay

## 2021-10-10 ENCOUNTER — Ambulatory Visit: Payer: Medicare PPO | Admitting: Physical Therapy

## 2021-10-10 DIAGNOSIS — G8929 Other chronic pain: Secondary | ICD-10-CM

## 2021-10-10 DIAGNOSIS — M6281 Muscle weakness (generalized): Secondary | ICD-10-CM

## 2021-10-10 DIAGNOSIS — M79604 Pain in right leg: Secondary | ICD-10-CM | POA: Diagnosis not present

## 2021-10-10 DIAGNOSIS — M5441 Lumbago with sciatica, right side: Secondary | ICD-10-CM | POA: Diagnosis not present

## 2021-10-10 NOTE — Therapy (Signed)
[facility] @ [facility] [place], [place], [address] Phone: [phone]   Fax:  [phone]  Physical Therapy Treatment  Patient Details  Name: Justin Herrera MRN: [id number] Date of Birth: 06-29-1951 Referring Provider (PT): Dr. Alysia Penna   Encounter Date: 10/10/2021   PT End of Session - 10/10/21 1553     Visit Number 6    Number of Visits 16    Date for PT Re-Evaluation 11/07/21    Authorization Type [employer] (cohere) 16 visits: until 3/14    Authorization - Visit Number 6    Authorization - Number of Visits 16    PT Start Time 9833    PT Stop Time 1600    PT Time Calculation (min) 30 min    Activity Tolerance Patient tolerated treatment well             Past Medical History:  Diagnosis Date   Anxiety    on meds   Arthritis    generalized   Depression    on meds   Diabetes mellitus    Type II-on meds   GERD (gastroesophageal reflux disease)    Heart murmur    slight murmur noted per pt   Hypertension    on meds   Insomnia    Sleep apnea, obstructive    wears a CPAP   Symptomatic cholelithiasis     Past Surgical History:  Procedure Laterality Date   CHOLECYSTECTOMY N/A 03/28/2018   Procedure: LAPAROSCOPIC CHOLECYSTECTOMY WITH INTRAOPERATIVE CHOLANGIOGRAM;  Surgeon: [name], MD;  Location: [facility];  Service: General;  Laterality: N/A;   COLONOSCOPY  11/20/2013   per Dr. Fuller Plan, adenomatous polyps x 2, repeat in 5 yrs  (movi(prep good)   CYSTECTOMY Right 2014   right wrist   [place]      There were no vitals filed for this visit.   Subjective Assessment - 10/10/21 1546     Subjective The traction helps a lot.  Thigh symptoms are intemittent and less intense.  I don't feel it there right now.  Some twinges in low back at times.    Pertinent History left and right knee history    Diagnostic tests Xrays: 1. Moderate to marked  multilevel lumbar degenerative disc disease,  most prominent at L3-4 and L4-5.  2. Mild multilevel lower lumbar spondylolisthesis.  3. Mild bilateral lower lumbar facet arthropathy    Currently in Pain? Yes    Pain Score 1     Pain Location Back    Pain Orientation Right    Pain Type Chronic pain    Pain Radiating Towards right buttock and thigh                               [facility] Adult PT Treatment/Exercise - 10/10/21 0001       Self-Care   Self-Care Other Self-Care Comments    Other Self-Care Comments  home traction options      Traction   Type of Traction Lumbar    Min (lbs) 75    Max (lbs) 125    Hold Time 30    Rest Time 10    Time 15                     PT Education - 10/10/21 1555     Education Details home  traction options    Person(s) Educated Patient    Methods Explanation;Handout    Comprehension Returned demonstration;Verbalized understanding              PT Short Term Goals - 10/10/21 1623       PT SHORT TERM GOAL #1   Title [facility] with initial HEP    Status Achieved      PT SHORT TERM GOAL #2   Title Centralize radicular symptoms    Status Partially Met               PT Long Term Goals - 09/12/21 1659       PT LONG TERM GOAL #1   Title [facility] with advance HEP    Time 8    Period Weeks    Status New    Target Date 11/07/21      PT LONG TERM GOAL #2   Title Eliminate radicular symptoms    Time 8    Period Weeks    Status New    Target Date 11/07/21      PT LONG TERM GOAL #3   Title Patient to be able to stand upright for at least 30 min while doing activities in the kitchen or in grocery store and avoid having to lean fwd on counters or on shopping cart.    Time 8    Period Weeks    Status New    Target Date 11/07/21      PT LONG TERM GOAL #4   Title Patient to be able to walk 30 min for exercise to help with managing his low back pain    Time 8    Period Weeks    Status New     Target Date 11/07/21                   Plan - 10/10/21 1618     Clinical Impression Statement The patient reports continued improvements in pain intensity and more intermittent vs. constant thigh symptoms.  He feels mechanical traction continues to be the most beneficial intervention.  He continues to perform decompression strategies at home as well.  Discussed home traction devices and pros/cons of other medical interventions particularly injections.  Therapist monitoring response during treatment session.  Good tolerance to increase intensity on traction.    Examination-Activity Limitations Carry;Stand;Bend;Lift    Rehab Potential Good    PT Frequency 2x / week    PT Duration 8 weeks    PT Treatment/Interventions ADLs/Self Care Home Management;Aquatic Therapy;Traction;Moist Heat;Electrical Stimulation;Cryotherapy;Ultrasound;Gait training;Therapeutic exercise;Therapeutic activities;Functional mobility training;Stair training;Balance training;Neuromuscular re-education;Patient/family education;Manual techniques;Passive range of motion;Dry needling;Spinal Manipulations    PT Next Visit Plan mechanical traction lumbar intermittent    PT Home Exercise Plan Access Code: [email or web address]             Patient will benefit from skilled therapeutic intervention in order to improve the following deficits and impairments:  Decreased balance, Decreased mobility, Difficulty walking, Increased muscle spasms, Impaired sensation, Improper body mechanics, Decreased range of motion, Decreased strength, Impaired flexibility, Pain  Visit Diagnosis: Chronic right-sided low back pain with right-sided sciatica  Muscle weakness (generalized)  Pain in right leg     Problem List Patient Active Problem List   Diagnosis Date Noted   Chronic right-sided low back pain with right-sided sciatica 08/23/2021   Educated about COVID-19 virus infection 04/11/2020   Murmur 04/11/2020   Abnormal stress test  08/03/2015   Tenosynovitis, de Quervain 06/03/2014   Plantar fasciitis  06/03/2014   Type 2 diabetes mellitus without complication, without long-term current use of insulin ([place]) 04/05/2010   UNS ADVRS EFF OTH RX MEDICINAL&BIOLOGICAL SBSTNC 04/05/2010   INSOMNIA 03/09/2009   Anxiety state 03/30/2008   HYPERTENSION, BENIGN ESSENTIAL 02/18/2008   GERD 02/18/2008   Arthropathy 02/18/2008   OSA (obstructive sleep apnea) 02/18/2008   Ruben Im, PT 10/10/21 4:27 PM Phone: [phone] Fax: [phone]  Alvera Singh, PT 10/10/2021, 4:25 PM  [facility] @ [place], [place], [address] Phone: [phone]   Fax:  [phone]  Name: Justin Herrera MRN: [id number] Date of Birth: 02/15/1951

## 2021-10-10 NOTE — Patient Instructions (Signed)
[facility] LUMBAR TRACTION DEVICE The first home pneumatic lumbar traction device, the [facility] Lumbar Traction Device, i... Source [employer] [place] SKU: [id number] $399.00 158 reviews

## 2021-10-12 ENCOUNTER — Other Ambulatory Visit: Payer: Self-pay

## 2021-10-12 ENCOUNTER — Ambulatory Visit: Payer: Medicare PPO

## 2021-10-12 DIAGNOSIS — M5441 Lumbago with sciatica, right side: Secondary | ICD-10-CM | POA: Diagnosis not present

## 2021-10-12 DIAGNOSIS — G8929 Other chronic pain: Secondary | ICD-10-CM | POA: Diagnosis not present

## 2021-10-12 DIAGNOSIS — M6281 Muscle weakness (generalized): Secondary | ICD-10-CM

## 2021-10-12 DIAGNOSIS — M79604 Pain in right leg: Secondary | ICD-10-CM | POA: Diagnosis not present

## 2021-10-12 NOTE — Therapy (Signed)
[place] °[facility] °[address] °[place], [place], [address] °Phone: [phone]   Fax:  [phone] ° °Physical Therapy Treatment ° °Patient Details  °Name: Justin Herrera °MRN: [id number] °Date of Birth: 12/28/1950 °Referring Provider (PT): Dr. Stephen Fry ° ° °Encounter Date: 10/12/2021 ° ° PT End of Session - 10/12/21 1442   ° ° Visit Number 7   ° Number of Visits 16   ° Date for PT Re-Evaluation 11/07/21   ° Authorization Type [employer] (cohere) 16 visits: until 3/14   ° Authorization - Visit Number 7   ° Authorization - Number of Visits 16   ° PT Start Time 1445   ° PT Stop Time 1525   ° PT Time Calculation (min) 40 min   ° Activity Tolerance Patient tolerated treatment well   ° Behavior During Therapy WFL for tasks assessed/performed   ° °  °  ° °  ° ° °Past Medical History:  °Diagnosis Date  ° Anxiety   ° on meds  ° Arthritis   ° generalized  ° Depression   ° on meds  ° Diabetes mellitus   ° Type II-on meds  ° GERD (gastroesophageal reflux disease)   ° Heart murmur   ° slight murmur noted per pt  ° Hypertension   ° on meds  ° Insomnia   ° Sleep apnea, obstructive   ° wears a CPAP  ° Symptomatic cholelithiasis   ° ° °Past Surgical History:  °Procedure Laterality Date  ° CHOLECYSTECTOMY N/A 03/28/2018  ° Procedure: LAPAROSCOPIC CHOLECYSTECTOMY WITH INTRAOPERATIVE CHOLANGIOGRAM;  Surgeon: Wilson, Eric, MD;  [facility];  Service: General;  Laterality: N/A;  ° COLONOSCOPY  11/20/2013  ° per Dr. Stark, adenomatous polyps x 2, repeat in 5 yrs  (movi(prep good)  ° CYSTECTOMY Right 2014  ° right wrist  ° KNEE SURGERY  1968  ° RETINAL TEAR REPAIR CRYOTHERAPY    ° ° °There were no vitals filed for this visit. ° ° Subjective Assessment - 10/12/21 1516   ° ° Subjective Patient states that the increased pull on the traction may have cause some soreness.  He would like to continue but maybe back down on the intensity.  Overall, feeling good.   ° Pertinent  History left and right knee history   ° Limitations Standing;Walking   ° How long can you stand comfortably? 10 min   ° How long can you walk comfortably? 10 min   ° Diagnostic tests Xrays: 1. 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  2. Mild multilevel lower lumbar spondylolisthesis.  3. Mild bilateral lower lumbar facet arthropathy   ° Patient Stated Goals He hopes to be able to learn to manage his back pain, be able to stand as long as he wants and not have to lean on counters or on shopping cart at grocery store.  He would like to be able to do his usual work around the house without back pain or having to stop and rest   ° Currently in Pain? No/denies   ° Pain Score 0-No pain   ° Pain Onset More than a month ago   ° °  °  ° °  ° ° ° ° ° ° ° ° ° ° ° ° ° ° ° ° ° ° ° ° [facility] Adult PT Treatment/Exercise - 10/12/21 0001   ° °  ° Traction  ° Type of   Traction Lumbar    Min (lbs) 70    Max (lbs) 120    Hold Time 30    Rest Time 10    Time 15                       PT Short Term Goals - 10/10/21 1623       PT SHORT TERM GOAL #1   Title [facility] with initial HEP    Status Achieved      PT SHORT TERM GOAL #2   Title Centralize radicular symptoms    Status Partially Met               PT Long Term Goals - 09/12/21 1659       PT LONG TERM GOAL #1   Title [facility] with advance HEP    Time 8    Period Weeks    Status New    Target Date 11/07/21      PT LONG TERM GOAL #2   Title Eliminate radicular symptoms    Time 8    Period Weeks    Status New    Target Date 11/07/21      PT LONG TERM GOAL #3   Title Patient to be able to stand upright for at least 30 min while doing activities in the kitchen or in grocery store and avoid having to lean fwd on counters or on shopping cart.    Time 8    Period Weeks    Status New    Target Date 11/07/21      PT LONG TERM GOAL #4   Title Patient to be able to walk 30  min for exercise to help with managing his low back pain    Time 8    Period Weeks    Status New    Target Date 11/07/21                   Plan - 10/12/21 1518     Clinical Impression Statement Justin Herrera is responding well to traction.  He seems compliant with HEP.  Now that symptoms are more controlled, he may benefit from progression of core strengthening.  He has a trip to the [place] coming up and he would like to be able to do some walking and touring while there without worrying about pain increasing.    Personal Factors and Comorbidities Comorbidity 1    Comorbidities Htn    Examination-Activity Limitations Carry;Stand;Bend;Lift    Examination-Participation Restrictions Community Activity;Yard Work    Stability/Clinical Decision Making Stable/Uncomplicated    Designer, jewellery Low    Rehab Potential Good    PT Frequency 2x / week    PT Duration 8 weeks    PT Treatment/Interventions ADLs/Self Care Home Management;Aquatic Therapy;Traction;Moist Heat;Electrical Stimulation;Cryotherapy;Ultrasound;Gait training;Therapeutic exercise;Therapeutic activities;Functional mobility training;Stair training;Balance training;Neuromuscular re-education;Patient/family education;Manual techniques;Passive range of motion;Dry needling;Spinal Manipulations    PT Next Visit Plan mechanical traction lumbar intermittent: try progressing core strengthening (dead bug, quadruped alternating arm and leg, cat/camel)    PT Home Exercise Plan Access Code: [email or web address]    Consulted and Agree with Plan of Care Patient             Patient will benefit from skilled therapeutic intervention in order to improve the following deficits and impairments:  Decreased balance, Decreased mobility, Difficulty walking, Increased muscle spasms, Impaired sensation, Improper body mechanics, Decreased range of motion, Decreased strength, Impaired flexibility, Pain  Visit Diagnosis: Chronic right-sided  low back  pain with right-sided sciatica ° °Muscle weakness (generalized) ° °Pain in right leg ° ° ° ° °Problem List °Patient Active Problem List  ° Diagnosis Date Noted  ° Chronic right-sided low back pain with right-sided sciatica 08/23/2021  ° Educated about COVID-19 virus infection 04/11/2020  ° Murmur 04/11/2020  ° Abnormal stress test 08/03/2015  ° Tenosynovitis, de Quervain 06/03/2014  ° Plantar fasciitis 06/03/2014  ° Type 2 diabetes mellitus without complication, without long-term current use of insulin ([facility]) 04/05/2010  ° UNS ADVRS EFF OTH RX MEDICINAL&BIOLOGICAL SBSTNC 04/05/2010  ° INSOMNIA 03/09/2009  ° Anxiety state 03/30/2008  ° HYPERTENSION, BENIGN ESSENTIAL 02/18/2008  ° GERD 02/18/2008  ° Arthropathy 02/18/2008  ° OSA (obstructive sleep apnea) 02/18/2008  ° ° ° B. , PT °02/16/233:27 PM  ° °[place] °[facility] °[address] °[place], [place], [address] °Phone: [phone]   Fax:  [phone] ° °Name: Justin Herrera °MRN: [id number] °Date of Birth: 10/21/1950 ° ° ° °

## 2021-10-17 ENCOUNTER — Other Ambulatory Visit: Payer: Self-pay

## 2021-10-17 ENCOUNTER — Ambulatory Visit: Payer: Medicare PPO | Admitting: Physical Therapy

## 2021-10-17 DIAGNOSIS — M5441 Lumbago with sciatica, right side: Secondary | ICD-10-CM | POA: Diagnosis not present

## 2021-10-17 DIAGNOSIS — M79604 Pain in right leg: Secondary | ICD-10-CM

## 2021-10-17 DIAGNOSIS — M6281 Muscle weakness (generalized): Secondary | ICD-10-CM

## 2021-10-17 DIAGNOSIS — G8929 Other chronic pain: Secondary | ICD-10-CM | POA: Diagnosis not present

## 2021-10-17 NOTE — Patient Instructions (Signed)
Access Code: [email or web address] URL: [email or web address] Date: 10/17/2021 Prepared by: Ruben Im  Exercises Standing Hamstring Stretch on Chair - 2 x daily - 7 x weekly - 1 sets - 3 reps - 30 sec hold Standing Quad Stretch with Table and Chair Support - 1 x daily - 7 x weekly - 1 sets - 3 reps - 30 sec hold Supine Posterior Pelvic Tilt - 1 x daily - 7 x weekly - 1 sets - 3 reps - 30 sec hold Supine Transversus Abdominis Bracing - Hands on Stomach - 1 x daily - 7 x weekly - 1 sets - 5 reps - 5 hold Hooklying Isometric Hip Flexion - 1 x daily - 7 x weekly - 1 sets - 5 reps - 5 hold Supine 90/90 Abdominal Bracing - 1 x daily - 7 x weekly - 1 sets - 10 reps Seated Transversus Abdominis Bracing with PLB - 1 x daily - 7 x weekly - 1 sets - 10 reps Standing Transverse Abdominis Contraction - 1 x daily - 7 x weekly - 1 sets - 10 reps Hip Flexor Stretch on Step - 1 x daily - 7 x weekly - 1 sets - 10 reps Self Traction in Standing with Counter Top - 1 x daily - 7 x weekly - 1 sets - 10 reps - 10 hold Prone Femoral Nerve Mobilization - 1 x daily - 7 x weekly - 1 sets - 10 reps Prone Femoral Nerve Tensioner - 1 x daily - 7 x weekly - 1 sets - 10 reps Sidelying Femoral Nerve Glide - Top Leg - 1 x daily - 7 x weekly - 1 sets - 10 reps

## 2021-10-17 NOTE — Therapy (Signed)
[facility] @ [place], [place], [address] Phone: [phone]   Fax:  [phone]  Physical Therapy Treatment  Patient Details  Name: Justin Herrera MRN: [id number] Date of Birth: Aug 14, 1951 Referring Provider (PT): Dr. Alysia Penna   Encounter Date: 10/17/2021   PT End of Session - 10/17/21 1528     Visit Number 8    Number of Visits 16    Date for PT Re-Evaluation 11/07/21    Authorization Type [employer] (cohere) 16 visits: until 3/14    Authorization - Visit Number 8    Authorization - Number of Visits 16    PT Start Time 4076    PT Stop Time 1605   traction   PT Time Calculation (min) 35 min    Activity Tolerance Patient tolerated treatment well             Past Medical History:  Diagnosis Date   Anxiety    on meds   Arthritis    generalized   Depression    on meds   Diabetes mellitus    Type II-on meds   GERD (gastroesophageal reflux disease)    Heart murmur    slight murmur noted per pt   Hypertension    on meds   Insomnia    Sleep apnea, obstructive    wears a CPAP   Symptomatic cholelithiasis     Past Surgical History:  Procedure Laterality Date   CHOLECYSTECTOMY N/A 03/28/2018   Procedure: LAPAROSCOPIC CHOLECYSTECTOMY WITH INTRAOPERATIVE CHOLANGIOGRAM;  Surgeon: [name], MD;  Location: [facility];  Service: General;  Laterality: N/A;   COLONOSCOPY  11/20/2013   per Dr. Fuller Plan, adenomatous polyps x 2, repeat in 5 yrs  (movi(prep good)   CYSTECTOMY Right 2014   right wrist   [place]      There were no vitals filed for this visit.   Subjective Assessment - 10/17/21 1529     Subjective Over the weekend a little discomfort but yesterday and today it's been fine.  The only thing that has helped me is the traction.  My back and abdominals are strong.    Pertinent History left and right knee history    Diagnostic tests Xrays:  1. 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  2. Mild multilevel lower lumbar spondylolisthesis.  3. Mild bilateral lower lumbar facet arthropathy    Patient Stated Goals He hopes to be able to learn to manage his back pain, be able to stand as long as he wants and not have to lean on counters or on shopping cart at grocery store.  He would like to be able to do his usual work around the house without back pain or having to stop and rest    Currently in Pain? Yes    Pain Score 2     Pain Location Back                               [facility] Adult PT Treatment/Exercise - 10/17/21 0001       Self-Care   Other Self-Care Comments  pt declines the need for instruction in exercise at this time      Traction   Type of Traction Lumbar    Min (lbs) 70    Max (lbs) 115  Hold Time 45    Rest Time 15    Time 15                     PT Education - 10/17/21 1601     Education Details femoral nerve glides    Person(s) Educated Patient    Methods Explanation;Handout    Comprehension Verbalized understanding              PT Short Term Goals - 10/10/21 1623       PT SHORT TERM GOAL #1   Title [facility] with initial HEP    Status Achieved      PT SHORT TERM GOAL #2   Title Centralize radicular symptoms    Status Partially Met               PT Long Term Goals - 09/12/21 1659       PT LONG TERM GOAL #1   Title [facility] with advance HEP    Time 8    Period Weeks    Status New    Target Date 11/07/21      PT LONG TERM GOAL #2   Title Eliminate radicular symptoms    Time 8    Period Weeks    Status New    Target Date 11/07/21      PT LONG TERM GOAL #3   Title Patient to be able to stand upright for at least 30 min while doing activities in the kitchen or in grocery store and avoid having to lean fwd on counters or on shopping cart.    Time 8    Period Weeks    Status New     Target Date 11/07/21      PT LONG TERM GOAL #4   Title Patient to be able to walk 30 min for exercise to help with managing his low back pain    Time 8    Period Weeks    Status New    Target Date 11/07/21                   Plan - 10/17/21 1551     Clinical Impression Statement The patient reports a good response to mechanical traction with symptoms still present but lower in intensity.  He reports ADLs like climbing are more limited by his knees rather than this problem.  Offered instruction in HEP for strengthening however he does not feel it is necessary at this time preferring to focus on the traction since it has provided the most relief.  Discussed the benefit of neural gliding for femoral nerve and added to HEP.    Personal Factors and Comorbidities Comorbidity 1    Examination-Activity Limitations Carry;Stand;Bend;Lift    Rehab Potential Good    PT Frequency 2x / week    PT Duration 8 weeks    PT Treatment/Interventions ADLs/Self Care Home Management;Aquatic Therapy;Traction;Moist Heat;Electrical Stimulation;Cryotherapy;Ultrasound;Gait training;Therapeutic exercise;Therapeutic activities;Functional mobility training;Stair training;Balance training;Neuromuscular re-education;Patient/family education;Manual techniques;Passive range of motion;Dry needling;Spinal Manipulations    PT Next Visit Plan mechanical traction lumbar intermittent    PT Home Exercise Plan Access Code: [email or web address]             Patient will benefit from skilled therapeutic intervention in order to improve the following deficits and impairments:  Decreased balance, Decreased mobility, Difficulty walking, Increased muscle spasms, Impaired sensation, Improper body mechanics, Decreased range of motion, Decreased strength, Impaired flexibility, Pain  Visit Diagnosis: Chronic right-sided low back pain with  right-sided sciatica  Muscle weakness (generalized)  Pain in right leg     Problem List Patient  Active Problem List   Diagnosis Date Noted   Chronic right-sided low back pain with right-sided sciatica 08/23/2021   Educated about COVID-19 virus infection 04/11/2020   Murmur 04/11/2020   Abnormal stress test 08/03/2015   Tenosynovitis, de Quervain 06/03/2014   Plantar fasciitis 06/03/2014   Type 2 diabetes mellitus without complication, without long-term current use of insulin ([place]) 04/05/2010   UNS ADVRS EFF OTH RX MEDICINAL&BIOLOGICAL SBSTNC 04/05/2010   INSOMNIA 03/09/2009   Anxiety state 03/30/2008   HYPERTENSION, BENIGN ESSENTIAL 02/18/2008   GERD 02/18/2008   Arthropathy 02/18/2008   OSA (obstructive sleep apnea) 02/18/2008    Alvera Singh, PT 10/17/2021, 4:02 PM  [facility] @ [place], [place], [address] Phone: [phone]   Fax:  [phone]  Name: Justin Herrera MRN: [id number] Date of Birth: [birth year]

## 2021-10-18 ENCOUNTER — Telehealth: Payer: Self-pay | Admitting: Family Medicine

## 2021-10-18 NOTE — Telephone Encounter (Signed)
Left message for patient to call back and schedule Medicare Annual Wellness Visit (AWV) either virtually or in office. Left  my jabber number [phone]    AWV-I per [place] 11/25/17 ; please schedule at anytime with [facility] Nurse Health Advisor 1 or 2   This should be a 45 minute visit.  

## 2021-10-19 ENCOUNTER — Ambulatory Visit: Payer: Medicare PPO | Admitting: Physical Therapy

## 2021-10-19 ENCOUNTER — Other Ambulatory Visit: Payer: Self-pay

## 2021-10-19 DIAGNOSIS — M5441 Lumbago with sciatica, right side: Secondary | ICD-10-CM | POA: Diagnosis not present

## 2021-10-19 DIAGNOSIS — M79604 Pain in right leg: Secondary | ICD-10-CM | POA: Diagnosis not present

## 2021-10-19 DIAGNOSIS — M6281 Muscle weakness (generalized): Secondary | ICD-10-CM

## 2021-10-19 DIAGNOSIS — G8929 Other chronic pain: Secondary | ICD-10-CM

## 2021-10-19 NOTE — Therapy (Signed)
[facility] @ [facility] [place], [place], [address] Phone: [phone]   Fax:  [phone]  Physical Therapy Treatment  Patient Details  Name: Justin Herrera MRN: [id number] Date of Birth: 07-12-1951 Referring Provider (PT): Dr. Alysia Penna   Encounter Date: 10/19/2021   PT End of Session - 10/19/21 1540     Visit Number 9    Number of Visits 16    Date for PT Re-Evaluation 11/07/21    Authorization Type [employer] (cohere) 16 visits: until 3/14    Authorization - Visit Number 9    Authorization - Number of Visits 16    PT Start Time 4496   traction   PT Stop Time 7591    PT Time Calculation (min) 25 min    Activity Tolerance Patient tolerated treatment well             Past Medical History:  Diagnosis Date   Anxiety    on meds   Arthritis    generalized   Depression    on meds   Diabetes mellitus    Type II-on meds   GERD (gastroesophageal reflux disease)    Heart murmur    slight murmur noted per pt   Hypertension    on meds   Insomnia    Sleep apnea, obstructive    wears a CPAP   Symptomatic cholelithiasis     Past Surgical History:  Procedure Laterality Date   CHOLECYSTECTOMY N/A 03/28/2018   Procedure: LAPAROSCOPIC CHOLECYSTECTOMY WITH INTRAOPERATIVE CHOLANGIOGRAM;  Surgeon: [name], MD;  Location: [facility];  Service: General;  Laterality: N/A;   COLONOSCOPY  11/20/2013   per Dr. Fuller Plan, adenomatous polyps x 2, repeat in 5 yrs  (movi(prep good)   CYSTECTOMY Right 2014   right wrist   [place]      There were no vitals filed for this visit.   Subjective Assessment - 10/19/21 1541     Subjective The traction was just right.    Diagnostic tests Xrays: 1. 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  2. Mild multilevel lower lumbar spondylolisthesis.  3. Mild bilateral lower lumbar  facet arthropathy    Patient Stated Goals He hopes to be able to learn to manage his back pain, be able to stand as long as he wants and not have to lean on counters or on shopping cart at grocery store.  He would like to be able to do his usual work around the house without back pain or having to stop and rest    Currently in Pain? Yes    Pain Score 2     Pain Location Back    Pain Radiating Towards right buttock and thigh intermittently                               [facility] Adult PT Treatment/Exercise - 10/19/21 0001       Traction   Type of Traction Lumbar    Min (lbs) 70    Max (lbs) 115    Hold Time 45    Rest Time 15    Time 15                       PT Short Term Goals - 10/19/21 1601  PT SHORT TERM GOAL #1   Title Independence with initial HEP    Status Achieved      PT SHORT TERM GOAL #2   Title Centralize radicular symptoms    Status Partially Met               PT Long Term Goals - 09/12/21 1659       PT LONG TERM GOAL #1   Title Independence with advance HEP    Time 8    Period Weeks    Status New    [place] Date 11/07/21      PT LONG TERM GOAL #2   Title Eliminate radicular symptoms    Time 8    Period Weeks    Status New    Target Date 11/07/21      PT LONG TERM GOAL #3   Title Patient to be able to stand upright for at least 30 min while doing activities in the kitchen or in grocery store and avoid having to lean fwd on counters or on shopping cart.    Time 8    Period Weeks    Status New    Target Date 11/07/21      PT LONG TERM GOAL #4   Title Patient to be able to walk 30 min for exercise to help with managing his low back pain    Time 8    Period Weeks    Status New    Target Date 11/07/21                   Plan - 10/19/21 1543     Clinical Impression Statement The patient reports the traction seems to be helping.  He didn't see any immediate changes with neural gliding exercises  but agrees it will just take time.  He moves well on/off the table and his overall response to traction continues to be very good.    Rehab Potential Good    PT Frequency 2x / week    PT Duration 8 weeks    PT Treatment/Interventions ADLs/Self Care Home Management;Aquatic Therapy;Traction;Moist Heat;Electrical Stimulation;Cryotherapy;Ultrasound;Gait training;Therapeutic exercise;Therapeutic activities;Functional mobility training;Stair training;Balance training;Neuromuscular re-education;Patient/family education;Manual techniques;Passive range of motion;Dry needling;Spinal Manipulations    PT Next Visit Plan 10th visit progress note; check goals;   mechanical traction lumbar intermittent    PT Home Exercise Plan Access Code: [email or web address]             Patient will benefit from skilled therapeutic intervention in order to improve the following deficits and impairments:  Decreased balance, Decreased mobility, Difficulty walking, Increased muscle spasms, Impaired sensation, Improper body mechanics, Decreased range of motion, Decreased strength, Impaired flexibility, Pain  Visit Diagnosis: Chronic right-sided low back pain with right-sided sciatica  Muscle weakness (generalized)  Pain in right leg     Problem List Patient Active Problem List   Diagnosis Date Noted   Chronic right-sided low back pain with right-sided sciatica 08/23/2021   Educated about COVID-19 virus infection 04/11/2020   Murmur 04/11/2020   Abnormal stress test 08/03/2015   Tenosynovitis, de Quervain 06/03/2014   Plantar fasciitis 06/03/2014   Type 2 diabetes mellitus without complication, without long-term current use of insulin ([place]) 04/05/2010   UNS ADVRS EFF OTH RX MEDICINAL&BIOLOGICAL SBSTNC 04/05/2010   INSOMNIA 03/09/2009   Anxiety state 03/30/2008   HYPERTENSION, BENIGN ESSENTIAL 02/18/2008   GERD 02/18/2008   Arthropathy 02/18/2008   OSA (obstructive sleep apnea) 02/18/2008   Ruben Im, PT 10/19/21  4:02 PM  Phone: [phone] Fax: [phone]  Alvera Singh, PT 10/19/2021, 4:02 PM  [facility] @ [place], [place], [address] Phone: [phone]   Fax:  [phone]  Name: KARTIER BENNISON MRN: [id number] Date of Birth: 10-20-1950

## 2021-10-24 ENCOUNTER — Ambulatory Visit: Payer: Medicare PPO | Admitting: Physical Therapy

## 2021-10-24 ENCOUNTER — Other Ambulatory Visit: Payer: Self-pay

## 2021-10-24 DIAGNOSIS — M79604 Pain in right leg: Secondary | ICD-10-CM

## 2021-10-24 DIAGNOSIS — M5441 Lumbago with sciatica, right side: Secondary | ICD-10-CM | POA: Diagnosis not present

## 2021-10-24 DIAGNOSIS — G8929 Other chronic pain: Secondary | ICD-10-CM

## 2021-10-24 DIAGNOSIS — M6281 Muscle weakness (generalized): Secondary | ICD-10-CM

## 2021-10-24 NOTE — Therapy (Signed)
[facility] @ [facility] [place], [place], [address] Phone: [phone]   Fax:  [phone]  Physical Therapy Treatment  Patient Details  Name: Justin Herrera MRN: [id number] Date of Birth: May 19, 1951 Referring Provider (PT): Dr. Alysia Penna   Encounter Date: 10/24/2021   PT End of Session - 10/24/21 1705     Visit Number 10    Number of Visits 16    Date for PT Re-Evaluation 11/07/21    Authorization Type [employer] (cohere) 16 visits: until 3/14    Authorization - Visit Number 10    Authorization - Number of Visits 16    PT Start Time 6438    PT Stop Time 1610    PT Time Calculation (min) 40 min    Activity Tolerance Patient tolerated treatment well             Past Medical History:  Diagnosis Date   Anxiety    on meds   Arthritis    generalized   Depression    on meds   Diabetes mellitus    Type II-on meds   GERD (gastroesophageal reflux disease)    Heart murmur    slight murmur noted per pt   Hypertension    on meds   Insomnia    Sleep apnea, obstructive    wears a CPAP   Symptomatic cholelithiasis     Past Surgical History:  Procedure Laterality Date   CHOLECYSTECTOMY N/A 03/28/2018   Procedure: LAPAROSCOPIC CHOLECYSTECTOMY WITH INTRAOPERATIVE CHOLANGIOGRAM;  Surgeon: [name], MD;  Location: [facility];  Service: General;  Laterality: N/A;   COLONOSCOPY  11/20/2013   per Dr. Fuller Plan, adenomatous polyps x 2, repeat in 5 yrs  (movi(prep good)   CYSTECTOMY Right 2014   right wrist   [place]      There were no vitals filed for this visit.   Subjective Assessment - 10/24/21 1552     Subjective The patient reports the traction seems to be the only thing (vs. exercise) that helps.  He states his right thigh pain is less intense and intermittent.  His back pain is relieved overnight but in the AM, upon getting out of bed can be quite difficult  and things feel "out of place".    Pertinent History left and right knee history    Diagnostic tests Xrays: 1. 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  2. Mild multilevel lower lumbar spondylolisthesis.  3. Mild bilateral lower lumbar facet arthropathy    Patient Stated Goals He hopes to be able to learn to manage his back pain, be able to stand as long as he wants and not have to lean on counters or on shopping cart at grocery store.  He would like to be able to do his usual work around the house without back pain or having to stop and rest    Currently in Pain? Yes    Pain Score 2     Pain Location Back    Pain Orientation Right    Pain Type Chronic pain                [facility] PT Assessment - 10/24/21 0001       AROM   Lumbar Flexion WNL    Lumbar - Right Side Bend fingertips to just above joint line with leg pain    Lumbar -  Left Side Bend fingertips to just above joint line    Lumbar - Right Rotation WNL    Lumbar - Left Rotation WNL                           [facility] Adult PT Treatment/Exercise - 10/24/21 0001       Self-Care   Other Self-Care Comments  discussed loaded vs unloaded positions and functional implications; checked progress toward goals      Traction   Type of Traction Lumbar    Min (lbs) 70    Max (lbs) 115    Hold Time 45    Rest Time 15    Time 15                       PT Short Term Goals - 10/24/21 1558       PT SHORT TERM GOAL #1   Title Independence with initial HEP      PT SHORT TERM GOAL #2   Title Centralize radicular symptoms    Status Partially Met               PT Long Term Goals - 10/24/21 1558       PT LONG TERM GOAL #1   Title Independence with advance HEP    Time 8    Period Weeks    Status Partially Met      PT LONG TERM GOAL #2   Title Eliminate radicular symptoms    Time 8    Period Weeks    Status Partially Met      PT  LONG TERM GOAL #3   Title Patient to be able to stand upright for at least 30 min while doing activities in the kitchen or in grocery store and avoid having to lean fwd on counters or on shopping cart.    Status Partially Met      PT LONG TERM GOAL #4   Title Patient to be able to walk 30 min for exercise to help with managing his low back pain    Status Partially Met                   Plan - 10/24/21 1559     Clinical Impression Statement The patient reports improving right thigh symptoms however still present along with right back pain in the mornings upon rising from bed.  He reports a sensation of something "out of place" but he is able to typically relieve symptoms with self traction maneuvers.  He reports with longer duration standing and walking he will sometimes get that sensation as well.  Symptoms relieved in the recliner and when lying in bed.  We have discussed in depth anatomical considerations including unloading vs. loading and gapping techniques for symptom relief.  We have also discussed home traction apparatuses.  Recommend a tapering of visits over a longer period of time for further symptom relief and help prepare him for his upcoming travel to [place] this summer.    Personal Factors and Comorbidities Comorbidity 1    Examination-Activity Limitations Carry;Stand;Bend;Lift    Rehab Potential Good    PT Frequency 2x / week    PT Duration 8 weeks    PT Treatment/Interventions ADLs/Self Care Home Management;Aquatic Therapy;Traction;Moist Heat;Electrical Stimulation;Cryotherapy;Ultrasound;Gait training;Therapeutic exercise;Therapeutic activities;Functional mobility training;Stair training;Balance training;Neuromuscular re-education;Patient/family education;Manual techniques;Passive range of motion;Dry needling;Spinal Manipulations    PT Next Visit Plan mechanical traction;  date  extension authorization/ERO next visit to decrease treatment frequency to every 2 weeks    PT  Home Exercise Plan Access Code: [email or web address]             Patient will benefit from skilled therapeutic intervention in order to improve the following deficits and impairments:  Decreased balance, Decreased mobility, Difficulty walking, Increased muscle spasms, Impaired sensation, Improper body mechanics, Decreased range of motion, Decreased strength, Impaired flexibility, Pain  Visit Diagnosis: Chronic right-sided low back pain with right-sided sciatica  Muscle weakness (generalized)  Pain in right leg     Problem List Patient Active Problem List   Diagnosis Date Noted   Chronic right-sided low back pain with right-sided sciatica 08/23/2021   Educated about COVID-19 virus infection 04/11/2020   Murmur 04/11/2020   Abnormal stress test 08/03/2015   Tenosynovitis, de Quervain 06/03/2014   Plantar fasciitis 06/03/2014   Type 2 diabetes mellitus without complication, without long-term current use of insulin ([place]) 04/05/2010   UNS ADVRS EFF OTH RX MEDICINAL&BIOLOGICAL SBSTNC 04/05/2010   INSOMNIA 03/09/2009   Anxiety state 03/30/2008   HYPERTENSION, BENIGN ESSENTIAL 02/18/2008   GERD 02/18/2008   Arthropathy 02/18/2008   OSA (obstructive sleep apnea) 02/18/2008   Ruben Im, PT 10/24/21 5:07 PM Phone: [phone] Fax: [phone]  Alvera Singh, PT 10/24/2021, 5:06 PM  [facility] @ [place], [place], [address] Phone: [phone]   Fax:  [phone]  Name: Justin Herrera MRN: [id number] Date of Birth: 26-Aug-1951

## 2021-10-26 ENCOUNTER — Ambulatory Visit: Payer: Medicare PPO | Attending: Family Medicine | Admitting: Physical Therapy

## 2021-10-26 ENCOUNTER — Other Ambulatory Visit: Payer: Self-pay

## 2021-10-26 DIAGNOSIS — M79604 Pain in right leg: Secondary | ICD-10-CM | POA: Insufficient documentation

## 2021-10-26 DIAGNOSIS — G8929 Other chronic pain: Secondary | ICD-10-CM | POA: Insufficient documentation

## 2021-10-26 DIAGNOSIS — M5441 Lumbago with sciatica, right side: Secondary | ICD-10-CM | POA: Diagnosis not present

## 2021-10-26 DIAGNOSIS — M6281 Muscle weakness (generalized): Secondary | ICD-10-CM | POA: Insufficient documentation

## 2021-10-26 NOTE — Therapy (Signed)
[facility] ?[facility] @ [facility] ?[place], [place], [address] ?Phone: [phone]   Fax:  [phone] ? ?Physical Therapy Treatment ? ?Patient Details  ?Name: Justin Herrera ?MRN: [id number] ?Date of Birth: June 30, 1951 ?Referring Provider (PT): Dr. Alysia Penna ? ? ?Encounter Date: 10/26/2021 ? ? PT End of Session - 10/26/21 1711   ? ? Visit Number 11   ? Number of Visits 16   ? Date for PT Re-Evaluation 11/07/21   ? Authorization Type [employer] (cohere) 16 visits: until 3/14   ? Authorization - Visit Number 11   ? Authorization - Number of Visits 16   ? PT Start Time 1530   ? PT Stop Time 1600   traction  ? PT Time Calculation (min) 30 min   ? Activity Tolerance Patient tolerated treatment well   ? ?  ?  ? ?  ? ? ?Past Medical History:  ?Diagnosis Date  ? Anxiety   ? on meds  ? Arthritis   ? generalized  ? Depression   ? on meds  ? Diabetes mellitus   ? Type II-on meds  ? GERD (gastroesophageal reflux disease)   ? Heart murmur   ? slight murmur noted per pt  ? Hypertension   ? on meds  ? Insomnia   ? Sleep apnea, obstructive   ? wears a CPAP  ? Symptomatic cholelithiasis   ? ? ?Past Surgical History:  ?Procedure Laterality Date  ? CHOLECYSTECTOMY N/A 03/28/2018  ? Procedure: LAPAROSCOPIC CHOLECYSTECTOMY WITH INTRAOPERATIVE CHOLANGIOGRAM;  Surgeon: [name], MD;  Location: [facility];  Service: General;  Laterality: N/A;  ? COLONOSCOPY  11/20/2013  ? per Dr. Fuller Plan, adenomatous polyps x 2, repeat in 5 yrs  (movi(prep good)  ? CYSTECTOMY Right 2014  ? right wrist  ? [place]  ? RETINAL TEAR REPAIR CRYOTHERAPY    ? ? ?There were no vitals filed for this visit. ? ? Subjective Assessment - 10/26/21 1712   ? ? Subjective This last time of traction helped the most yet.  I've only needed to do little minor adjustments today so far.   ? Diagnostic tests Xrays: 1. 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   2. Mild multilevel lower lumbar spondylolisthesis.  3. Mild bilateral lower lumbar facet arthropathy   ? Currently in Pain? Yes   ? Pain Score 1    ? Pain Location Back   ? Pain Orientation Right   ? Pain Radiating Towards right buttock and thigh intermittently   ? ?  ?  ? ?  ? ? ? ? ? ? ? ? ? ? ? ? ? ? ? ? ? ? ? ? [facility] Adult PT Treatment/Exercise - 10/26/21 0001   ? ?  ? Traction  ? Type of Traction Lumbar   ? Min (lbs) 70   ? Max (lbs) 115   ? Hold Time 45   ? Rest Time 15   ? Time 15   ? ?  ?  ? ?  ? ? ? ? ? ? ? ? ? ? ? ? PT Short Term Goals - 10/24/21 1558   ? ?  ? PT SHORT TERM GOAL #1  ? Title Independence with initial HEP   ?  ? PT SHORT TERM GOAL #2  ? Title Centralize radicular symptoms   ? Status Partially Met   ? ?  ?  ? ?  ? ? ? ?  PT Long Term Goals - 10/24/21 1558   ? ?  ? PT LONG TERM GOAL #1  ? Title Independence with advance HEP   ? Time 8   ? Period Weeks   ? Status Partially Met   ?  ? PT LONG TERM GOAL #2  ? Title Eliminate radicular symptoms   ? Time 8   ? Period Weeks   ? Status Partially Met   ?  ? PT LONG TERM GOAL #3  ? Title Patient to be able to stand upright for at least 30 min while doing activities in the kitchen or in grocery store and avoid having to lean fwd on counters or on shopping cart.   ? Status Partially Met   ?  ? PT LONG TERM GOAL #4  ? Title Patient to be able to walk 30 min for exercise to help with managing his low back pain   ? Status Partially Met   ? ?  ?  ? ?  ? ? ? ? ? ? ? ? Plan - 10/26/21 1713   ? ? Clinical Impression Statement The patient reports the best overall response to mechanical traction and has responded very favorably to symptom reduction and centralization.  Therapist monitoring response to determine best parameters.   ? Rehab Potential Good   ? PT Frequency 2x / week   ? PT Treatment/Interventions ADLs/Self Care Home Management;Aquatic Therapy;Traction;Moist Heat;Electrical Stimulation;Cryotherapy;Ultrasound;Gait training;Therapeutic  exercise;Therapeutic activities;Functional mobility training;Stair training;Balance training;Neuromuscular re-education;Patient/family education;Manual techniques;Passive range of motion;Dry needling;Spinal Manipulations   ? PT Next Visit Plan mechanical traction;  date extension authorization/ERO next visit to decrease treatment frequency to every 2 weeks   ? PT Home Exercise Plan Access Code: [email or web address]   ? ?  ?  ? ?  ? ? ?Patient will benefit from skilled therapeutic intervention in order to improve the following deficits and impairments:  Decreased balance, Decreased mobility, Difficulty walking, Increased muscle spasms, Impaired sensation, Improper body mechanics, Decreased range of motion, Decreased strength, Impaired flexibility, Pain ? ?Visit Diagnosis: ?Chronic right-sided low back pain with right-sided sciatica ? ?Muscle weakness (generalized) ? ?Pain in right leg ? ? ? ? ?Problem List ?Patient Active Problem List  ? Diagnosis Date Noted  ? Chronic right-sided low back pain with right-sided sciatica 08/23/2021  ? Educated about COVID-19 virus infection 04/11/2020  ? Murmur 04/11/2020  ? Abnormal stress test 08/03/2015  ? Tenosynovitis, de Quervain 06/03/2014  ? Plantar fasciitis 06/03/2014  ? Type 2 diabetes mellitus without complication, without long-term current use of insulin ([place]) 04/05/2010  ? UNS ADVRS EFF OTH RX MEDICINAL&BIOLOGICAL SBSTNC 04/05/2010  ? INSOMNIA 03/09/2009  ? Anxiety state 03/30/2008  ? HYPERTENSION, BENIGN ESSENTIAL 02/18/2008  ? GERD 02/18/2008  ? Arthropathy 02/18/2008  ? OSA (obstructive sleep apnea) 02/18/2008  ? ?Ruben Im, PT ?10/26/21 5:16 PM ?Phone: [phone] ?Fax: [phone]  ?Alvera Singh, PT ?10/26/2021, 5:16 PM ? ?[facility] ?[facility] ?[place], [place], [address] ?Phone: [phone]   Fax:  [phone] ? ?Name: KOLE HILYARD ?MRN: [id number] ?Date of Birth: 03-May-1951 ? ? ? ?

## 2021-11-07 ENCOUNTER — Other Ambulatory Visit: Payer: Self-pay

## 2021-11-07 ENCOUNTER — Ambulatory Visit: Payer: Medicare PPO | Admitting: Physical Therapy

## 2021-11-07 DIAGNOSIS — M79604 Pain in right leg: Secondary | ICD-10-CM

## 2021-11-07 DIAGNOSIS — G8929 Other chronic pain: Secondary | ICD-10-CM | POA: Diagnosis not present

## 2021-11-07 DIAGNOSIS — M6281 Muscle weakness (generalized): Secondary | ICD-10-CM

## 2021-11-07 DIAGNOSIS — M5441 Lumbago with sciatica, right side: Secondary | ICD-10-CM | POA: Diagnosis not present

## 2021-11-07 NOTE — Therapy (Signed)
[facility] ?[place], [place], [address] ?Phone: [phone]   Fax:  [phone] ? ?Physical Therapy Treatment/Discharge summary  ? ?Patient Details  ?Name: Justin Herrera ?MRN: [id number] ?Date of Birth: 09-24-1950 ?Referring Provider (PT): Dr. Alysia Penna ? ? ?Encounter Date: 11/07/2021 ? ? PT End of Session - 11/07/21 1544   ? ? Visit Number 12   ? Number of Visits 16   ? Date for PT Re-Evaluation 11/07/21   ? Authorization Type [employer] Medicare (cohere) 16 visits: until 3/14   ? Authorization - Visit Number 12   ? Authorization - Number of Visits 16   ? PT Start Time 1530   ? PT Stop Time 1600   ? PT Time Calculation (min) 30 min   ? Activity Tolerance Patient tolerated treatment well   ? ?  ?  ? ?  ? ? ?Past Medical History:  ?Diagnosis Date  ? Anxiety   ? on meds  ? Arthritis   ? generalized  ? Depression   ? on meds  ? Diabetes mellitus   ? Type II-on meds  ? GERD (gastroesophageal reflux disease)   ? Heart murmur   ? slight murmur noted per pt  ? Hypertension   ? on meds  ? Insomnia   ? Sleep apnea, obstructive   ? wears a CPAP  ? Symptomatic cholelithiasis   ? ? ?Past Surgical History:  ?Procedure Laterality Date  ? CHOLECYSTECTOMY N/A 03/28/2018  ? Procedure: LAPAROSCOPIC CHOLECYSTECTOMY WITH INTRAOPERATIVE CHOLANGIOGRAM;  Surgeon: [name], MD;  Location: [facility];  Service: General;  Laterality: N/A;  ? COLONOSCOPY  11/20/2013  ? per Dr. Fuller Plan, adenomatous polyps x 2, repeat in 5 yrs  (movi(prep good)  ? CYSTECTOMY Right 2014  ? right wrist  ? [place]  ? RETINAL TEAR REPAIR CRYOTHERAPY    ? ? ?There were no vitals filed for this visit. ? ? ? ? ? ? [facility] PT Assessment - 11/07/21 0001   ? ?  ? AROM  ? Lumbar Flexion WNL   ? Lumbar - Right Side Bend fingertips to just above joint line with leg pain   ? Lumbar - Left Side Bend fingertips to just above joint line   ? Lumbar - Right Rotation WNL   ? Lumbar - Left Rotation WNL    ? ?  ?  ? ?  ? ? ? ? ? ? ? ? ? ? ? ? ? ? ? ? [facility] Adult PT Treatment/Exercise - 11/07/21 0001   ? ?  ? Self-Care  ? Other Self-Care Comments  discussion of decompression strategies for traveling to the [place] including mid day supine break, leaning against wall or propping on knees   ?  ? Traction  ? Type of Traction Lumbar   ? Min (lbs) 70   ? Max (lbs) 115   ? Hold Time 45   ? Rest Time 15   ? Time 15   ? ?  ?  ? ?  ? ? ? ? ? ? ? ? ? ? ? ? PT Short Term Goals - 10/24/21 1558   ? ?  ? PT SHORT TERM GOAL #1  ? Title Independence with initial HEP   ?  ? PT SHORT TERM GOAL #2  ? Title Centralize radicular symptoms   ? Status Partially Met   ? ?  ?  ? ?  ? ? ? ?  PT Long Term Goals - 11/07/21 1546   ? ?  ? PT LONG TERM GOAL #1  ? Title Independence with advance HEP   ? Status Achieved   ?  ? PT LONG TERM GOAL #2  ? Title Eliminate radicular symptoms   ? Status Partially Met   ?  ? PT LONG TERM GOAL #3  ? Title Patient to be able to stand upright for at least 30 min while doing activities in the kitchen or in grocery store and avoid having to lean fwd on counters or on shopping cart.   ? Status Achieved   ?  ? PT LONG TERM GOAL #4  ? Title Patient to be able to walk 30 min for exercise to help with managing his low back pain   ? Status Achieved   ? ?  ?  ? ?  ? ? ? ? ? ? ? ? Plan - 11/07/21 1546   ? ? Clinical Impression Statement The patient has responded very well to mechanical traction and spinal decompression/unloading strategies.  He reports less frequent and less intense LE symptoms.  He is independent in using these strategies to walk the dog or with prolonged standing.  On initial assessment in January he was limited in walking and standing tolerance to 10 min, now he is able to walk 2 miles or over 30 minutes.   He is satisfied with his current level and expresses readiness for discharge from PT at this time.  Majority of goals met.   ? Personal Factors and Comorbidities Comorbidity 1   ?  Examination-Activity Limitations Carry;Stand;Bend;Lift   ? Rehab Potential Good   ? PT Frequency 2x / week   ? PT Next Visit Plan discharge   ? PT Home Exercise Plan Access Code: [email or web address]   ? ?  ?  ? ?  ? ? ?Patient will benefit from skilled therapeutic intervention in order to improve the following deficits and impairments:  Decreased balance, Decreased mobility, Difficulty walking, Increased muscle spasms, Impaired sensation, Improper body mechanics, Decreased range of motion, Decreased strength, Impaired flexibility, Pain ? ?Visit Diagnosis: ?Chronic right-sided low back pain with right-sided sciatica ? ?Muscle weakness (generalized) ? ?Pain in right leg ? ?PHYSICAL THERAPY DISCHARGE SUMMARY ? ?Visits from Start of Care: 12 ? ?Current functional level related to goals / functional outcomes: ?See clinical impressions above ?  ?Remaining deficits: ?As above ?  ?Education / Equipment: ?HEP and self care  ? ?Patient agrees to discharge. Patient goals were met. Patient is being discharged due to meeting the stated rehab goals.  ? ? ?Problem List ?Patient Active Problem List  ? Diagnosis Date Noted  ? Chronic right-sided low back pain with right-sided sciatica 08/23/2021  ? Educated about COVID-19 virus infection 04/11/2020  ? Murmur 04/11/2020  ? Abnormal stress test 08/03/2015  ? Tenosynovitis, de Quervain 06/03/2014  ? Plantar fasciitis 06/03/2014  ? Type 2 diabetes mellitus without complication, without long-term current use of insulin ([place]) 04/05/2010  ? UNS ADVRS EFF OTH RX MEDICINAL&BIOLOGICAL SBSTNC 04/05/2010  ? INSOMNIA 03/09/2009  ? Anxiety state 03/30/2008  ? HYPERTENSION, BENIGN ESSENTIAL 02/18/2008  ? GERD 02/18/2008  ? Arthropathy 02/18/2008  ? OSA (obstructive sleep apnea) 02/18/2008  ? ?Ruben Im, PT ?11/07/21 4:12 PM ?Phone: [phone] ?Fax: [phone]  ?Alvera Singh, PT ?11/07/2021, 4:11 PM ? ? ?[facility] ?[place], [place], [address] ?Phone: [phone]   Fax:  [phone] ? ?Name:  Justin Herrera ?MRN: [id number] ?Date of Birth: [birth year] ? ? ? ?

## 2021-11-09 ENCOUNTER — Other Ambulatory Visit: Payer: Self-pay | Admitting: Family Medicine

## 2021-11-15 DIAGNOSIS — H903 Sensorineural hearing loss, bilateral: Secondary | ICD-10-CM | POA: Diagnosis not present

## 2021-11-22 ENCOUNTER — Other Ambulatory Visit: Payer: Self-pay | Admitting: Family Medicine

## 2021-11-30 ENCOUNTER — Telehealth: Payer: Self-pay | Admitting: Family Medicine

## 2021-11-30 NOTE — Telephone Encounter (Signed)
Spoke with patient to  schedule Medicare Annual Wellness Visit (AWV) either virtually or in office. ? ?Pt didn't want to schedule at this time.  Call back later ? ?AWV-I per [place] 11/25/17 ?please schedule at anytime with [facility] Nurse Health Advisor 1 or 2 ? ? ?This should be a 45 minute visit.  ?

## 2021-12-06 DIAGNOSIS — H903 Sensorineural hearing loss, bilateral: Secondary | ICD-10-CM | POA: Diagnosis not present

## 2022-01-09 ENCOUNTER — Other Ambulatory Visit: Payer: Self-pay | Admitting: Family Medicine

## 2022-01-26 ENCOUNTER — Telehealth: Payer: Self-pay | Admitting: Family Medicine

## 2022-01-26 NOTE — Telephone Encounter (Signed)
Spoke with patient to schedule AWV.  He was in [place] on vacation and wanted a call back end of june

## 2022-02-19 ENCOUNTER — Telehealth: Payer: Self-pay | Admitting: Family Medicine

## 2022-02-19 NOTE — Telephone Encounter (Signed)
Left message for patient to call back and schedule Medicare Annual Wellness Visit (AWV) either virtually or in office. Left  my jabber number [phone]    AWV-I per [place] 11/25/17 ; please schedule at anytime with [facility] Nurse Health Advisor 1 or 2   This should be a 45 minute visit.  

## 2022-03-10 ENCOUNTER — Other Ambulatory Visit: Payer: Self-pay | Admitting: Family Medicine

## 2022-03-11 ENCOUNTER — Other Ambulatory Visit: Payer: Self-pay | Admitting: Family Medicine

## 2022-03-13 NOTE — Telephone Encounter (Signed)
Last refill-08/23/21--30 capsules, 5 refill Last OV physical- 08/23/21  No future OV scheduled.

## 2022-03-21 DIAGNOSIS — L570 Actinic keratosis: Secondary | ICD-10-CM | POA: Diagnosis not present

## 2022-03-21 DIAGNOSIS — D1801 Hemangioma of skin and subcutaneous tissue: Secondary | ICD-10-CM | POA: Diagnosis not present

## 2022-03-21 DIAGNOSIS — L821 Other seborrheic keratosis: Secondary | ICD-10-CM | POA: Diagnosis not present

## 2022-03-21 DIAGNOSIS — L814 Other melanin hyperpigmentation: Secondary | ICD-10-CM | POA: Diagnosis not present

## 2022-04-03 ENCOUNTER — Telehealth: Payer: Self-pay | Admitting: Family Medicine

## 2022-04-03 NOTE — Telephone Encounter (Signed)
Left message for patient to call back and schedule Medicare Annual Wellness Visit (AWV) either virtually or in office. Left  my Herbie Drape number [phone]    AWV-I per [place] 11/25/17 ; please schedule at anytime with [facility] Nurse Health Advisor 1 or 2   This should be a 45 minute visit.

## 2022-04-19 ENCOUNTER — Other Ambulatory Visit (INDEPENDENT_AMBULATORY_CARE_PROVIDER_SITE_OTHER): Payer: Medicare PPO

## 2022-04-19 DIAGNOSIS — N289 Disorder of kidney and ureter, unspecified: Secondary | ICD-10-CM | POA: Diagnosis not present

## 2022-04-19 LAB — BASIC METABOLIC PANEL
BUN: 38 mg/dL — ABNORMAL HIGH (ref 6–23)
CO2: 27 mEq/L (ref 19–32)
Calcium: 9.9 mg/dL (ref 8.4–10.5)
Chloride: 101 mEq/L (ref 96–112)
Creatinine, Ser: 1.81 mg/dL — ABNORMAL HIGH (ref 0.40–1.50)
GFR: 37.46 mL/min — ABNORMAL LOW (ref >60.00)
Glucose, Bld: 135 mg/dL — ABNORMAL HIGH (ref 70–99)
Potassium: 4.3 mEq/L (ref 3.5–5.1)
Sodium: 137 mEq/L (ref 135–145)

## 2022-04-19 IMAGING — DX DG LUMBAR SPINE COMPLETE 4+V
5 series · 5 of 5 positions shown · non-contrast
Comparison: None.

CLINICAL DATA: low back pain that radiates to the right leg

EXAM:
LUMBAR SPINE - COMPLETE 4+ VIEW

[lumbar spine ap]
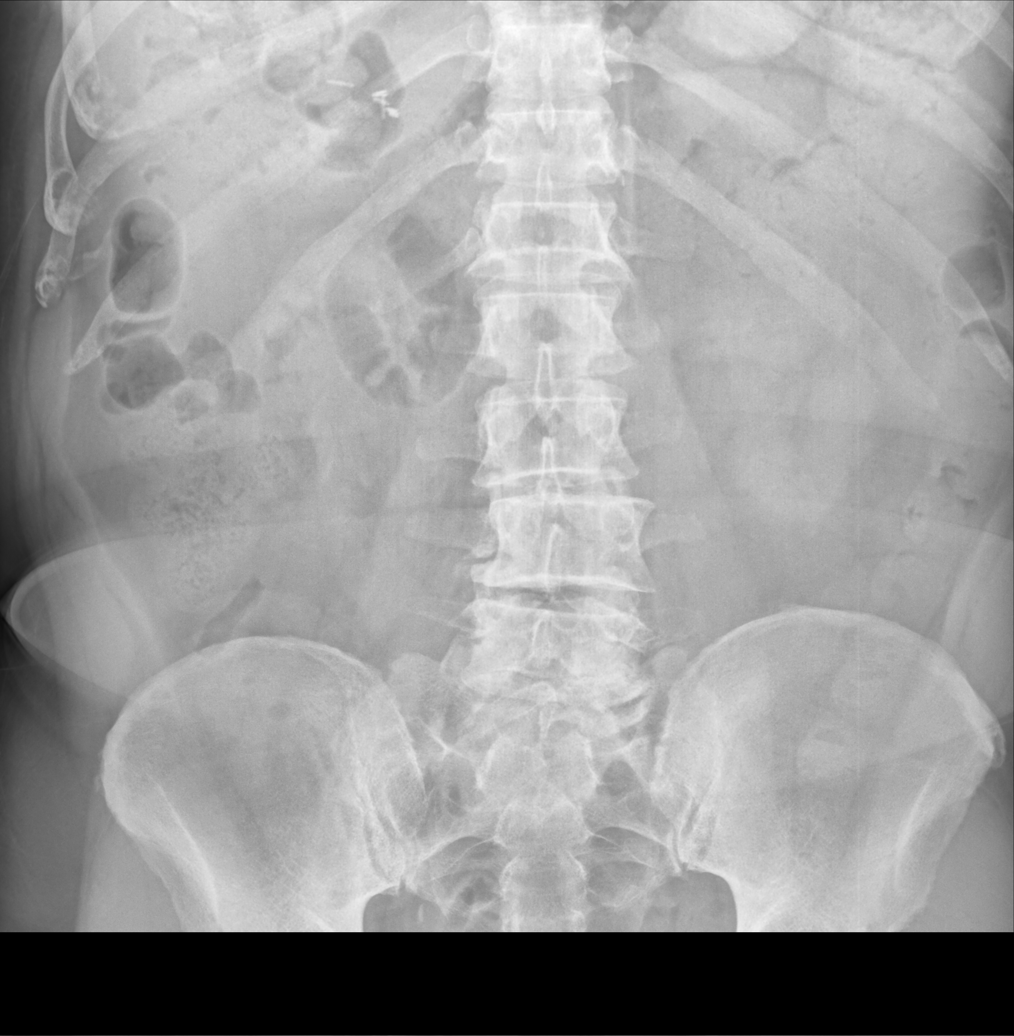

[lumbar spine oblique (1 of 2)]
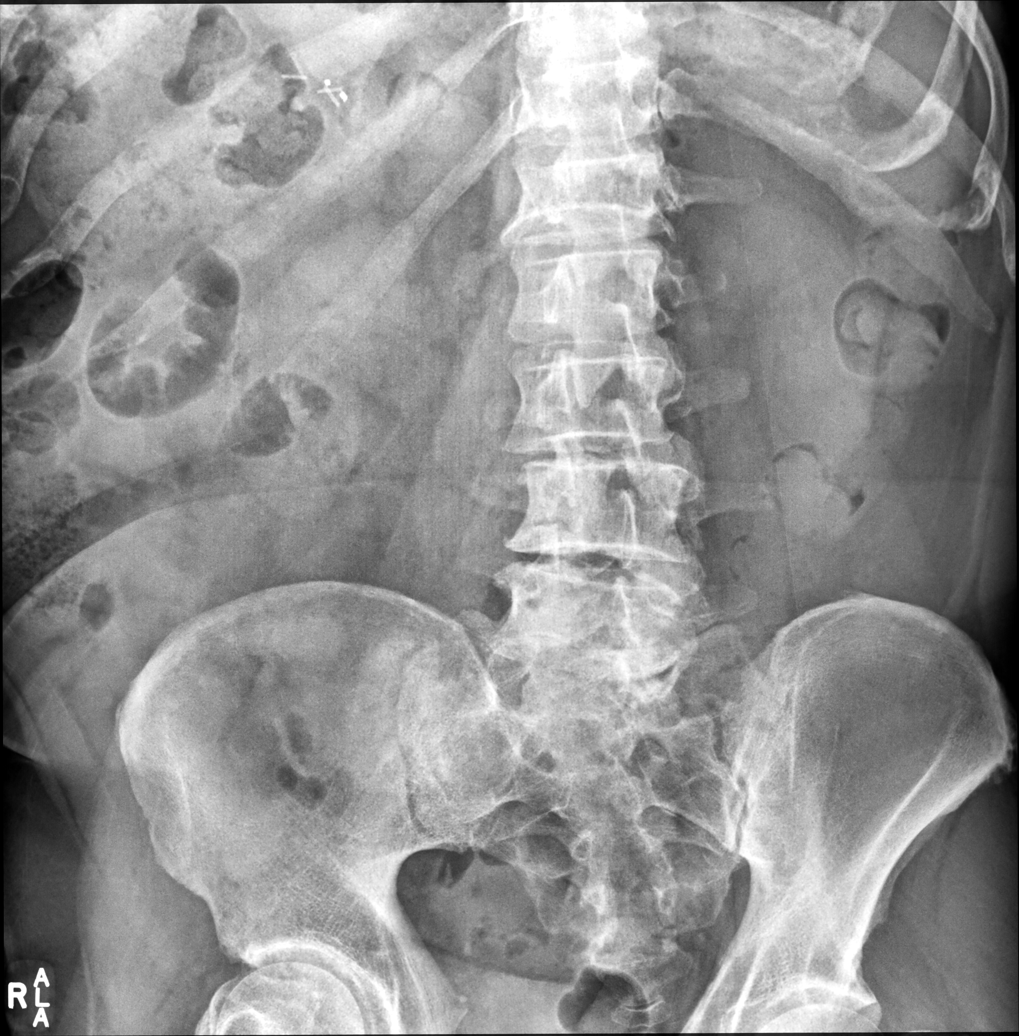

[lumbar spine oblique (2 of 2)]
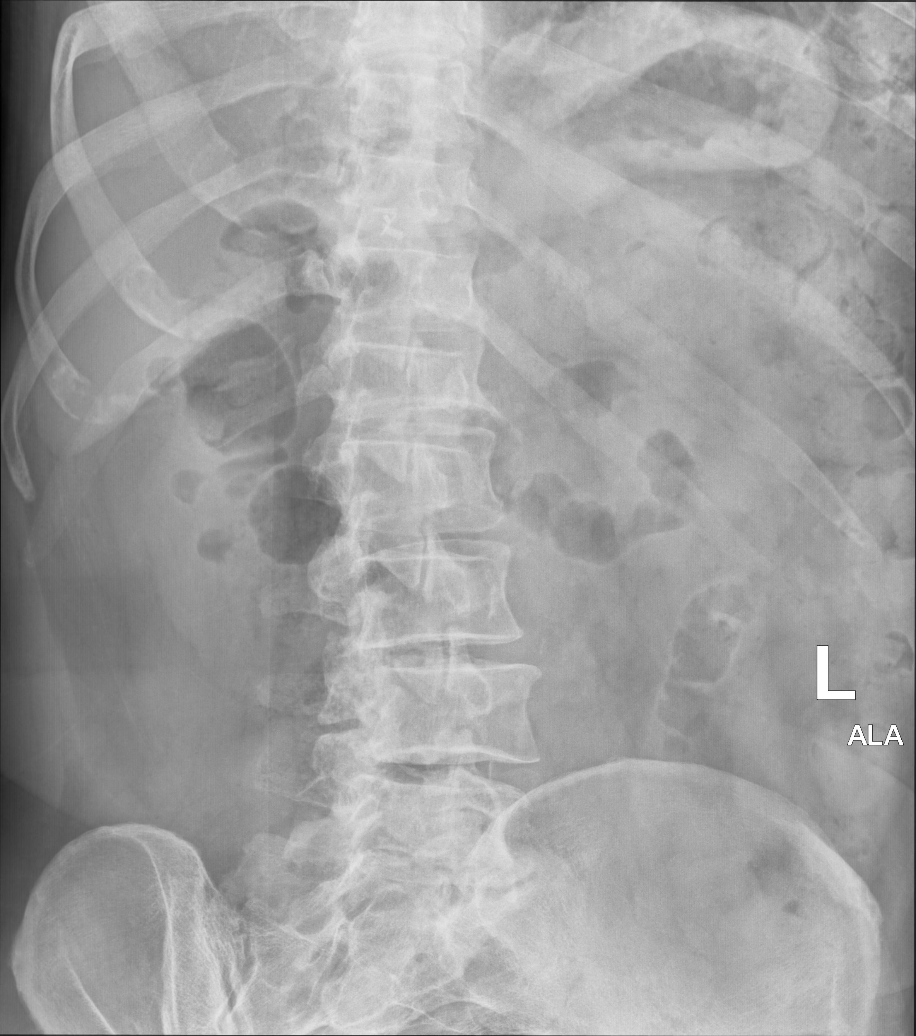

[lumbar spine lat (1 of 2)]
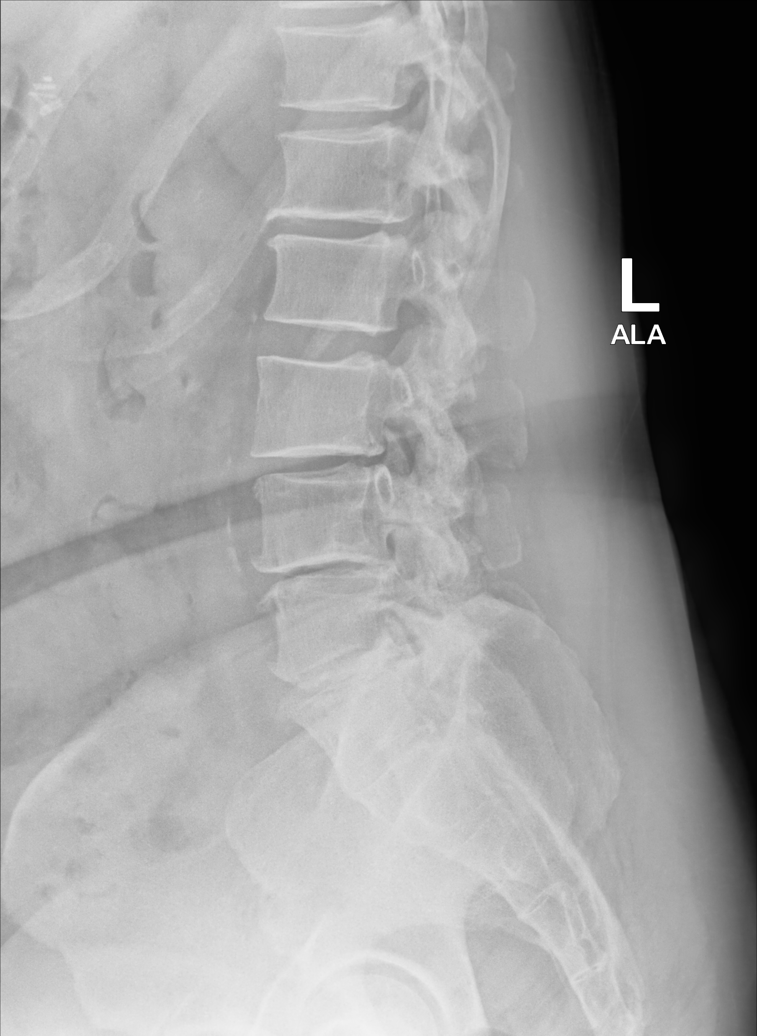

[lumbar spine lat (2 of 2)]
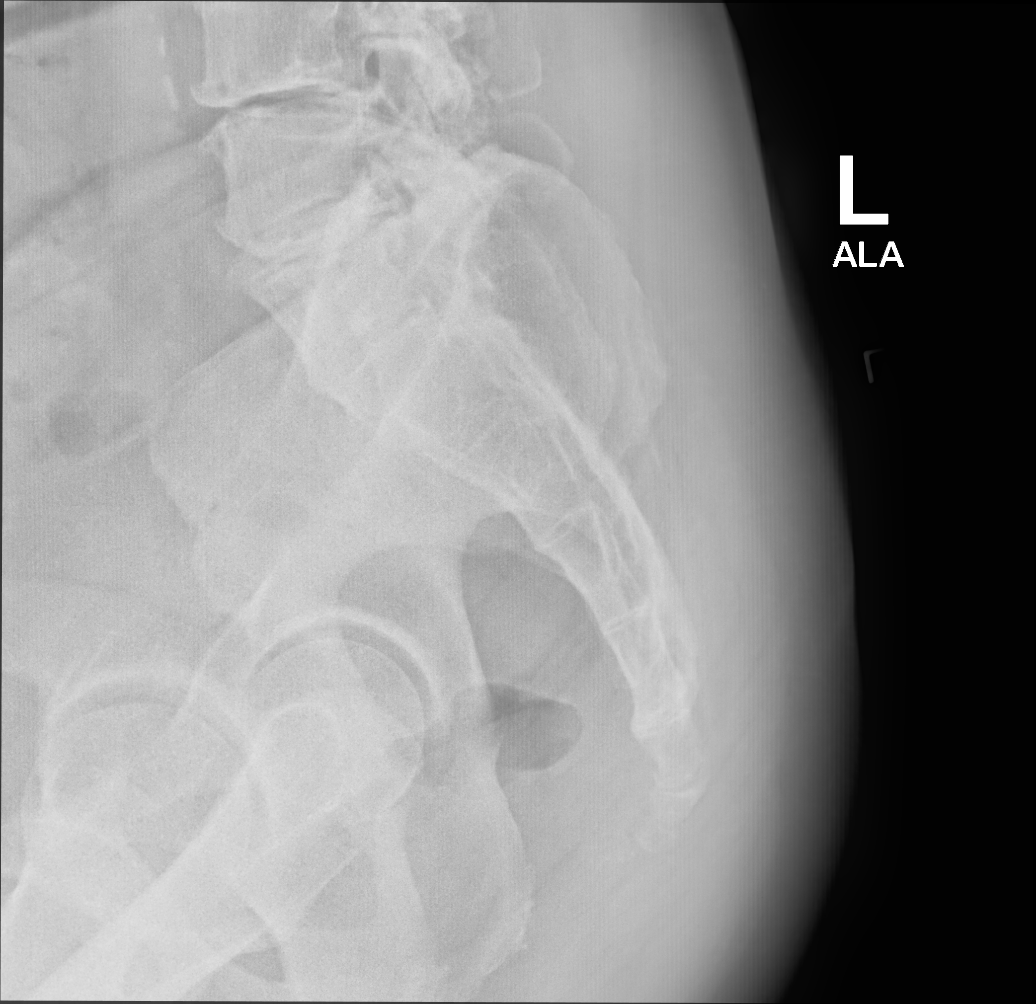

[5 of 5 positions shown; findings below may reference images not displayed]

FINDINGS: This report assumes 5 non rib-bearing lumbar vertebrae. Diminutive
T12 ribs. Transitional L5 level.

Lumbar vertebral body heights are preserved, with no fracture.

Moderate to marked multilevel lumbar degenerative disc disease, most
prominent at L3-4 and L4-5. Minimal 2 mm anterolisthesis at L2-3.
Mild 3 mm anterolisthesis at L3-4. Mild bilateral lower lumbar facet
arthropathy. No aggressive appearing focal osseous lesions.
Abdominal aortic atherosclerosis.
IMPRESSION: 1. Moderate to marked multilevel lumbar degenerative disc disease,
most prominent at L3-4 and L4-5.
2. Mild multilevel lower lumbar spondylolisthesis.
3. Mild bilateral lower lumbar facet arthropathy.

## 2022-04-20 NOTE — Addendum Note (Signed)
Addended by: Alysia Penna A on: 04/20/2022 08:09 AM   Modules accepted: Orders

## 2022-05-02 ENCOUNTER — Telehealth: Payer: Self-pay | Admitting: Family Medicine

## 2022-05-02 NOTE — Telephone Encounter (Signed)
Left message for patient to call back and schedule Medicare Annual Wellness Visit (AWV) either virtually or in office. Left  my Justin Herrera number [phone]   Also sent my chart message  AWV-I per [place] 11/25/17  please schedule at anytime with [facility] Nurse Health Advisor 1 or 2   This should be a 45 minute visit.

## 2022-05-07 ENCOUNTER — Encounter: Payer: Self-pay | Admitting: Family Medicine

## 2022-05-07 ENCOUNTER — Ambulatory Visit: Payer: Medicare PPO | Admitting: Family Medicine

## 2022-05-07 VITALS — BP 112/68 | HR 50 | Temp 98.1°F | Wt 220.0 lb

## 2022-05-07 DIAGNOSIS — M5441 Lumbago with sciatica, right side: Secondary | ICD-10-CM | POA: Diagnosis not present

## 2022-05-07 DIAGNOSIS — R2232 Localized swelling, mass and lump, left upper limb: Secondary | ICD-10-CM | POA: Insufficient documentation

## 2022-05-07 DIAGNOSIS — M67432 Ganglion, left wrist: Secondary | ICD-10-CM | POA: Insufficient documentation

## 2022-05-07 DIAGNOSIS — G8929 Other chronic pain: Secondary | ICD-10-CM

## 2022-05-07 NOTE — Progress Notes (Signed)
   Subjective:    Patient ID: Justin Herrera, male    DOB: 24-Aug-1951, 71 y.o.   MRN: [id number]  HPI Here for several issues. First he continues to have daily low back pain that radiates down the right leg. We discussed this last December, and he had Xrays showing degenerative discs at multiple levels as well as facet arthropathy. He also went through PT, but he says the pain has not improved at all. No numbness or weakness in the legs. He takes Diclofenac and Flexeril regularly. Also he asks [place] to check some small lumps in the left palm that have appeared over the past year. They are not painful. He also has a lump on the left wrist that has come and gone a few times over the past year. It is not painful either.   Review of Systems  Constitutional: Negative.   Respiratory: Negative.    Cardiovascular: Negative.   Musculoskeletal:  Positive for back pain.       Objective:   Physical Exam Constitutional:      Appearance: Normal appearance.  Cardiovascular:     Rate and Rhythm: Normal rate and regular rhythm.     Pulses: Normal pulses.     Heart sounds: Normal heart sounds.  Pulmonary:     Effort: Pulmonary effort is normal.     Breath sounds: Normal breath sounds.  Musculoskeletal:     Comments: He is tender over the lower back with full ROM. There are 3 small firm non-mobile non-tender masses in the left palm. There is also a firm, mobile, non-tender 1.5 cm lump on the flexor side of the left wrist. ROM is full.   Neurological:     Mental Status: He is alert.           Assessment & Plan:  He has low back pain with right sided sciatica. We will set up a non-contrasted MRI of the lumbar spine to investigate. He also has asymptomatic palmar nodules and a large ganglion cyst on the left hand and wrist. We agreed to simply observe these for now.  Alysia Penna, MD

## 2022-05-13 DIAGNOSIS — E663 Overweight: Secondary | ICD-10-CM | POA: Insufficient documentation

## 2022-05-13 NOTE — Progress Notes (Unsigned)
Cardiology Office Note   Date:  05/14/2022   ID:  Justin Herrera, DOB 08-20-1951, MRN [id number]  PCP:  Laurey Morale, MD  Cardiologist:   Minus Breeding, MD   Chief Complaint  Patient presents with   PVCs    History of Present Illness: Justin Herrera is a 71 y.o. male who presents for follow up of an abnormal stress perfusion study in 2016 which suggested that his EF was perhaps about 48%. There was a medium defect of moderate severity in the basal inferior, basal inferolateral and inferolateral location with question peri-infarct ischemia. The patient was able to walk 8 minutes and it took a while to get his heart rate above 85% on the stress test.  He said he didn't have any symptoms.   In December of that year I sent him for an echocardiogram.  There was slight LV thickening but nothing that was clinically concerning. The LV did not show any evidence of a previous inferior infarct.   I managed him medically.    Since I last saw him he has retired.  He is enjoying this. The patient denies any new symptoms such as chest discomfort, neck or arm discomfort. There has been no new shortness of breath, PND or orthopnea. There have been no reported palpitations, presyncope or syncope.  He does not notice the PVCs demonstrated below.  He is walking for exercise.  Past Medical History:  Diagnosis Date   Anxiety    on meds   Arthritis    generalized   Depression    on meds   Diabetes mellitus    Type II-on meds   GERD (gastroesophageal reflux disease)    Heart murmur    slight murmur noted per pt   Hypertension    on meds   Insomnia    Sleep apnea, obstructive    wears a CPAP   Symptomatic cholelithiasis     Past Surgical History:  Procedure Laterality Date   CHOLECYSTECTOMY N/A 03/28/2018   Procedure: LAPAROSCOPIC CHOLECYSTECTOMY WITH INTRAOPERATIVE CHOLANGIOGRAM;  Surgeon: [name], MD;  Location: [facility];  Service: General;  Laterality: N/A;   COLONOSCOPY   11/20/2013   per Dr. Fuller Plan, adenomatous polyps x 2, repeat in 5 yrs  (movi(prep good)   CYSTECTOMY Right 2014   right wrist   KNEE SURGERY  1968   RETINAL TEAR REPAIR CRYOTHERAPY       Current Outpatient Medications  Medication Sig Dispense Refill   acetaminophen (TYLENOL) 500 MG tablet Take 1,000 mg by mouth daily as needed for moderate pain or headache.     amLODipine-olmesartan (AZOR) 10-40 MG tablet Take 1 tablet by mouth daily. 90 tablet 3   aspirin 81 MG tablet Take 81 mg by mouth daily.     atorvastatin (LIPITOR) 20 MG tablet Take 1 tablet (20 mg total) by mouth daily. 90 tablet 3   cyclobenzaprine (FLEXERIL) 10 MG tablet Take 1 tablet (10 mg total) by mouth 3 (three) times daily as needed for muscle spasms. 60 tablet 5   diclofenac (VOLTAREN) 75 MG EC tablet TAKE 1 TABLET(75 MG) BY MOUTH TWICE DAILY 180 tablet 3   esomeprazole (NEXIUM) 40 MG capsule TAKE 1 CAPSULE(40 MG) BY MOUTH DAILY 90 capsule 3   glipiZIDE (GLUCOTROL) 10 MG tablet TAKE 1 TABLET(10 MG) BY MOUTH TWICE DAILY BEFORE A MEAL 180 tablet 0   glucose blood (ONE TOUCH ULTRA TEST) test strip Once a day 100 each 2  hydrochlorothiazide (HYDRODIURIL) 25 MG tablet TAKE 1 TABLET(25 MG) BY MOUTH DAILY 90 tablet 1   Lancets (ONETOUCH ULTRASOFT) lancets Once a day 100 each 2   metFORMIN (GLUCOPHAGE) 500 MG tablet TAKE 1 TABLET(500 MG) BY MOUTH TWICE DAILY WITH A MEAL 180 tablet 3   metoprolol succinate (TOPROL-XL) 100 MG 24 hr tablet Take 1 tablet (100 mg total) by mouth daily. Take with or immediately following a meal. 90 tablet 3   sertraline (ZOLOFT) 50 MG tablet TAKE 1 TABLET(50 MG) BY MOUTH DAILY 30 tablet 2   temazepam (RESTORIL) 30 MG capsule TAKE 1 CAPSULE(30 MG) BY MOUTH AT BEDTIME 30 capsule 5   No current facility-administered medications for this visit.    Allergies:   Patient has no known allergies.    ROS:  Please see the history of present illness.   Otherwise, review of systems are positive for none.   All  other systems are reviewed and negative.    PHYSICAL EXAM: VS:  BP 138/62   Pulse 71   Ht '5\' 11"'$  (1.803 m)   Wt 225 lb 9.6 oz (102.3 kg)   BMI 31.46 kg/m  , BMI Body mass index is 31.46 kg/m.  GENERAL:  Well appearing NECK:  No jugular venous distention, waveform within normal limits, carotid upstroke brisk and symmetric, no bruits, no thyromegaly LUNGS:  Clear to auscultation bilaterally CHEST:  Unremarkable HEART:  PMI not displaced or sustained,S1 and S2 within normal limits, no S3, no S4, no clicks, no rubs, soft apical systolic murmur, no diastolic murmurs ABD:  Flat, positive bowel sounds normal in frequency in pitch, no bruits, no rebound, no guarding, no midline pulsatile mass, no hepatomegaly, no splenomegaly EXT:  2 plus pulses throughout, no edema, no cyanosis no clubbing    EKG:  EKG is  ordered today. The ekg ordered  demonstrates sinus rhythm, rate 71, early transition in lead V2, no acute ST-T wave changes PVCs in a bigeminal pattern   Recent Labs: 08/23/2021: ALT 24; Hemoglobin 14.5; Platelets 221.0; TSH 1.69 04/19/2022: BUN 38; Creatinine, Ser 1.81; Potassium 4.3; Sodium 137    Lipid Panel    Component Value Date/Time   CHOL 168 08/23/2021 1612   TRIG 277.0 (H) 08/23/2021 1612   HDL 30.00 (L) 08/23/2021 1612   CHOLHDL 6 08/23/2021 1612   VLDL 55.4 (H) 08/23/2021 1612   LDLCALC 96 08/17/2019 1525   LDLDIRECT 111.0 08/23/2021 1612     Lab Results  Component Value Date   HGBA1C 6.8 (H) 08/23/2021    Wt Readings from Last 3 Encounters:  05/14/22 225 lb 9.6 oz (102.3 kg)  05/07/22 220 lb (99.8 kg)  09/04/21 220 lb (99.8 kg)      Other studies Reviewed: Additional studies/ records that were reviewed today include: Labs Review of the above records demonstrates: See elsewhere   ASSESSMENT AND PLAN:  ABNORMAL STRESS TEST: He has no new symptoms related to this.  He will continue with aggressive risk reduction.  MURMUR:     His exam is not changed.   He did have some slight basal hypertrophy which might of been causing Sam.  I will continue to follow this clinically.   HTN: His blood pressure is mildly elevated today but at target at home and on every other reading.  No change in therapy.   PVCs: He does not feel these.  No change in therapy.  He had normal electrolytes.    DYSLIPIDEMIA: LDL was 111 direct.  Triglycerides 227.  We talked about a Mediterranean plant-based diet and I am going to give him Lipitor 20 mg p.o. daily with a plan for repeat lipid in 3 months.  Current medicines are reviewed at length with the patient today.  The patient does not have concerns regarding medicines.  The following changes have been made: As above  Labs/ tests ordered today include:    Orders Placed This Encounter  Procedures   Lipid panel   EKG 12-Lead     Disposition:   FU with me in 12 months.     Signed, Minus Breeding, MD  05/14/2022 10:22 AM    [place]

## 2022-05-14 ENCOUNTER — Ambulatory Visit: Payer: Medicare PPO | Attending: Cardiology | Admitting: Cardiology

## 2022-05-14 ENCOUNTER — Encounter: Payer: Self-pay | Admitting: Cardiology

## 2022-05-14 VITALS — BP 138/62 | HR 71 | Ht 71.0 in | Wt 225.6 lb

## 2022-05-14 DIAGNOSIS — E785 Hyperlipidemia, unspecified: Secondary | ICD-10-CM

## 2022-05-14 DIAGNOSIS — R9439 Abnormal result of other cardiovascular function study: Secondary | ICD-10-CM

## 2022-05-14 DIAGNOSIS — R011 Cardiac murmur, unspecified: Secondary | ICD-10-CM

## 2022-05-14 DIAGNOSIS — I10 Essential (primary) hypertension: Secondary | ICD-10-CM | POA: Diagnosis not present

## 2022-05-14 DIAGNOSIS — E663 Overweight: Secondary | ICD-10-CM

## 2022-05-14 MED ORDER — ATORVASTATIN CALCIUM 20 MG PO TABS: 20.0000 mg | ORAL_TABLET | Freq: Every day | ORAL | 3 refills | Status: DC

## 2022-05-14 NOTE — Patient Instructions (Signed)
Medication Instructions:  START Lipitor 20 mg daily   *If you need a refill on your cardiac medications before your next appointment, please call your pharmacy*   Lab Work: LIPID (3 months, no lab appointment needed- nothing to eat or drink)  If you have labs (blood work) drawn today and your tests are completely normal, you will receive your results only by: [place] (if you have MyChart) OR A paper copy in the mail If you have any lab test that is abnormal or we need to change your treatment, we will call you to review the results.   Follow-Up: At [facility], you and your health needs are our priority.  As part of our continuing mission to provide you with exceptional heart care, we have created designated Provider Care Teams.  These Care Teams include your primary Cardiologist (physician) and Advanced Practice Providers (APPs -  Physician Assistants and Nurse Practitioners) who all work together to provide you with the care you need, when you need it.  We recommend signing up for the patient portal called "MyChart".  Sign up information is provided on this After Visit Summary.  MyChart is used to connect with patients for Virtual Visits (Telemedicine).  Patients are able to view lab/test results, encounter notes, upcoming appointments, etc.  Non-urgent messages can be sent to your provider as well.   To learn more about what you can do with MyChart, go to [email or web address].    Your next appointment:   12 month(s)  The format for your next appointment:   In Person  Provider:   Minus Breeding, MD

## 2022-05-18 ENCOUNTER — Telehealth: Payer: Self-pay | Admitting: Family Medicine

## 2022-05-18 DIAGNOSIS — Z209 Contact with and (suspected) exposure to unspecified communicable disease: Secondary | ICD-10-CM

## 2022-05-18 NOTE — Telephone Encounter (Signed)
Last OV-05/07/22.   Please advise

## 2022-05-18 NOTE — Addendum Note (Signed)
Addended by: Alysia Penna A on: 05/18/2022 01:07 PM   Modules accepted: Orders

## 2022-05-18 NOTE — Telephone Encounter (Signed)
Spoke with pt wife state that pt can come for lab ( Hep C screening) on 05/30/2022 when he is scheduled for his flu vaccine

## 2022-05-18 NOTE — Telephone Encounter (Signed)
Pt requesting hep c screening, says it was discussed at last appointment

## 2022-05-18 NOTE — Telephone Encounter (Signed)
I put in the order

## 2022-05-23 ENCOUNTER — Ambulatory Visit
Admission: RE | Admit: 2022-05-23 | Discharge: 2022-05-23 | Disposition: A | Discharge status: Home / Self Care | Payer: Medicare PPO | Source: Ambulatory Visit | Attending: Family Medicine | Admitting: Family Medicine

## 2022-05-23 DIAGNOSIS — G8929 Other chronic pain: Secondary | ICD-10-CM

## 2022-05-23 DIAGNOSIS — M48061 Spinal stenosis, lumbar region without neurogenic claudication: Secondary | ICD-10-CM | POA: Diagnosis not present

## 2022-05-23 DIAGNOSIS — M545 Low back pain, unspecified: Secondary | ICD-10-CM | POA: Diagnosis not present

## 2022-05-25 ENCOUNTER — Other Ambulatory Visit: Payer: Self-pay | Admitting: Family Medicine

## 2022-05-25 NOTE — Addendum Note (Signed)
Addended by: Alysia Penna A on: 05/25/2022 04:06 PM   Modules accepted: Orders

## 2022-05-30 ENCOUNTER — Ambulatory Visit (INDEPENDENT_AMBULATORY_CARE_PROVIDER_SITE_OTHER): Payer: Medicare PPO

## 2022-05-30 ENCOUNTER — Other Ambulatory Visit: Payer: Medicare PPO

## 2022-05-30 DIAGNOSIS — Z23 Encounter for immunization: Secondary | ICD-10-CM | POA: Diagnosis not present

## 2022-05-30 DIAGNOSIS — Z209 Contact with and (suspected) exposure to unspecified communicable disease: Secondary | ICD-10-CM

## 2022-05-31 LAB — HEPATITIS C ANTIBODY: Hepatitis C Ab: NONREACTIVE

## 2022-06-06 DIAGNOSIS — M5416 Radiculopathy, lumbar region: Secondary | ICD-10-CM | POA: Diagnosis not present

## 2022-06-06 DIAGNOSIS — Z6831 Body mass index (BMI) 31.0-31.9, adult: Secondary | ICD-10-CM | POA: Diagnosis not present

## 2022-06-08 ENCOUNTER — Other Ambulatory Visit: Payer: Self-pay | Admitting: Family Medicine

## 2022-06-14 ENCOUNTER — Encounter: Payer: Self-pay | Admitting: Family Medicine

## 2022-06-15 NOTE — Telephone Encounter (Signed)
Although getting a second opinion on anything is not a bad idea, I do not think he needs one for this. I also think he needs a surgery based on the MRI scan. This will only get worse if he does not do it fairly soon

## 2022-06-18 DIAGNOSIS — I129 Hypertensive chronic kidney disease with stage 1 through stage 4 chronic kidney disease, or unspecified chronic kidney disease: Secondary | ICD-10-CM | POA: Diagnosis not present

## 2022-06-18 DIAGNOSIS — E1122 Type 2 diabetes mellitus with diabetic chronic kidney disease: Secondary | ICD-10-CM | POA: Diagnosis not present

## 2022-06-18 DIAGNOSIS — M1990 Unspecified osteoarthritis, unspecified site: Secondary | ICD-10-CM | POA: Diagnosis not present

## 2022-06-18 DIAGNOSIS — E785 Hyperlipidemia, unspecified: Secondary | ICD-10-CM | POA: Diagnosis not present

## 2022-06-18 DIAGNOSIS — N1832 Chronic kidney disease, stage 3b: Secondary | ICD-10-CM | POA: Diagnosis not present

## 2022-06-20 ENCOUNTER — Other Ambulatory Visit: Payer: Self-pay | Admitting: Neurological Surgery

## 2022-07-04 ENCOUNTER — Other Ambulatory Visit: Payer: Self-pay | Admitting: Family Medicine

## 2022-07-04 NOTE — Progress Notes (Signed)
Surgical Instructions    Your procedure is scheduled on Monday, 07/16/22.  Report to [facility] Main Entrance "A" at 5:30 A.M., then check in with the Admitting office.  Call this number if you have problems the morning of surgery:  [phone]   If you have any questions prior to your surgery date call [phone]: Open Monday-Friday 8am-4pm If you experience any cold or flu symptoms such as cough, fever, chills, shortness of breath, etc. between now and your scheduled surgery, please notify us at the above number     Remember:  Do not eat after midnight the night before your surgery  You may drink clear liquids until 4:30am the morning of your surgery.   Clear liquids allowed are: Water, Non-Citrus Juices (without pulp), Carbonated Beverages, Clear Tea, Black Coffee ONLY (NO MILK, CREAM OR POWDERED CREAMER of any kind), and Gatorade    Take these medicines the morning of surgery with A SIP OF WATER:  atorvastatin (LIPITOR)  esomeprazole (NEXIUM)  metoprolol succinate (TOPROL-XL)  sertraline (ZOLOFT)   IF NEEDED: acetaminophen (TYLENOL)  cyclobenzaprine (FLEXERIL)   As of today, STOP taking any Aspirin (unless otherwise instructed by your surgeon) Aleve, Naproxen, Ibuprofen, Motrin, Advil, Goody's, BC's, all herbal medications, fish oil, diclofenac (VOLTAREN) and all vitamins.  WHAT DO I DO ABOUT MY DIABETES MEDICATION?   Do not take oral diabetes medicines (pills) the morning of surgery.  THE NIGHT BEFORE SURGERY, do not take glipiZIDE (GLUCOTROL).      THE MORNING OF SURGERY, do not take glipiZIDE (GLUCOTROL) or metFORMIN (GLUCOPHAGE).  The day of surgery, do not take other diabetes injectables, including Byetta (exenatide), Bydureon (exenatide ER), Victoza (liraglutide), or Trulicity (dulaglutide).  If your CBG is greater than 220 mg/dL, you may take  of your sliding scale (correction) dose of insulin.   HOW TO MANAGE YOUR DIABETES BEFORE AND AFTER SURGERY  Why  is it important to control my blood sugar before and after surgery? Improving blood sugar levels before and after surgery helps healing and can limit problems. A way of improving blood sugar control is eating a healthy diet by:  Eating less sugar and carbohydrates  Increasing activity/exercise  Talking with your doctor about reaching your blood sugar goals High blood sugars (greater than 180 mg/dL) can raise your risk of infections and slow your recovery, so you will need to focus on controlling your diabetes during the weeks before surgery. Make sure that the doctor who takes care of your diabetes knows about your planned surgery including the date and location.  How do I manage my blood sugar before surgery? Check your blood sugar at least 4 times a day, starting 2 days before surgery, to make sure that the level is not too high or low.  Check your blood sugar the morning of your surgery when you wake up and every 2 hours until you get to the Short Stay unit.  If your blood sugar is less than 70 mg/dL, you will need to treat for low blood sugar: Do not take insulin. Treat a low blood sugar (less than 70 mg/dL) with  cup of clear juice (cranberry or apple), 4 glucose tablets, OR glucose gel. Recheck blood sugar in 15 minutes after treatment (to make sure it is greater than 70 mg/dL). If your blood sugar is not greater than 70 mg/dL on recheck, call [phone] for further instructions. Report your blood sugar to the short stay nurse when you get to Short Stay.  If you are admitted  to the hospital after surgery: Your blood sugar will be checked by the staff and you will probably be given insulin after surgery (instead of oral diabetes medicines) to make sure you have good blood sugar levels. The goal for blood sugar control after surgery is 80-180 mg/dL.           Do not wear jewelry or makeup. Do not wear lotions, powders, cologne or deodorant. Men may shave face and neck. Do not bring  valuables to the hospital. Do not wear nail polish, gel polish, artificial nails, or any other type of covering on natural nails (fingers and toes) If you have artificial nails or gel coating that need to be removed by a nail salon, please have this removed prior to surgery. Artificial nails or gel coating may interfere with anesthesia's ability to adequately monitor your vital signs.  [facility] is not responsible for any belongings or valuables.    Do NOT Smoke (Tobacco/Vaping)  24 hours prior to your procedure  If you use a CPAP at night, you may bring your mask for your overnight stay.   Contacts, glasses, hearing aids, dentures or partials may not be worn into surgery, please bring cases for these belongings   For patients admitted to the hospital, discharge time will be determined by your treatment team.   Patients discharged the day of surgery will not be allowed to drive home, and someone needs to stay with them for 24 hours.   SURGICAL WAITING ROOM VISITATION Patients having surgery or a procedure may have no more than 2 support people in the waiting area - these visitors may rotate.   Children under the age of 68 must have an adult with them who is not the patient. If the patient needs to stay at the hospital during part of their recovery, the visitor guidelines for inpatient rooms apply. Pre-op nurse will coordinate an appropriate time for 1 support person to accompany patient in pre-op.  This support person may not rotate.   Please refer to [email or web address] for the visitor guidelines for Inpatients (after your surgery is over and you are in a regular room).    Special instructions:    Oral Hygiene is also important to reduce your risk of infection.  Remember - BRUSH YOUR TEETH THE MORNING OF SURGERY WITH YOUR REGULAR TOOTHPASTE   [place]- Preparing For Surgery  Before surgery, you can play an important role.  Because skin is not sterile, your skin needs to be as free of germs as possible. You can reduce the number of germs on your skin by washing with CHG (chlorahexidine gluconate) Soap before surgery.  CHG is an antiseptic cleaner which kills germs and bonds with the skin to continue killing germs even after washing.     Please do not use if you have an allergy to CHG or antibacterial soaps. If your skin becomes reddened/irritated stop using the CHG.  Do not shave (including legs and underarms) for at least 48 hours prior to first CHG shower. It is OK to shave your face.  Please follow these instructions carefully.     Shower the NIGHT BEFORE SURGERY and the MORNING OF SURGERY with CHG Soap.   If you chose to wash your hair, wash your hair first as usual with your normal shampoo. After you shampoo, rinse your hair and body thoroughly to remove the shampoo.  Then [employer] and genitals (private parts) with your normal soap and rinse thoroughly to remove soap.  After that Use CHG Soap as you would any other liquid soap. You can apply CHG directly to the skin and wash gently with a scrungie or a clean washcloth.   Apply the CHG Soap to your body ONLY FROM THE NECK DOWN.  Do not use on open wounds or open sores. Avoid contact with your eyes, ears, mouth and genitals (private parts). Wash Face and genitals (private parts)  with your normal soap.   Wash thoroughly, paying special attention to the area where your surgery will be performed.  Thoroughly rinse your body with warm water from the neck down.  DO NOT shower/wash with your normal soap after using and rinsing off the CHG Soap.  Pat yourself dry with a CLEAN TOWEL.  Wear CLEAN PAJAMAS to bed the night before surgery  Place CLEAN SHEETS on your bed the night before your surgery  DO NOT SLEEP WITH PETS.   Day of Surgery: Take a shower with CHG soap. Wear Clean/Comfortable clothing the morning of surgery Do not apply any  deodorants/lotions.   Remember to brush your teeth WITH YOUR REGULAR TOOTHPASTE.    If you received a COVID test during your pre-op visit, it is requested that you wear a mask when out in public, stay away from anyone that may not be feeling well, and notify your surgeon if you develop symptoms. If you have been in contact with anyone that has tested positive in the last 10 days, please notify your surgeon.    Please read over the following fact sheets that you were given.

## 2022-07-05 ENCOUNTER — Encounter (HOSPITAL_COMMUNITY)
Admission: RE | Admit: 2022-07-05 | Discharge: 2022-07-05 | Disposition: A | Discharge status: Home / Self Care | Payer: Medicare PPO | Source: Ambulatory Visit | Attending: Neurological Surgery | Admitting: Neurological Surgery

## 2022-07-05 ENCOUNTER — Encounter (HOSPITAL_COMMUNITY): Payer: Self-pay

## 2022-07-05 ENCOUNTER — Other Ambulatory Visit: Payer: Self-pay

## 2022-07-05 VITALS — BP 123/83 | HR 65 | Temp 97.5°F | Resp 17 | Ht 71.0 in | Wt 225.0 lb

## 2022-07-05 DIAGNOSIS — Z01812 Encounter for preprocedural laboratory examination: Secondary | ICD-10-CM | POA: Diagnosis not present

## 2022-07-05 DIAGNOSIS — E119 Type 2 diabetes mellitus without complications: Secondary | ICD-10-CM | POA: Diagnosis not present

## 2022-07-05 DIAGNOSIS — Z01818 Encounter for other preprocedural examination: Secondary | ICD-10-CM

## 2022-07-05 HISTORY — DX: Headache, unspecified: R51.9

## 2022-07-05 LAB — CBC
HCT: 41.3 % (ref 39.0–52.0)
Hemoglobin: 14.5 g/dL (ref 13.0–17.0)
MCH: 30.8 pg (ref 26.0–34.0)
MCHC: 35.1 g/dL (ref 30.0–36.0)
MCV: 87.7 fL (ref 80.0–100.0)
Platelets: 223 10*3/uL (ref 150–400)
RBC: 4.71 MIL/uL (ref 4.22–5.81)
RDW: 12.6 % (ref 11.5–15.5)
WBC: 7.2 10*3/uL (ref 4.0–10.5)
nRBC: 0 % (ref 0.0–0.2)

## 2022-07-05 LAB — BASIC METABOLIC PANEL
Anion gap: 11 (ref 5–15)
BUN: 34 mg/dL — ABNORMAL HIGH (ref 8–23)
CO2: 24 mmol/L (ref 22–32)
Calcium: 9.9 mg/dL (ref 8.9–10.3)
Chloride: 100 mmol/L (ref 98–111)
Creatinine, Ser: 1.94 mg/dL — ABNORMAL HIGH (ref 0.61–1.24)
GFR, Estimated: 37 mL/min — ABNORMAL LOW (ref >60)
Glucose, Bld: 195 mg/dL — ABNORMAL HIGH (ref 70–99)
Potassium: 4.3 mmol/L (ref 3.5–5.1)
Sodium: 135 mmol/L (ref 135–145)

## 2022-07-05 LAB — TYPE AND SCREEN
ABO/RH(D): O POS
Antibody Screen: NEGATIVE

## 2022-07-05 LAB — HEMOGLOBIN A1C
Hgb A1c MFr Bld: 6.5 % — ABNORMAL HIGH (ref 4.8–5.6)
Mean Plasma Glucose: 139.85 mg/dL

## 2022-07-05 LAB — SURGICAL PCR SCREEN
MRSA, PCR: NEGATIVE
Staphylococcus aureus: NEGATIVE

## 2022-07-05 LAB — GLUCOSE, CAPILLARY: Glucose-Capillary: 202 mg/dL — ABNORMAL HIGH (ref 70–99)

## 2022-07-05 NOTE — Progress Notes (Addendum)
PCP - Dr. Alysia Penna Cardiologist - Dr. Minus Breeding  PPM/ICD - n/a Device Orders - n/a Rep Notified - n/a  Chest x-ray - n/a EKG - 05/14/22 Stress Test - 07/15/15 ECHO - 08/17/15 Cardiac Cath -   Sleep Study - +OSA.  CPAP - Wears CPAP nightly. Pressure setting is 7.   CBG today- 202. Patient states he has a bagel with peanut butter and unsweet tea today.  Checks Blood Sugar- Rarely. States he goes by his A1 C.  When he does check his blood sugar he states the range is 140-170.  Last dose of GLP1 agonist-   GLP1 instructions:  No Metformin the morning of surgery.  No Glipizide the evening before surgery or the morning of surgery.   Blood Thinner Instructions: n/a Aspirin Instructions: As of today stop taking Aspirin unless otherwise instructed by your surgeon. Patient states he was instructed to stop Aspirin 10 days prior to surgery.   ERAS Protcol - Clear liquids until 0430 day of surgery.  PRE-SURGERY Ensure or G2- None ordered. None given.   COVID TEST- n/a   Anesthesia review: Yes. Heart murmur. Sigmund Hazel, PA made aware. Per [place] patient was seen recently by Dr. Percival Spanish but she will probably ask the surgeons office to reach out to cardiology since the patient has a history of an abnormal stress test.   Patient denies shortness of breath, fever, cough and chest pain at PAT appointment   All instructions explained to the patient, with a verbal understanding of the material. Patient agrees to go over the instructions while at home for a better understanding. The opportunity to ask questions was provided.

## 2022-07-06 ENCOUNTER — Telehealth: Payer: Self-pay | Admitting: *Deleted

## 2022-07-06 NOTE — Telephone Encounter (Signed)
   Pre-operative Risk Assessment    Patient Name: Justin Herrera  DOB: 01-20-1951 MRN: [id number]      Request for Surgical Clearance    Procedure:   RIGHT L3-4 MINIMALLY INVASIVE DECOMPRESSION TRANS FORAMINAL LUMBAR INTERBODY FUSION  Date of Surgery:  Clearance 07/16/22                                 Surgeon:  DR. Emelda Brothers Surgeon's Group or Practice Name:  [facility] Phone number:  [phone] Fax number:  [phone] ATTN: JESSICA   Type of Clearance Requested:   - Medical ; ASA    Type of Anesthesia:  General    Additional requests/questions:    [name]   07/06/2022, 1:31 PM

## 2022-07-06 NOTE — Progress Notes (Signed)
Anesthesia Chart Review:  Case: [id number] Date/Time: 07/16/22 0715   Procedures:      RT L3-4 MIS Decompression/TLIF (Right) - RM 60/1U     Application of O-Arm   Anesthesia type: General   Pre-op diagnosis: Lumbar radiculopathy   Location: [facility] OR ROOM 21 / [facility]   Surgeons: Judith Part, MD       DISCUSSION: Patient is a 71 year old male scheduled for the above procedure.  History includes never smoker, HTN, murmur, PVCs, DM2, OSA (uses CPAP @ 7), GERD.  He was last evaluated by cardiologist Minus Breeding, MD on 05/14/22 for follow-up PVCs and history of prior abnormal stress perfusion study in 06/2015 suggesting EF 48% with medium defect of moderate severity in the basal inferior, basal inferolateral, and inferolateral regions with peri-infarct ischemia and suggesting prior myocardial infarction with peri-infarct ischemia. He was able to walk 8 minutes without symptoms and it took a while to get his heart rate above 85% on the stress test. He had an echocardiogram in 07/2015 which showed LVEF 55-60%, mild focal basal hypertrophy. There was no evidence of prior infarct on echo, so Dr. Percival Spanish has managed him medically. As of 05/14/22 evaluation, he was walking for exercise and was not having chest pain or new SOB. He was asymptomatic of his PVCs.  He has a known soft apical systolic murmur, possibly attributed to "slight basal hypertrophy which might of been causing Sam." Dr. Percival Spanish to continue to follow clinically.   I will continue to follow this clinically. Continue aggressive CV risk reduction.  12 month follow-up planned.   He had recent follow-up with cardiology and clinically appeared to be doing well. Since he now has lumbar fusion scheduled and has a remote history of abnormal stress testing and PVCs, I did request that Dr. Zada Finders ask for presurgical cardiology input.   He saw nephrologist Dr. Joylene Grapes as a new patient on 06/18/22 (scanned under Media tab). He notes, patient's  creatinine had been around 1.1-1.2 in 2021 but 1.81-1/82 (eGFR 37) 08/23/21-04/19/22, although down to 1.55, eGFR 48 on 06/18/22.  2019 abdominal ultrasound showed normal kidney size without masses or hydronephrosis. No proteinuria. Fluctuations in creatinine thought likely related to significant NSAID use and suggested he reduce use and if no improvement consider alternative medications. 07/05/22 Creatinine 1.94 on 07/05/22, eGFR 37, consistent with his labs in August. CBC normal. A1c 6.5%, on metformin and glipizide.   Chart will be left for follow-up cardiology preoperative input.  He reported instructions to hold aspirin 10 days prior to surgery.   VS: BP 123/83   Pulse 65   Temp (!) 36.4 C   Resp 17   Ht _0  (1.803 m)   Wt 102.1 kg   SpO2 99%   BMI 31.38 kg/m    PROVIDERS: Laurey Morale, MD is PCP  Minus Breeding, MD is cardiologist  Santiago Bumpers, MD is nephrologist   LABS: Preoperative labs noted. See DISCUSSION. (all labs ordered are listed, but only abnormal results are displayed)  Labs Reviewed  HEMOGLOBIN A1C - Abnormal; Notable for the following components:      Result Value   Hgb A1c MFr Bld 6.5 (*)    All other components within normal limits  BASIC METABOLIC PANEL - Abnormal; Notable for the following components:   Glucose, Bld 195 (*)    BUN 34 (*)    Creatinine, Ser 1.94 (*)    GFR, Estimated 37 (*)    All other components within normal  limits  GLUCOSE, CAPILLARY - Abnormal; Notable for the following components:   Glucose-Capillary 202 (*)    All other components within normal limits  SURGICAL PCR SCREEN  CBC  TYPE AND SCREEN     IMAGES: MRI L-spine 05/23/22: IMPRESSION: 1. Advanced disc and facet degeneration at L3-L4 through L5-S1 resulting in severe spinal canal stenosis at L3-L4 and moderate to severe spinal canal stenosis at L4-L5 with compression of the traversing cauda equina nerve roots. 2. Severe neural foraminal stenosis on the right  at L3-L4 and L4-L5, and on the left at L5-S1. Specifically, on the right at L4-L5, there is a prominent foraminal/extraforaminal protrusion/extrusion which impinges the exiting L4 nerve root. 3. Facet arthropathy most advanced at L4-L5 with right larger than left effusions.    EKG: 05/14/22 ([facility]): Sinus rhythm with frequent premature ventricular complexes (with bigeminy PVCs).  Left atrial hypertrophy with QRS widening.   CV: Echo 08/17/15: Study Conclusions  - Left ventricle: The cavity size was normal. There was mild focal    basal hypertrophy of the septum. Systolic function was normal.    The estimated ejection fraction was in the range of 55% to 60%.    Wall motion was normal; there were no regional wall motion    abnormalities. Doppler parameters are consistent with abnormal    left ventricular relaxation (grade 1 diastolic dysfunction).  - Mitral valve: There was mild systolic anterior motion of the    chordal structures.  Impressions:  - Normal LV systolic function; mild basal septal hypertrophy; mild    chordal SAM; grade 1 diastolic dysfunction.    Nuclear stress test 07/15/15: The left ventricular ejection fraction is mildly decreased (45-54%). Nuclear stress EF: 48%. Defect 1: There is a medium defect of moderate severity present in the basal inferior, basal inferolateral and mid inferolateral location. This is an intermediate risk study. Findings consistent with prior myocardial infarction with peri-infarct ischemia.      Past Medical History:  Diagnosis Date   Anxiety    on meds   Arthritis    generalized   Depression    on meds   Diabetes mellitus    Type II-on meds   GERD (gastroesophageal reflux disease)    Headache    Heart murmur    slight murmur noted per pt   Hypertension    on meds   Insomnia    Sleep apnea, obstructive    wears a CPAP   Symptomatic cholelithiasis     Past Surgical History:  Procedure Laterality Date    CHOLECYSTECTOMY N/A 03/28/2018   Procedure: LAPAROSCOPIC CHOLECYSTECTOMY WITH INTRAOPERATIVE CHOLANGIOGRAM;  Surgeon: [name], MD;  Location: [facility];  Service: General;  Laterality: N/A;   COLONOSCOPY  11/20/2013   per Dr. Fuller Plan, adenomatous polyps x 2, repeat in 5 yrs  (movi(prep good)   CYSTECTOMY Right 2014   right wrist   KNEE SURGERY  1968   RETINAL TEAR REPAIR CRYOTHERAPY      MEDICATIONS:  acetaminophen (TYLENOL) 500 MG tablet   amLODipine-olmesartan (AZOR) 10-40 MG tablet   aspirin 81 MG tablet   atorvastatin (LIPITOR) 20 MG tablet   Cholecalciferol (VITAMIN D-3) 125 MCG (5000 UT) TABS   cyanocobalamin (VITAMIN B12) 1000 MCG tablet   diclofenac (VOLTAREN) 75 MG EC tablet   esomeprazole (NEXIUM) 40 MG capsule   glipiZIDE (GLUCOTROL) 10 MG tablet   glucose blood (ONE TOUCH ULTRA TEST) test strip   hydrochlorothiazide (HYDRODIURIL) 25 MG tablet   Lancets (ONETOUCH ULTRASOFT) lancets  metFORMIN (GLUCOPHAGE) 500 MG tablet   metoprolol succinate (TOPROL-XL) 100 MG 24 hr tablet   sertraline (ZOLOFT) 50 MG tablet   Soft Lens Products (SENSITIVE EYES SALINE) SOLN   temazepam (RESTORIL) 30 MG capsule   No current facility-administered medications for this encounter.    Myra Gianotti, PA-C Surgical Short Stay/Anesthesiology [facility] Phone [phone] [facility] Phone [phone] 07/06/2022 5:20 PM

## 2022-07-06 NOTE — Telephone Encounter (Signed)
Left message for the pt to call back for tele pre op appt.  ?

## 2022-07-06 NOTE — Telephone Encounter (Signed)
Primary Cardiologist:James Hochrein, MD   Preoperative team, please contact this patient and set up a phone call appointment for further preoperative risk assessment. Please obtain consent and complete medication review. Thank you for your help.   From a cardiac perspective, he may hold aspirin for 5 to 7 days prior to procedure.   Emmaline Life, NP-C  07/06/2022, 2:18 PM [address], [address] Office [phone] Fax [phone]

## 2022-07-06 NOTE — Anesthesia Preprocedure Evaluation (Addendum)
Anesthesia Evaluation  Patient identified by MRN, date of birth, ID band Patient awake    Reviewed: Allergy & Precautions, NPO status , Patient's Chart, lab work & pertinent test results  Airway Mallampati: III       Dental  (+) Dental Advisory Given   Pulmonary sleep apnea and Continuous Positive Airway Pressure Ventilation    breath sounds clear to auscultation       Cardiovascular hypertension, Pt. on medications  Rhythm:Regular Rate:Normal     Neuro/Psych  Neuromuscular disease    GI/Hepatic Neg liver ROS,GERD  ,,  Endo/Other  negative endocrine ROSdiabetes, Type 2    Renal/GU CRFRenal disease     Musculoskeletal  (+) Arthritis ,    Abdominal   Peds  Hematology negative hematology ROS (+)   Anesthesia Other Findings   Reproductive/Obstetrics                              Lab Results  Component Value Date   WBC 7.2 07/05/2022   HGB 14.5 07/05/2022   HCT 41.3 07/05/2022   MCV 87.7 07/05/2022   PLT 223 07/05/2022   Lab Results  Component Value Date   CREATININE 1.94 (H) 07/05/2022   BUN 34 (H) 07/05/2022   NA 135 07/05/2022   K 4.3 07/05/2022   CL 100 07/05/2022   CO2 24 07/05/2022    Anesthesia Physical Anesthesia Plan  ASA: 3  Anesthesia Plan: General   Post-op Pain Management: Tylenol PO (pre-op)*   Induction: Intravenous  PONV Risk Score and Plan: 2 and Dexamethasone, Ondansetron and Treatment may vary due to age or medical condition  Airway Management Planned: Oral ETT  Additional Equipment: None  Intra-op Plan:   Post-operative Plan: Extubation in OR  Informed Consent: I have reviewed the patients History and Physical, chart, labs and discussed the procedure including the risks, benefits and alternatives for the proposed anesthesia with the patient or authorized representative who has indicated his/her understanding and acceptance.     Dental advisory  given  Plan Discussed with: CRNA  Anesthesia Plan Comments: (  )        Anesthesia Quick Evaluation

## 2022-07-08 ENCOUNTER — Other Ambulatory Visit: Payer: Self-pay | Admitting: Family Medicine

## 2022-07-08 ENCOUNTER — Other Ambulatory Visit: Payer: Self-pay | Admitting: Internal Medicine

## 2022-07-09 ENCOUNTER — Telehealth: Payer: Self-pay | Admitting: *Deleted

## 2022-07-09 NOTE — Telephone Encounter (Signed)
Pt agreeable to tele pre op appt 07/11/22 @ 3:20 due to procedure date and med hold. Pt tells me that he has already begun holding ASA as of 07/06/22. Med rec and consent are done.     Patient Consent for Virtual Visit        ALEXANDRA POSADAS has provided verbal consent on 07/09/2022 for a virtual visit (video or telephone).   CONSENT FOR VIRTUAL VISIT FOR:  [name]  By participating in this virtual visit I agree to the following:  I hereby voluntarily request, consent and authorize [facility] and its employed or contracted physicians, physician assistants, nurse practitioners or other licensed health care professionals (the Practitioner), to provide me with telemedicine health care services (the "Services") as deemed necessary by the treating Practitioner. I acknowledge and consent to receive the Services by the Practitioner via telemedicine. I understand that the telemedicine visit will involve communicating with the Practitioner through live audiovisual communication technology and the disclosure of certain medical information by electronic transmission. I acknowledge that I have been given the opportunity to request an in-person assessment or other available alternative prior to the telemedicine visit and am voluntarily participating in the telemedicine visit.  I understand that I have the right to withhold or withdraw my consent to the use of telemedicine in the course of my care at any time, without affecting my right to future care or treatment, and that the Practitioner or I may terminate the telemedicine visit at any time. I understand that I have the right to inspect all information obtained and/or recorded in the course of the telemedicine visit and may receive copies of available information for a reasonable fee.  I understand that some of the potential risks of receiving the Services via telemedicine include:  Delay or interruption in medical evaluation due to  technological equipment failure or disruption; Information transmitted may not be sufficient (e.g. poor resolution of images) to allow for appropriate medical decision making by the Practitioner; and/or  In rare instances, security protocols could fail, causing a breach of personal health information.  Furthermore, I acknowledge that it is my responsibility to provide information about my medical history, conditions and care that is complete and accurate to the best of my ability. I acknowledge that Practitioner's advice, recommendations, and/or decision may be based on factors not within their control, such as incomplete or inaccurate data provided by me or distortions of diagnostic images or specimens that may result from electronic transmissions. I understand that the practice of medicine is not an exact science and that Practitioner makes no warranties or guarantees regarding treatment outcomes. I acknowledge that a copy of this consent can be made available to me via my patient portal ([place]), or I can request a printed copy by calling the office of [facility].    I understand that my insurance will be billed for this visit.   I have read or had this consent read to me. I understand the contents of this consent, which adequately explains the benefits and risks of the Services being provided via telemedicine.  I have been provided ample opportunity to ask questions regarding this consent and the Services and have had my questions answered to my satisfaction. I give my informed consent for the services to be provided through the use of telemedicine in my medical care

## 2022-07-09 NOTE — Telephone Encounter (Signed)
Patient is returning call.  °

## 2022-07-09 NOTE — Telephone Encounter (Signed)
Pt agreeable to tele pre op appt 07/11/22 @ 3:20 due to procedure date and med hold. Pt tells me that he has already begun holding ASA as of 07/06/22. Med rec and consent are done.

## 2022-07-10 DIAGNOSIS — N1832 Chronic kidney disease, stage 3b: Secondary | ICD-10-CM | POA: Diagnosis not present

## 2022-07-11 ENCOUNTER — Ambulatory Visit: Payer: Medicare PPO | Attending: Internal Medicine | Admitting: Physician Assistant

## 2022-07-11 DIAGNOSIS — Z01810 Encounter for preprocedural cardiovascular examination: Secondary | ICD-10-CM | POA: Diagnosis not present

## 2022-07-11 NOTE — Progress Notes (Signed)
Virtual Visit via Telephone Note   Because of Justin Herrera co-morbid illnesses, he is at least at moderate risk for complications without adequate follow up.  This format is felt to be most appropriate for this patient at this time.  The patient did not have access to video technology/had technical difficulties with video requiring transitioning to audio format only (telephone).  All issues noted in this document were discussed and addressed.  No physical exam could be performed with this format.  Please refer to the patient's chart for his consent to telehealth for [facility].  Evaluation Performed:  Preoperative cardiovascular risk assessment _____________   Date:  07/11/2022   Patient ID:  Justin Herrera, DOB 1951/02/02, MRN [id number] Patient Location:  Home Provider location:   Office  Primary Care Provider:  Laurey Morale, MD Primary Cardiologist:  Justin Breeding, MD  Chief Complaint / Patient Profile   71 y.o. y/o male with a h/o anxiety, depression, diabetes mellitus type 2, GERD, heart murmur, hypertension, OSA on CPAP who is pending right L3-L4 minimally invasive decompression transforaminal lumbar interbody fusion and presents today for telephonic preoperative cardiovascular risk assessment.  Past Medical History    Past Medical History:  Diagnosis Date   Anxiety    on meds   Arthritis    generalized   Depression    on meds   Diabetes mellitus    Type II-on meds   GERD (gastroesophageal reflux disease)    Headache    Heart murmur    slight murmur noted per pt   Hypertension    on meds   Insomnia    Sleep apnea, obstructive    wears a CPAP   Symptomatic cholelithiasis    Past Surgical History:  Procedure Laterality Date   CHOLECYSTECTOMY N/A 03/28/2018   Procedure: LAPAROSCOPIC CHOLECYSTECTOMY WITH INTRAOPERATIVE CHOLANGIOGRAM;  Surgeon: Justin Pickerel, MD;  Location: [place];  Service: General;  Laterality: N/A;   COLONOSCOPY   11/20/2013   per Dr. Fuller Herrera, adenomatous polyps x 2, repeat in 5 yrs  (movi(prep good)   CYSTECTOMY Right 2014   right wrist   [place]      Allergies  No Known Allergies  History of Present Illness    Justin Herrera is a 71 y.o. male who presents via audio/video conferencing for a telehealth visit today.  Pt was last seen in cardiology clinic on 05/14/2022 by Justin Herrera.  At that time Justin Herrera was doing well.  The patient is now pending procedure as outlined above. Since his last visit, he states that he is enjoying retirement.  His activity level is somewhat modified at the moment due to his back pain and his arthritis in his knees.  He can however walk 1-2 blocks and has no trouble with stairs.  He also can participate in moderate household tasks.  For this reason he is scored a 5.07 on the DASI.  This exceeds the 4 METS minimum requirement.  He is looking forward to his back surgery which will hopefully help with his pain so he can get back to trout fishing and hiking through the woods of [place].  He has not had any concerning cardiac symptoms such as shortness of breath or chest pain.  We discussed holding aspirin x7 days prior to the procedure and restarting when medically safe to do so.    Home Medications    Prior to Admission medications  Medication Sig Start Date End Date Taking? Authorizing Provider  acetaminophen (TYLENOL) 500 MG tablet Take 1,000 mg by mouth as needed for moderate pain or headache.    [provider]  amLODipine-olmesartan (AZOR) 10-40 MG tablet Take 1 tablet by mouth daily. 08/23/21   Justin Morale, MD  aspirin 81 MG tablet Take 81 mg by mouth daily.    [provider]  atorvastatin (LIPITOR) 20 MG tablet Take 1 tablet (20 mg total) by mouth daily. 05/14/22 05/09/23  Justin Breeding, MD  Cholecalciferol (VITAMIN D-3) 125 MCG (5000 UT) TABS Take 5,000 Units by mouth daily.     [provider]  cyanocobalamin (VITAMIN B12) 1000 MCG tablet Take 1,000 mcg by mouth daily.    [provider]  diclofenac (VOLTAREN) 75 MG EC tablet TAKE 1 TABLET(75 MG) BY MOUTH TWICE DAILY 07/09/22   Justin Morale, MD  esomeprazole (NEXIUM) 40 MG capsule TAKE 1 CAPSULE(40 MG) BY MOUTH DAILY 07/09/22   Justin Morale, MD  glipiZIDE (GLUCOTROL) 10 MG tablet TAKE 1 TABLET(10 MG) BY MOUTH TWICE DAILY BEFORE A MEAL 07/09/22   Justin Herrera, Justin Halsted, MD  glucosamine-chondroitin 500-400 MG tablet Take 2 tablets by mouth daily.    [provider]  glucose blood (ONE TOUCH ULTRA TEST) test strip Once a day 07/20/15   Justin Morale, MD  hydrochlorothiazide (HYDRODIURIL) 25 MG tablet TAKE 1 TABLET(25 MG) BY MOUTH DAILY Patient taking differently: Take 25 mg by mouth daily. 05/25/22   Justin Morale, MD  Lancets [facility] ULTRASOFT) lancets Once a day 07/20/15   Justin Morale, MD  metFORMIN (GLUCOPHAGE) 500 MG tablet TAKE 1 TABLET(500 MG) BY MOUTH TWICE DAILY WITH A MEAL 07/05/22   Justin Morale, MD  metoprolol succinate (TOPROL-XL) 100 MG 24 hr tablet Take 1 tablet (100 mg total) by mouth daily. Take with or immediately following a meal. Patient taking differently: Take 100 mg by mouth daily. 08/23/21   Justin Morale, MD  sertraline (ZOLOFT) 50 MG tablet TAKE 1 TABLET(50 MG) BY MOUTH DAILY Patient taking differently: Take 50 mg by mouth daily. 06/08/22   Justin Morale, MD  Soft Lens Products (SENSITIVE EYES SALINE) SOLN Place 2-3 drops into both eyes daily as needed (to clear eyes).    [provider]  temazepam (RESTORIL) 30 MG capsule TAKE 1 CAPSULE(30 MG) BY MOUTH AT BEDTIME Patient taking differently: Take 30 mg by mouth at bedtime as needed for sleep. 03/13/22   Justin Morale, MD    Physical Exam    Vital Signs:  Justin Herrera does not have vital signs available for review today.  Given telephonic nature of communication, physical exam is  limited. AAOx3. NAD. Normal affect.  Speech and respirations are unlabored.  Accessory Clinical Findings    None  Assessment & Herrera    1.  Preoperative Cardiovascular Risk Assessment:  Justin Herrera perioperative risk of a major cardiac event is 0.9% according to the Revised Cardiac Risk Index (RCRI).  Therefore, he is at low risk for perioperative complications.   His functional capacity is good at 5.07 METs according to the [facility] Activity Status Index (DASI). Recommendations: According to ACC/AHA guidelines, no further cardiovascular testing needed.  The patient may proceed to surgery at acceptable risk.   Antiplatelet and/or Anticoagulation Recommendations: Aspirin can be held for 7 days prior to his surgery.  Please resume Aspirin post operatively when it is felt to be safe from a bleeding standpoint.  A copy of this note will be routed to requesting surgeon.  Time:   Today, I have spent 6 minutes with the patient with telehealth technology discussing medical history, symptoms, and management Herrera.     Elgie Collard, PA-C  07/11/2022, 3:24 PM

## 2022-07-15 ENCOUNTER — Encounter (HOSPITAL_COMMUNITY): Payer: Self-pay | Admitting: Neurological Surgery

## 2022-07-16 ENCOUNTER — Ambulatory Visit (HOSPITAL_COMMUNITY): Payer: Medicare PPO

## 2022-07-16 ENCOUNTER — Other Ambulatory Visit: Payer: Self-pay

## 2022-07-16 ENCOUNTER — Encounter (HOSPITAL_COMMUNITY): Payer: Self-pay | Admitting: Neurological Surgery

## 2022-07-16 ENCOUNTER — Ambulatory Visit (HOSPITAL_BASED_OUTPATIENT_CLINIC_OR_DEPARTMENT_OTHER): Payer: Medicare PPO | Admitting: Certified Registered Nurse Anesthetist

## 2022-07-16 ENCOUNTER — Observation Stay (HOSPITAL_COMMUNITY)
Admission: RE | Admit: 2022-07-16 | Discharge: 2022-07-17 | Disposition: A | Discharge status: Home / Self Care | Payer: Medicare PPO | Source: Home / Self Care | Attending: Neurological Surgery | Admitting: Neurological Surgery

## 2022-07-16 ENCOUNTER — Ambulatory Visit (HOSPITAL_COMMUNITY): Payer: Medicare PPO | Admitting: Vascular Surgery

## 2022-07-16 ENCOUNTER — Encounter (HOSPITAL_COMMUNITY)
Admission: RE | Disposition: A | Discharge status: Home / Self Care | Payer: Self-pay | Source: Home / Self Care | Attending: Neurological Surgery

## 2022-07-16 DIAGNOSIS — M5416 Radiculopathy, lumbar region: Secondary | ICD-10-CM

## 2022-07-16 DIAGNOSIS — J90 Pleural effusion, not elsewhere classified: Secondary | ICD-10-CM | POA: Diagnosis not present

## 2022-07-16 DIAGNOSIS — Z1152 Encounter for screening for COVID-19: Secondary | ICD-10-CM | POA: Diagnosis not present

## 2022-07-16 DIAGNOSIS — G4700 Insomnia, unspecified: Secondary | ICD-10-CM | POA: Diagnosis present

## 2022-07-16 DIAGNOSIS — E861 Hypovolemia: Secondary | ICD-10-CM | POA: Diagnosis present

## 2022-07-16 DIAGNOSIS — M4319 Spondylolisthesis, multiple sites in spine: Secondary | ICD-10-CM | POA: Diagnosis not present

## 2022-07-16 DIAGNOSIS — F32A Depression, unspecified: Secondary | ICD-10-CM | POA: Diagnosis present

## 2022-07-16 DIAGNOSIS — J9811 Atelectasis: Secondary | ICD-10-CM | POA: Diagnosis not present

## 2022-07-16 DIAGNOSIS — N1831 Chronic kidney disease, stage 3a: Secondary | ICD-10-CM | POA: Diagnosis not present

## 2022-07-16 DIAGNOSIS — G4733 Obstructive sleep apnea (adult) (pediatric): Secondary | ICD-10-CM | POA: Diagnosis present

## 2022-07-16 DIAGNOSIS — M4696 Unspecified inflammatory spondylopathy, lumbar region: Secondary | ICD-10-CM | POA: Diagnosis not present

## 2022-07-16 DIAGNOSIS — Z8249 Family history of ischemic heart disease and other diseases of the circulatory system: Secondary | ICD-10-CM | POA: Diagnosis not present

## 2022-07-16 DIAGNOSIS — I700 Atherosclerosis of aorta: Secondary | ICD-10-CM | POA: Diagnosis not present

## 2022-07-16 DIAGNOSIS — Z791 Long term (current) use of non-steroidal anti-inflammatories (NSAID): Secondary | ICD-10-CM | POA: Diagnosis not present

## 2022-07-16 DIAGNOSIS — N189 Chronic kidney disease, unspecified: Secondary | ICD-10-CM | POA: Diagnosis not present

## 2022-07-16 DIAGNOSIS — E1122 Type 2 diabetes mellitus with diabetic chronic kidney disease: Secondary | ICD-10-CM | POA: Diagnosis present

## 2022-07-16 DIAGNOSIS — Z9049 Acquired absence of other specified parts of digestive tract: Secondary | ICD-10-CM | POA: Diagnosis not present

## 2022-07-16 DIAGNOSIS — Y9301 Activity, walking, marching and hiking: Secondary | ICD-10-CM | POA: Diagnosis present

## 2022-07-16 DIAGNOSIS — E785 Hyperlipidemia, unspecified: Secondary | ICD-10-CM | POA: Diagnosis present

## 2022-07-16 DIAGNOSIS — E119 Type 2 diabetes mellitus without complications: Secondary | ICD-10-CM | POA: Insufficient documentation

## 2022-07-16 DIAGNOSIS — R42 Dizziness and giddiness: Secondary | ICD-10-CM | POA: Diagnosis not present

## 2022-07-16 DIAGNOSIS — M47817 Spondylosis without myelopathy or radiculopathy, lumbosacral region: Secondary | ICD-10-CM | POA: Diagnosis not present

## 2022-07-16 DIAGNOSIS — J180 Bronchopneumonia, unspecified organism: Secondary | ICD-10-CM | POA: Diagnosis not present

## 2022-07-16 DIAGNOSIS — N1832 Chronic kidney disease, stage 3b: Secondary | ICD-10-CM | POA: Diagnosis present

## 2022-07-16 DIAGNOSIS — R0689 Other abnormalities of breathing: Secondary | ICD-10-CM | POA: Diagnosis not present

## 2022-07-16 DIAGNOSIS — M5137 Other intervertebral disc degeneration, lumbosacral region: Secondary | ICD-10-CM | POA: Diagnosis not present

## 2022-07-16 DIAGNOSIS — I129 Hypertensive chronic kidney disease with stage 1 through stage 4 chronic kidney disease, or unspecified chronic kidney disease: Secondary | ICD-10-CM | POA: Diagnosis present

## 2022-07-16 DIAGNOSIS — M5116 Intervertebral disc disorders with radiculopathy, lumbar region: Secondary | ICD-10-CM | POA: Diagnosis not present

## 2022-07-16 DIAGNOSIS — R531 Weakness: Secondary | ICD-10-CM | POA: Diagnosis not present

## 2022-07-16 DIAGNOSIS — J189 Pneumonia, unspecified organism: Secondary | ICD-10-CM | POA: Diagnosis present

## 2022-07-16 DIAGNOSIS — Z79899 Other long term (current) drug therapy: Secondary | ICD-10-CM | POA: Insufficient documentation

## 2022-07-16 DIAGNOSIS — K219 Gastro-esophageal reflux disease without esophagitis: Secondary | ICD-10-CM | POA: Diagnosis present

## 2022-07-16 DIAGNOSIS — F419 Anxiety disorder, unspecified: Secondary | ICD-10-CM | POA: Diagnosis present

## 2022-07-16 DIAGNOSIS — Z7984 Long term (current) use of oral hypoglycemic drugs: Secondary | ICD-10-CM | POA: Diagnosis not present

## 2022-07-16 DIAGNOSIS — M48061 Spinal stenosis, lumbar region without neurogenic claudication: Secondary | ICD-10-CM | POA: Diagnosis present

## 2022-07-16 DIAGNOSIS — M47816 Spondylosis without myelopathy or radiculopathy, lumbar region: Secondary | ICD-10-CM | POA: Diagnosis not present

## 2022-07-16 DIAGNOSIS — R55 Syncope and collapse: Secondary | ICD-10-CM | POA: Diagnosis not present

## 2022-07-16 DIAGNOSIS — Z7982 Long term (current) use of aspirin: Secondary | ICD-10-CM | POA: Diagnosis not present

## 2022-07-16 DIAGNOSIS — J9601 Acute respiratory failure with hypoxia: Secondary | ICD-10-CM | POA: Diagnosis present

## 2022-07-16 DIAGNOSIS — W19XXXA Unspecified fall, initial encounter: Secondary | ICD-10-CM | POA: Diagnosis present

## 2022-07-16 DIAGNOSIS — J690 Pneumonitis due to inhalation of food and vomit: Secondary | ICD-10-CM | POA: Diagnosis present

## 2022-07-16 DIAGNOSIS — I959 Hypotension, unspecified: Secondary | ICD-10-CM | POA: Diagnosis not present

## 2022-07-16 DIAGNOSIS — I10 Essential (primary) hypertension: Secondary | ICD-10-CM | POA: Insufficient documentation

## 2022-07-16 DIAGNOSIS — J168 Pneumonia due to other specified infectious organisms: Secondary | ICD-10-CM | POA: Diagnosis not present

## 2022-07-16 DIAGNOSIS — R112 Nausea with vomiting, unspecified: Secondary | ICD-10-CM | POA: Diagnosis not present

## 2022-07-16 DIAGNOSIS — R000 Tachycardia, unspecified: Secondary | ICD-10-CM | POA: Diagnosis not present

## 2022-07-16 DIAGNOSIS — E871 Hypo-osmolality and hyponatremia: Secondary | ICD-10-CM | POA: Diagnosis present

## 2022-07-16 DIAGNOSIS — M4316 Spondylolisthesis, lumbar region: Secondary | ICD-10-CM | POA: Diagnosis present

## 2022-07-16 HISTORY — PX: TRANSFORAMINAL LUMBAR INTERBODY FUSION W/ MIS 1 LEVEL: SHX6145

## 2022-07-16 LAB — GLUCOSE, CAPILLARY
Glucose-Capillary: 135 mg/dL — ABNORMAL HIGH (ref 70–99)
Glucose-Capillary: 149 mg/dL — ABNORMAL HIGH (ref 70–99)
Glucose-Capillary: 218 mg/dL — ABNORMAL HIGH (ref 70–99)
Glucose-Capillary: 149 mg/dL — ABNORMAL HIGH (ref 70–99)

## 2022-07-16 LAB — ABO/RH: ABO/RH(D): O POS

## 2022-07-16 SURGERY — MINIMALLY INVASIVE (MIS) TRANSFORAMINAL LUMBAR INTERBODY FUSION (TLIF) 1 LEVEL
Anesthesia: General | Laterality: Right

## 2022-07-16 MED ORDER — DEXAMETHASONE SODIUM PHOSPHATE 10 MG/ML IJ SOLN
INTRAMUSCULAR | Status: DC | PRN
  Administered 2022-07-16: 10 mg via INTRAVENOUS

## 2022-07-16 MED ORDER — SODIUM CHLORIDE 0.9% FLUSH
3.0000 mL | Freq: Two times a day (BID) | INTRAVENOUS | Status: DC
  Administered 2022-07-16: 3 mL via INTRAVENOUS

## 2022-07-16 MED ORDER — MIDAZOLAM HCL 2 MG/2ML IJ SOLN
INTRAMUSCULAR | Status: DC | PRN
  Administered 2022-07-16: 2 mg via INTRAVENOUS

## 2022-07-16 MED ORDER — FENTANYL CITRATE (PF) 250 MCG/5ML IJ SOLN
INTRAMUSCULAR | Status: DC | PRN
  Administered 2022-07-16 (×3): 50 ug via INTRAVENOUS

## 2022-07-16 MED ORDER — FENTANYL CITRATE (PF) 100 MCG/2ML IJ SOLN
25.0000 ug | INTRAMUSCULAR | Status: DC | PRN
  Administered 2022-07-16 (×2): 50 ug via INTRAVENOUS

## 2022-07-16 MED ORDER — FENTANYL CITRATE (PF) 100 MCG/2ML IJ SOLN
INTRAMUSCULAR | Status: AC
  Filled 2022-07-16: qty 2

## 2022-07-16 MED ORDER — ONDANSETRON HCL 4 MG PO TABS
4.0000 mg | ORAL_TABLET | Freq: Four times a day (QID) | ORAL | Status: DC | PRN
  Administered 2022-07-17: 4 mg via ORAL
  Filled 2022-07-16 (×2): qty 1

## 2022-07-16 MED ORDER — LIDOCAINE 2% (20 MG/ML) 5 ML SYRINGE
INTRAMUSCULAR | Status: DC | PRN
  Administered 2022-07-16: 60 mg via INTRAVENOUS

## 2022-07-16 MED ORDER — ROCURONIUM BROMIDE 10 MG/ML (PF) SYRINGE
PREFILLED_SYRINGE | INTRAVENOUS | Status: AC
  Filled 2022-07-16: qty 10

## 2022-07-16 MED ORDER — OXYCODONE HCL 5 MG PO TABS
5.0000 mg | ORAL_TABLET | ORAL | Status: DC | PRN
  Administered 2022-07-16: 5 mg via ORAL

## 2022-07-16 MED ORDER — PHENOL 1.4 % MT LIQD: 1.0000 | OROMUCOSAL | Status: DC | PRN

## 2022-07-16 MED ORDER — FENTANYL CITRATE (PF) 250 MCG/5ML IJ SOLN
INTRAMUSCULAR | Status: AC
  Filled 2022-07-16: qty 5

## 2022-07-16 MED ORDER — CHLORHEXIDINE GLUCONATE 0.12 % MT SOLN
15.0000 mL | Freq: Once | OROMUCOSAL | Status: AC
  Administered 2022-07-16: 15 mL via OROMUCOSAL
  Filled 2022-07-16: qty 15

## 2022-07-16 MED ORDER — CEFAZOLIN SODIUM-DEXTROSE 2-4 GM/100ML-% IV SOLN
2.0000 g | INTRAVENOUS | Status: AC
  Administered 2022-07-16: 2 g via INTRAVENOUS
  Filled 2022-07-16: qty 100

## 2022-07-16 MED ORDER — ONDANSETRON HCL 4 MG/2ML IJ SOLN: 4.0000 mg | Freq: Four times a day (QID) | INTRAMUSCULAR | Status: DC | PRN

## 2022-07-16 MED ORDER — SODIUM CHLORIDE 0.9% FLUSH: 3.0000 mL | INTRAVENOUS | Status: DC | PRN

## 2022-07-16 MED ORDER — 0.9 % SODIUM CHLORIDE (POUR BTL) OPTIME
TOPICAL | Status: DC | PRN
  Administered 2022-07-16: 1000 mL

## 2022-07-16 MED ORDER — THROMBIN 5000 UNITS EX SOLR
CUTANEOUS | Status: AC
  Filled 2022-07-16: qty 5000

## 2022-07-16 MED ORDER — PHENYLEPHRINE 80 MCG/ML (10ML) SYRINGE FOR IV PUSH (FOR BLOOD PRESSURE SUPPORT)
PREFILLED_SYRINGE | INTRAVENOUS | Status: DC | PRN
  Administered 2022-07-16: 40 ug via INTRAVENOUS

## 2022-07-16 MED ORDER — SUGAMMADEX SODIUM 200 MG/2ML IV SOLN
INTRAVENOUS | Status: DC | PRN
  Administered 2022-07-16: 200 mg via INTRAVENOUS

## 2022-07-16 MED ORDER — METFORMIN HCL 500 MG PO TABS
500.0000 mg | ORAL_TABLET | Freq: Two times a day (BID) | ORAL | Status: DC
  Administered 2022-07-16 – 2022-07-17 (×2): 500 mg via ORAL
  Filled 2022-07-16 (×2): qty 1

## 2022-07-16 MED ORDER — CHLORHEXIDINE GLUCONATE CLOTH 2 % EX PADS: 6.0000 | MEDICATED_PAD | Freq: Once | CUTANEOUS | Status: DC

## 2022-07-16 MED ORDER — PROPOFOL 10 MG/ML IV BOLUS
INTRAVENOUS | Status: DC | PRN
  Administered 2022-07-16: 200 mg via INTRAVENOUS

## 2022-07-16 MED ORDER — LIDOCAINE-EPINEPHRINE 1 %-1:100000 IJ SOLN
INTRAMUSCULAR | Status: DC | PRN
  Administered 2022-07-16: 10 mL

## 2022-07-16 MED ORDER — TEMAZEPAM 7.5 MG PO CAPS: 30.0000 mg | ORAL_CAPSULE | Freq: Every evening | ORAL | Status: DC | PRN

## 2022-07-16 MED ORDER — GLIPIZIDE 5 MG PO TABS
10.0000 mg | ORAL_TABLET | Freq: Two times a day (BID) | ORAL | Status: DC
  Administered 2022-07-16 – 2022-07-17 (×2): 10 mg via ORAL
  Filled 2022-07-16 (×2): qty 2

## 2022-07-16 MED ORDER — DOCUSATE SODIUM 100 MG PO CAPS
100.0000 mg | ORAL_CAPSULE | Freq: Two times a day (BID) | ORAL | Status: DC
  Administered 2022-07-16 – 2022-07-17 (×3): 100 mg via ORAL
  Filled 2022-07-16 (×3): qty 1

## 2022-07-16 MED ORDER — PHENYLEPHRINE 80 MCG/ML (10ML) SYRINGE FOR IV PUSH (FOR BLOOD PRESSURE SUPPORT)
PREFILLED_SYRINGE | INTRAVENOUS | Status: AC
  Filled 2022-07-16: qty 10

## 2022-07-16 MED ORDER — ACETAMINOPHEN 500 MG PO TABS
1000.0000 mg | ORAL_TABLET | Freq: Once | ORAL | Status: DC
  Filled 2022-07-16: qty 2

## 2022-07-16 MED ORDER — CEFAZOLIN SODIUM-DEXTROSE 2-4 GM/100ML-% IV SOLN
2.0000 g | Freq: Three times a day (TID) | INTRAVENOUS | Status: AC
  Administered 2022-07-16 (×2): 2 g via INTRAVENOUS
  Filled 2022-07-16 (×2): qty 100

## 2022-07-16 MED ORDER — EPHEDRINE 5 MG/ML INJ
INTRAVENOUS | Status: AC
  Filled 2022-07-16: qty 5

## 2022-07-16 MED ORDER — MENTHOL 3 MG MT LOZG: 1.0000 | LOZENGE | OROMUCOSAL | Status: DC | PRN

## 2022-07-16 MED ORDER — ONDANSETRON HCL 4 MG/2ML IJ SOLN
INTRAMUSCULAR | Status: AC
  Filled 2022-07-16: qty 2

## 2022-07-16 MED ORDER — PHENYLEPHRINE HCL-NACL 20-0.9 MG/250ML-% IV SOLN
INTRAVENOUS | Status: DC | PRN
  Administered 2022-07-16: 25 ug/min via INTRAVENOUS

## 2022-07-16 MED ORDER — MIDAZOLAM HCL 2 MG/2ML IJ SOLN
INTRAMUSCULAR | Status: AC
  Filled 2022-07-16: qty 2

## 2022-07-16 MED ORDER — PROPOFOL 10 MG/ML IV BOLUS
INTRAVENOUS | Status: AC
  Filled 2022-07-16: qty 20

## 2022-07-16 MED ORDER — ROCURONIUM BROMIDE 10 MG/ML (PF) SYRINGE
PREFILLED_SYRINGE | INTRAVENOUS | Status: DC | PRN
  Administered 2022-07-16: 20 mg via INTRAVENOUS
  Administered 2022-07-16: 60 mg via INTRAVENOUS
  Administered 2022-07-16: 40 mg via INTRAVENOUS

## 2022-07-16 MED ORDER — FENTANYL CITRATE (PF) 100 MCG/2ML IJ SOLN
INTRAMUSCULAR | Status: AC
  Administered 2022-07-16: 50 ug via INTRAVENOUS
  Filled 2022-07-16: qty 2

## 2022-07-16 MED ORDER — PANTOPRAZOLE SODIUM 40 MG PO TBEC
40.0000 mg | DELAYED_RELEASE_TABLET | Freq: Every day | ORAL | Status: DC
  Administered 2022-07-17: 40 mg via ORAL
  Filled 2022-07-16 (×2): qty 1

## 2022-07-16 MED ORDER — THROMBIN 5000 UNITS EX SOLR
OROMUCOSAL | Status: DC | PRN
  Administered 2022-07-16: 5 mL via TOPICAL

## 2022-07-16 MED ORDER — OXYCODONE HCL 5 MG PO TABS
10.0000 mg | ORAL_TABLET | ORAL | Status: DC | PRN
  Administered 2022-07-16 – 2022-07-17 (×4): 10 mg via ORAL
  Filled 2022-07-16 (×4): qty 2

## 2022-07-16 MED ORDER — AMISULPRIDE (ANTIEMETIC) 5 MG/2ML IV SOLN: 10.0000 mg | Freq: Once | INTRAVENOUS | Status: DC | PRN

## 2022-07-16 MED ORDER — AMLODIPINE-OLMESARTAN 10-40 MG PO TABS: 1.0000 | ORAL_TABLET | Freq: Every day | ORAL | Status: DC

## 2022-07-16 MED ORDER — AMLODIPINE BESYLATE 5 MG PO TABS
10.0000 mg | ORAL_TABLET | Freq: Every day | ORAL | Status: DC
  Administered 2022-07-16 – 2022-07-17 (×2): 10 mg via ORAL
  Filled 2022-07-16 (×2): qty 2

## 2022-07-16 MED ORDER — OXYCODONE HCL 5 MG PO TABS
ORAL_TABLET | ORAL | Status: AC
  Filled 2022-07-16: qty 1

## 2022-07-16 MED ORDER — METOPROLOL SUCCINATE ER 100 MG PO TB24
100.0000 mg | ORAL_TABLET | Freq: Every day | ORAL | Status: DC
  Administered 2022-07-17: 100 mg via ORAL
  Filled 2022-07-16: qty 1

## 2022-07-16 MED ORDER — EPHEDRINE SULFATE-NACL 50-0.9 MG/10ML-% IV SOSY
PREFILLED_SYRINGE | INTRAVENOUS | Status: DC | PRN
  Administered 2022-07-16 (×2): 5 mg via INTRAVENOUS

## 2022-07-16 MED ORDER — SODIUM CHLORIDE 0.9 % IV SOLN: 250.0000 mL | INTRAVENOUS | Status: DC

## 2022-07-16 MED ORDER — POLYETHYLENE GLYCOL 3350 17 G PO PACK: 17.0000 g | PACK | Freq: Every day | ORAL | Status: DC | PRN

## 2022-07-16 MED ORDER — LIDOCAINE-EPINEPHRINE 1 %-1:100000 IJ SOLN
INTRAMUSCULAR | Status: AC
  Filled 2022-07-16: qty 1

## 2022-07-16 MED ORDER — DEXAMETHASONE SODIUM PHOSPHATE 10 MG/ML IJ SOLN
INTRAMUSCULAR | Status: AC
  Filled 2022-07-16: qty 1

## 2022-07-16 MED ORDER — CYCLOBENZAPRINE HCL 10 MG PO TABS
ORAL_TABLET | ORAL | Status: AC
  Filled 2022-07-16: qty 1

## 2022-07-16 MED ORDER — ONDANSETRON HCL 4 MG/2ML IJ SOLN
INTRAMUSCULAR | Status: DC | PRN
  Administered 2022-07-16: 4 mg via INTRAVENOUS

## 2022-07-16 MED ORDER — HYDROCHLOROTHIAZIDE 25 MG PO TABS
25.0000 mg | ORAL_TABLET | Freq: Every day | ORAL | Status: DC
  Administered 2022-07-16 – 2022-07-17 (×2): 25 mg via ORAL
  Filled 2022-07-16 (×2): qty 1

## 2022-07-16 MED ORDER — ORAL CARE MOUTH RINSE: 15.0000 mL | Freq: Once | OROMUCOSAL | Status: AC

## 2022-07-16 MED ORDER — LIDOCAINE 2% (20 MG/ML) 5 ML SYRINGE
INTRAMUSCULAR | Status: AC
  Filled 2022-07-16: qty 5

## 2022-07-16 MED ORDER — ACETAMINOPHEN 650 MG RE SUPP: 650.0000 mg | RECTAL | Status: DC | PRN

## 2022-07-16 MED ORDER — HYDROMORPHONE HCL 1 MG/ML IJ SOLN
1.0000 mg | INTRAMUSCULAR | Status: DC | PRN
  Administered 2022-07-16: 1 mg via INTRAVENOUS
  Filled 2022-07-16: qty 1

## 2022-07-16 MED ORDER — CYCLOBENZAPRINE HCL 10 MG PO TABS
10.0000 mg | ORAL_TABLET | Freq: Three times a day (TID) | ORAL | Status: DC | PRN
  Administered 2022-07-16 – 2022-07-17 (×3): 10 mg via ORAL
  Filled 2022-07-16 (×2): qty 1

## 2022-07-16 MED ORDER — SENSITIVE EYES SALINE SOLN: 2.0000 [drp] | Freq: Every day | Status: DC | PRN

## 2022-07-16 MED ORDER — SERTRALINE HCL 50 MG PO TABS
50.0000 mg | ORAL_TABLET | Freq: Every day | ORAL | Status: DC
  Administered 2022-07-17: 50 mg via ORAL
  Filled 2022-07-16: qty 1

## 2022-07-16 MED ORDER — ACETAMINOPHEN 325 MG PO TABS: 650.0000 mg | ORAL_TABLET | ORAL | Status: DC | PRN

## 2022-07-16 MED ORDER — IRBESARTAN 150 MG PO TABS
300.0000 mg | ORAL_TABLET | Freq: Every day | ORAL | Status: DC
  Administered 2022-07-17: 300 mg via ORAL
  Filled 2022-07-16 (×2): qty 2

## 2022-07-16 MED ORDER — ATORVASTATIN CALCIUM 10 MG PO TABS
20.0000 mg | ORAL_TABLET | Freq: Every day | ORAL | Status: DC
  Administered 2022-07-17: 20 mg via ORAL
  Filled 2022-07-16 (×2): qty 2

## 2022-07-16 MED ORDER — LACTATED RINGERS IV SOLN: INTRAVENOUS | Status: DC

## 2022-07-16 SURGICAL SUPPLY — 73 items
ADH SKN CLS APL DERMABOND .7 (GAUZE/BANDAGES/DRESSINGS) ×2
BAG COUNTER SPONGE SURGICOUNT (BAG) ×2 IMPLANT
BAG SPNG CNTER NS LX DISP (BAG) ×4
BAND INSRT 18 STRL LF DISP RB (MISCELLANEOUS) ×4
BAND RUBBER #18 3X1/16 STRL (MISCELLANEOUS) ×4 IMPLANT
BASKET BONE COLLECTION (BASKET) ×2 IMPLANT
BLADE CLIPPER SURG (BLADE) IMPLANT
BLADE SURG 11 STRL SS (BLADE) ×2 IMPLANT
BUR 14 MATCH 3 (BUR) IMPLANT
BUR MATCHSTICK NEURO 3.0 LAGG (BURR) IMPLANT
BUR MR8 14CM BALL SYMTRI 5 (BUR) IMPLANT
BUR SURG IBUR 4X12.5 (BURR) ×2 IMPLANT
BURR 14 MATCH 3 (BUR) ×2
BURR MR8 14CM BALL SYMTRI 5 (BUR)
BURR SURG IBUR 4X12.5 (BURR) ×2
CAGE EXP CATALYFT 9 (Plate) IMPLANT
CNTNR URN SCR LID CUP LEK RST (MISCELLANEOUS) ×2 IMPLANT
CONT SPEC 4OZ STRL OR WHT (MISCELLANEOUS) ×2
COVER BACK TABLE 60X90IN (DRAPES) ×2 IMPLANT
COVERAGE SUPPORT O-ARM STEALTH (MISCELLANEOUS) ×2 IMPLANT
DERMABOND ADVANCED .7 DNX12 (GAUZE/BANDAGES/DRESSINGS) ×2 IMPLANT
DRAPE C-ARM 42X72 X-RAY (DRAPES) ×2 IMPLANT
DRAPE C-ARMOR (DRAPES) ×2 IMPLANT
DRAPE LAPAROTOMY 100X72X124 (DRAPES) ×2 IMPLANT
DRAPE MICROSCOPE SLANT 54X150 (MISCELLANEOUS) ×2 IMPLANT
DRAPE SHEET LG 3/4 BI-LAMINATE (DRAPES) ×8 IMPLANT
DRAPE SURG 17X23 STRL (DRAPES) ×4 IMPLANT
ELECT BLADE 6.5 EXT (BLADE) ×2 IMPLANT
ELECT REM PT RETURN 9FT ADLT (ELECTROSURGICAL) ×2
ELECTRODE REM PT RTRN 9FT ADLT (ELECTROSURGICAL) ×2 IMPLANT
EXTENDER TAB GUIDE SV 5.5/6.0 (INSTRUMENTS) IMPLANT
FEE COVERAGE SUPPORT O-ARM (MISCELLANEOUS) ×2 IMPLANT
GAUZE 4X4 16PLY ~~LOC~~+RFID DBL (SPONGE) IMPLANT
GAUZE SPONGE 4X4 12PLY STRL (GAUZE/BANDAGES/DRESSINGS) ×2 IMPLANT
GLOVE BIOGEL PI IND STRL 7.5 (GLOVE) ×2 IMPLANT
GLOVE ECLIPSE 7.5 STRL STRAW (GLOVE) ×2 IMPLANT
GLOVE INDICATOR 8.5 STRL (GLOVE) IMPLANT
GLOVE SURG SS PI 7.5 STRL IVOR (GLOVE) IMPLANT
GLOVE SURG SS PI 8.0 STRL IVOR (GLOVE) IMPLANT
GOWN STRL REUS W/ TWL LRG LVL3 (GOWN DISPOSABLE) ×2 IMPLANT
GOWN STRL REUS W/ TWL XL LVL3 (GOWN DISPOSABLE) IMPLANT
GOWN STRL REUS W/TWL 2XL LVL3 (GOWN DISPOSABLE) IMPLANT
GOWN STRL REUS W/TWL LRG LVL3 (GOWN DISPOSABLE) ×8
GOWN STRL REUS W/TWL XL LVL3 (GOWN DISPOSABLE)
HEMOSTAT POWDER KIT SURGIFOAM (HEMOSTASIS) ×2 IMPLANT
KIT BASIN OR (CUSTOM PROCEDURE TRAY) ×2 IMPLANT
KIT POSITION SURG JACKSON T1 (MISCELLANEOUS) ×2 IMPLANT
KIT TURNOVER KIT B (KITS) IMPLANT
MARKER SPHERE PSV REFLC NDI (MISCELLANEOUS) ×10 IMPLANT
NDL HYPO 18GX1.5 BLUNT FILL (NEEDLE) IMPLANT
NDL SPNL 18GX3.5 QUINCKE PK (NEEDLE) IMPLANT
NEEDLE HYPO 18GX1.5 BLUNT FILL (NEEDLE) IMPLANT
NEEDLE HYPO 22GX1.5 SAFETY (NEEDLE) ×2 IMPLANT
NEEDLE SPNL 18GX3.5 QUINCKE PK (NEEDLE) IMPLANT
NS IRRIG 1000ML POUR BTL (IV SOLUTION) ×2 IMPLANT
PACK LAMINECTOMY NEURO (CUSTOM PROCEDURE TRAY) ×2 IMPLANT
PAD ARMBOARD 7.5X6 YLW CONV (MISCELLANEOUS) ×4 IMPLANT
PIN BONE FIX 100 (PIN) IMPLANT
ROD 5.5 CCM PERC 40 (Rod) IMPLANT
ROD PERC CCM 5.5X35 (Rod) IMPLANT
SCREW SET 5.5/6.0MM SOLERA (Screw) IMPLANT
SCREW SPINAL IFIX 6.5X45 (Screw) IMPLANT
SPIKE FLUID TRANSFER (MISCELLANEOUS) ×2 IMPLANT
SPONGE T-LAP 4X18 ~~LOC~~+RFID (SPONGE) IMPLANT
SUT MNCRL AB 3-0 PS2 18 (SUTURE) ×2 IMPLANT
SUT VIC AB 0 CT1 18XCR BRD8 (SUTURE) IMPLANT
SUT VIC AB 0 CT1 8-18 (SUTURE) ×2
SUT VIC AB 2-0 CP2 18 (SUTURE) ×2 IMPLANT
SYR 3ML LL SCALE MARK (SYRINGE) IMPLANT
TOWEL GREEN STERILE (TOWEL DISPOSABLE) ×2 IMPLANT
TOWEL GREEN STERILE FF (TOWEL DISPOSABLE) ×2 IMPLANT
TRAY FOLEY MTR SLVR 16FR STAT (SET/KITS/TRAYS/PACK) IMPLANT
WATER STERILE IRR 1000ML POUR (IV SOLUTION) ×2 IMPLANT

## 2022-07-16 NOTE — Transfer of Care (Signed)
Immediate Anesthesia Transfer of Care Note  Patient: Justin Herrera  Procedure(s) Performed: Right Lumbar three-four MinimallyInvasive Surgery Decompression/Transforaminal Lumbar Interbody Fusion (Right) Application of O-Arm  Patient Location: PACU  Anesthesia Type:General  Level of Consciousness: drowsy  Airway & Oxygen Therapy: Patient Spontanous Breathing  Post-op Assessment: Report given to RN and Post -op Vital signs reviewed and stable  Post vital signs: Reviewed and stable  Last Vitals:  Vitals Value Taken Time  BP 121/73 07/16/22 1115  Temp    Pulse 70 07/16/22 1115  Resp 23 07/16/22 1115  SpO2 93 % 07/16/22 1115    Last Pain:  Vitals:   07/16/22 0617  TempSrc: Oral  PainSc: 2          Complications: No notable events documented.

## 2022-07-16 NOTE — H&P (Signed)
Surgical H&P Update  HPI: 71 y.o. with a history of RLE and low back pain. 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 No changes in health since they were last seen. Still having the above and wishes to proceed with surgery.  PMHx:  Past Medical History:  Diagnosis Date   Anxiety    on meds   Arthritis    generalized   Depression    on meds   Diabetes mellitus    Type II-on meds   GERD (gastroesophageal reflux disease)    Headache    Heart murmur    slight murmur noted per pt   Hypertension    on meds   Insomnia    Sleep apnea, obstructive    wears a CPAP   Symptomatic cholelithiasis    FamHx:  Family History  Problem Relation Age of Onset   Hypertension Mother    Hypertension Father    Heart attack Maternal Grandfather 69       Died suddenly "family lore suggested an MI"   Colon cancer Neg Hx    Stomach cancer Neg Hx    Colon polyps Neg Hx    Esophageal cancer Neg Hx    Rectal cancer Neg Hx    SocHx:  reports that he has never smoked. He has never used smokeless tobacco. He reports that he does not currently use alcohol. He reports that he does not use drugs.  Physical Exam: Strength 5/5 x4 and SILTx4 except R L3 numbness  Assesment/Plan: 71 y.o. 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 Risks, benefits, and alternatives discussed and the patient would like to continue with surgery.  -OR today -3C post-op  Judith Part, MD 07/16/22 7:47 AM

## 2022-07-16 NOTE — Anesthesia Procedure Notes (Signed)
Procedure Name: Intubation Date/Time: 07/16/2022 8:02 AM  Performed by: Carolan Clines, CRNAPre-anesthesia Checklist: Patient identified, Emergency Drugs available, Suction available and Patient being monitored Patient Re-evaluated:Patient Re-evaluated prior to induction Oxygen Delivery Method: Circle System Utilized Preoxygenation: Pre-oxygenation with 100% oxygen Induction Type: IV induction Ventilation: Mask ventilation without difficulty and Oral airway inserted - appropriate to patient size Laryngoscope Size: Mac and 4 Grade View: Grade I Tube type: Oral Tube size: 7.5 mm Number of attempts: 1 Airway Equipment and Method: Stylet and Oral airway Placement Confirmation: ETT inserted through vocal cords under direct vision, positive ETCO2 and breath sounds checked- equal and bilateral Secured at: 23 cm Tube secured with: Tape Dental Injury: Teeth and Oropharynx as per pre-operative assessment

## 2022-07-16 NOTE — Anesthesia Postprocedure Evaluation (Signed)
Anesthesia Post Note  Patient: KRISH BAILLY  Procedure(s) Performed: Right Lumbar three-four MinimallyInvasive Surgery Decompression/Transforaminal Lumbar Interbody Fusion (Right) Application of O-Arm     Patient location during evaluation: PACU Anesthesia Type: General Level of consciousness: awake and alert Pain management: pain level controlled Vital Signs Assessment: post-procedure vital signs reviewed and stable Respiratory status: spontaneous breathing, nonlabored ventilation, respiratory function stable and patient connected to nasal cannula oxygen Cardiovascular status: blood pressure returned to baseline and stable Postop Assessment: no apparent nausea or vomiting Anesthetic complications: no   No notable events documented.  Last Vitals:  Vitals:   07/16/22 1245 07/16/22 1323  BP: 110/60 (!) 137/93  Pulse: (!) 58 (!) 55  Resp: 18 18  Temp:    SpO2: 97% 98%    Last Pain:  Vitals:   07/16/22 1300  TempSrc:   PainSc: 7                  Tiajuana Amass

## 2022-07-16 NOTE — Op Note (Signed)
PATIENT: Justin Herrera  DAY OF SURGERY: 07/16/22   PRE-OPERATIVE DIAGNOSIS:  Lumbar radiculopathy   POST-OPERATIVE DIAGNOSIS:  Same   PROCEDURE:  Right L3-L4 minimally invasive laminectomy and transforaminal lumbar interbody fusion, use of frameless stereotaxy and intra-operative CT scan   SURGEON:  Surgeon(s) and Role:    Judith Part, MD - Primary    Kary Kos, MD - Assisting   ANESTHESIA: ETGA   BRIEF HISTORY: This is a 71 year old man who presented with right lower extremity and low back pain. Workup showed multi-level disease, his symptoms best localized to severe foraminal and lateral recess stenosis on the right at L3-4 with a spondylolisthesis at that level. His symptoms were refractory, I therefore recommended an MIS decompression and TLIF at that level. This was discussed with the patient as well as risks, benefits, and alternatives and wished to proceed with surgery.   OPERATIVE DETAIL:  The patient was taken to the operating room and placed on the OR table in the prone position. A formal time out was performed with two patient identifiers and confirmed the operative site. Anesthesia was induced by the anesthesia team. The operative site was marked, hair was clipped with surgical clippers, the area was then prepped and draped in a sterile fashion.   A small incision was made over the PSIS and a percutaneous hip pain was placed into the pelvis and connected to a reference array for use with frameless navigation. The field was covered and the O-arm was brought into the field. An intraoperative CT was obtained and transferred to the Stealth station, which was registered to the patient's anatomy using the registration frame. The fit appeared to be acceptable. Stereotactic spinal navigation was utilized throughout the procedure for planning and placement of pedicle screw trajectories. The pedicles were marked and used to create skin incisions bilaterally. Contralateral to the  TLIF, percutaneous pedicle screws were placed at L3 and L4 using stereotactic navigation. These were placed by creating a navigated pilot hole then using a navigated awl-tap, palpated, and the screws were placed. Ipsilateral to the TLIF, this was repeated but the trajectories were created, palpated, tapped, but screws were not placed to keep the screw heads from obscuring visualization during the decompression.  Using stereotactic guidance, a [employer] tube was then docked to the right L3-L4 facet and decompression was performed.  An L3-L4 laminectomy was performed by removing the lamina ipsilaterally followed by ligamentum flavum, following this contralaterally to the contralateral lateral recess, and then inferiorly to the ligamentous insertion on the inferior lamina. Of note, this was obviously more decompression than would be needed for instrumentation alone.  A right L3-L4 facetectomy was then performed and the right L3 nerve root was decompressed along its entire course. The disc space was identified, incised, and a discectomy was performed in the standard fashion. Using navigated instruments, the endplates were prepped, bone graft was packed into the disc space, and an expandable cage ([employer]) was packed with autograft and placed into the disc space using navigation. The tube was removed and hemostasis was obtained during its removal.   Ipsilateral instrumentation was then completed by placing the screws into the previously created trajectories. A rod was sized and introduced on both sides then final tightened. A final xray was used to confirm that the hardware was in good position. Hemostasis was again confirmed for both incisions, they were copiously irrigated, and then closed in layers.    EBL:  62m   DRAINS: none  SPECIMENS: none   Judith Part, MD 07/16/22 7:52 AM

## 2022-07-16 NOTE — Progress Notes (Signed)
Pt set up on CPAP tolerating well.

## 2022-07-17 ENCOUNTER — Inpatient Hospital Stay (HOSPITAL_COMMUNITY)
Admission: EM | Admit: 2022-07-17 | Discharge: 2022-07-19 | DRG: 981 | Disposition: A | Discharge status: Home Health | Payer: Medicare PPO | Attending: Family Medicine | Admitting: Family Medicine

## 2022-07-17 ENCOUNTER — Other Ambulatory Visit: Payer: Self-pay

## 2022-07-17 DIAGNOSIS — Z8249 Family history of ischemic heart disease and other diseases of the circulatory system: Secondary | ICD-10-CM

## 2022-07-17 DIAGNOSIS — Y9301 Activity, walking, marching and hiking: Secondary | ICD-10-CM | POA: Diagnosis present

## 2022-07-17 DIAGNOSIS — I10 Essential (primary) hypertension: Secondary | ICD-10-CM | POA: Diagnosis present

## 2022-07-17 DIAGNOSIS — Z79899 Other long term (current) drug therapy: Secondary | ICD-10-CM

## 2022-07-17 DIAGNOSIS — K219 Gastro-esophageal reflux disease without esophagitis: Secondary | ICD-10-CM | POA: Diagnosis present

## 2022-07-17 DIAGNOSIS — A419 Sepsis, unspecified organism: Secondary | ICD-10-CM | POA: Insufficient documentation

## 2022-07-17 DIAGNOSIS — Z9049 Acquired absence of other specified parts of digestive tract: Secondary | ICD-10-CM

## 2022-07-17 DIAGNOSIS — Z7984 Long term (current) use of oral hypoglycemic drugs: Secondary | ICD-10-CM

## 2022-07-17 DIAGNOSIS — F32A Depression, unspecified: Secondary | ICD-10-CM | POA: Diagnosis present

## 2022-07-17 DIAGNOSIS — F419 Anxiety disorder, unspecified: Secondary | ICD-10-CM | POA: Insufficient documentation

## 2022-07-17 DIAGNOSIS — Z791 Long term (current) use of non-steroidal anti-inflammatories (NSAID): Secondary | ICD-10-CM

## 2022-07-17 DIAGNOSIS — Z7982 Long term (current) use of aspirin: Secondary | ICD-10-CM

## 2022-07-17 DIAGNOSIS — J180 Bronchopneumonia, unspecified organism: Secondary | ICD-10-CM | POA: Diagnosis present

## 2022-07-17 DIAGNOSIS — E119 Type 2 diabetes mellitus without complications: Secondary | ICD-10-CM

## 2022-07-17 DIAGNOSIS — M4316 Spondylolisthesis, lumbar region: Secondary | ICD-10-CM | POA: Diagnosis present

## 2022-07-17 DIAGNOSIS — N1832 Chronic kidney disease, stage 3b: Secondary | ICD-10-CM | POA: Diagnosis present

## 2022-07-17 DIAGNOSIS — Z1152 Encounter for screening for COVID-19: Secondary | ICD-10-CM

## 2022-07-17 DIAGNOSIS — W19XXXA Unspecified fall, initial encounter: Secondary | ICD-10-CM | POA: Diagnosis present

## 2022-07-17 DIAGNOSIS — E1122 Type 2 diabetes mellitus with diabetic chronic kidney disease: Secondary | ICD-10-CM | POA: Diagnosis present

## 2022-07-17 DIAGNOSIS — J9601 Acute respiratory failure with hypoxia: Secondary | ICD-10-CM | POA: Diagnosis present

## 2022-07-17 DIAGNOSIS — G4733 Obstructive sleep apnea (adult) (pediatric): Secondary | ICD-10-CM | POA: Diagnosis present

## 2022-07-17 DIAGNOSIS — E861 Hypovolemia: Secondary | ICD-10-CM | POA: Diagnosis present

## 2022-07-17 DIAGNOSIS — N183 Chronic kidney disease, stage 3 unspecified: Secondary | ICD-10-CM | POA: Diagnosis present

## 2022-07-17 DIAGNOSIS — I129 Hypertensive chronic kidney disease with stage 1 through stage 4 chronic kidney disease, or unspecified chronic kidney disease: Secondary | ICD-10-CM | POA: Diagnosis present

## 2022-07-17 DIAGNOSIS — J189 Pneumonia, unspecified organism: Principal | ICD-10-CM

## 2022-07-17 DIAGNOSIS — E785 Hyperlipidemia, unspecified: Secondary | ICD-10-CM | POA: Insufficient documentation

## 2022-07-17 DIAGNOSIS — M5416 Radiculopathy, lumbar region: Secondary | ICD-10-CM | POA: Diagnosis present

## 2022-07-17 DIAGNOSIS — M48061 Spinal stenosis, lumbar region without neurogenic claudication: Secondary | ICD-10-CM | POA: Diagnosis present

## 2022-07-17 DIAGNOSIS — G4700 Insomnia, unspecified: Secondary | ICD-10-CM | POA: Diagnosis present

## 2022-07-17 DIAGNOSIS — J690 Pneumonitis due to inhalation of food and vomit: Principal | ICD-10-CM | POA: Diagnosis present

## 2022-07-17 DIAGNOSIS — E871 Hypo-osmolality and hyponatremia: Secondary | ICD-10-CM | POA: Diagnosis present

## 2022-07-17 LAB — GLUCOSE, CAPILLARY: Glucose-Capillary: 135 mg/dL — ABNORMAL HIGH (ref 70–99)

## 2022-07-17 MED ORDER — ONDANSETRON HCL 4 MG PO TABS: 4.0000 mg | ORAL_TABLET | Freq: Four times a day (QID) | ORAL | 0 refills | Status: DC | PRN

## 2022-07-17 MED ORDER — BACLOFEN 10 MG PO TABS
5.0000 mg | ORAL_TABLET | Freq: Three times a day (TID) | ORAL | Status: DC | PRN
  Administered 2022-07-17: 5 mg via ORAL
  Filled 2022-07-17: qty 1

## 2022-07-17 MED ORDER — OXYCODONE HCL 5 MG PO TABS: 5.0000 mg | ORAL_TABLET | ORAL | 0 refills | Status: DC | PRN

## 2022-07-17 MED ORDER — BACLOFEN 5 MG PO TABS: 5.0000 mg | ORAL_TABLET | Freq: Three times a day (TID) | ORAL | 0 refills | Status: DC | PRN

## 2022-07-17 NOTE — Evaluation (Signed)
Occupational Therapy Evaluation Patient Details Name: Justin Herrera MRN: [id number] DOB: 01/27/1951 Today's Date: 07/17/2022   History of Present Illness Pt is a 70 y/o M presenting for R L3-4 minimally invasive TLIF. PMH includes anxiety, arthritis, depression, DM2, GERD, and HTN.   Clinical Impression   Pt reports independence at baseline with ADLs and functional mobility, lives with spouse who can assist at d/c. Pt currently needing min guard-mod A for ADLs, min guard for bed mobility,and min guard for transfers without AD. Pt educated on precautions and compensatory strategies for ADLs, pt able to demo and verbalize understanding. Pt presenting with impairments listed below, will follow acutely. Anticipate no OT follow up needs at d/c.     Recommendations for follow up therapy are one component of a multi-disciplinary discharge planning process, led by the attending physician.  Recommendations may be updated based on patient status, additional functional criteria and insurance authorization.   Follow Up Recommendations  No OT follow up     Assistance Recommended at Discharge Set up Supervision/Assistance  Patient can return home with the following A little help with walking and/or transfers;A little help with bathing/dressing/bathroom;Assistance with cooking/housework;Help with stairs or ramp for entrance;Assist for transportation    Functional Status Assessment  Patient has had a recent decline in their functional status and demonstrates the ability to make significant improvements in function in a reasonable and predictable amount of time.  Equipment Recommendations  None recommended by OT (pt has necessary DME)    Recommendations for Other Services PT consult     Precautions / Restrictions Precautions Precautions: Back Precaution Booklet Issued: Yes (comment) Precaution Comments: educated pt on 3/3 back precautions Required Braces or Orthoses: Other Brace Other  Brace: no brace needed per MD order Restrictions Weight Bearing Restrictions: No      Mobility Bed Mobility Overal bed mobility: Needs Assistance Bed Mobility: Sidelying to Sit, Sit to Sidelying, Rolling Rolling: Min guard Sidelying to sit: Min guard     Sit to sidelying: Min guard General bed mobility comments: + use of bedrails    Transfers Overall transfer level: Needs assistance Equipment used: None Transfers: Sit to/from Stand Sit to Stand: Min guard                  Balance Overall balance assessment: Mild deficits observed, not formally tested                                         ADL either performed or assessed with clinical judgement   ADL Overall ADL's : Needs assistance/impaired Eating/Feeding: Supervision/ safety   Grooming: Min guard   Upper Body Bathing: Min guard   Lower Body Bathing: Moderate assistance   Upper Body Dressing : Min guard   Lower Body Dressing: Moderate assistance   Toilet Transfer: Min guard;Ambulation;Regular Toilet   Toileting- Clothing Manipulation and Hygiene: Minimal assistance       Functional mobility during ADLs: Min guard;Rolling walker (2 wheels)       Vision   Vision Assessment?: No apparent visual deficits     Perception Perception Perception Tested?: No   Praxis Praxis Praxis tested?: Not tested    Pertinent Vitals/Pain Pain Assessment Pain Assessment: Faces Pain Score: 8  Faces Pain Scale: Hurts whole lot Pain Location: low back (incision) Pain Descriptors / Indicators: Discomfort Pain Intervention(s): Limited activity within patient's tolerance, Monitored during session, Repositioned  Hand Dominance     Extremity/Trunk Assessment Upper Extremity Assessment Upper Extremity Assessment: Overall WFL for tasks assessed   Lower Extremity Assessment Lower Extremity Assessment: Defer to PT evaluation   Cervical / Trunk Assessment Cervical / Trunk Assessment: Back  Surgery   Communication Communication Communication: No difficulties   Cognition Arousal/Alertness: Awake/alert Behavior During Therapy: WFL for tasks assessed/performed Overall Cognitive Status: Within Functional Limits for tasks assessed                                       General Comments  VSS on RA    Exercises     Shoulder Instructions      Home Living Family/patient expects to be discharged to:: Private residence Living Arrangements: Spouse/significant other Available Help at Discharge: Available 24 hours/day Type of Home: House Home Access: Stairs to enter [employer] of Steps: 2   Home Layout: Two level;Bed/bath upstairs     Bathroom Shower/Tub: Occupational psychologist: Standard     Home Equipment: Shower seat;Other (comment) (plans to obtain RW from friend)          Prior Functioning/Environment Prior Level of Function : Independent/Modified Independent                        OT Problem List: Decreased strength;Decreased activity tolerance;Decreased range of motion;Pain;Impaired balance (sitting and/or standing)      OT Treatment/Interventions: Self-care/ADL training;Therapeutic exercise;Energy conservation;DME and/or AE instruction;Therapeutic activities;Patient/family education;Balance training    OT Goals(Current goals can be found in the care plan section) Acute Rehab OT Goals Patient Stated Goal: none stated OT Goal Formulation: With patient Time For Goal Achievement: 07/31/22 Potential to Achieve Goals: Good  OT Frequency: Min 2X/week    Co-evaluation              AM-PAC OT "6 Clicks" Daily Activity     Outcome Measure Help from another person eating meals?: None Help from another person taking care of personal grooming?: None Help from another person toileting, which includes using toliet, bedpan, or urinal?: A Little Help from another person bathing (including washing, rinsing,  drying)?: A Lot Help from another person to put on and taking off regular upper body clothing?: None Help from another person to put on and taking off regular lower body clothing?: A Lot 6 Click Score: 19   End of Session Nurse Communication: Mobility status  Activity Tolerance: Patient tolerated treatment well Patient left: in bed;with call bell/phone within reach;with family/visitor present  OT Visit Diagnosis: Unsteadiness on feet (R26.81);Other abnormalities of gait and mobility (R26.89);Muscle weakness (generalized) (M62.81)                Time: 3419-6222 OT Time Calculation (min): 19 min Charges:  OT General Charges $OT Visit: 1 Visit OT Evaluation $OT Eval Low Complexity: 1 Low  Shareena Nusz K, OTD, OTR/L SecureChat Preferred Acute Rehab [phone]  Renaye Rakers Koonce 07/17/2022, 8:54 AM

## 2022-07-17 NOTE — ED Triage Notes (Signed)
Pt BIB [facility] from home, pt has had intermittent dizziness for the last day, walking down stairs and felt lightheaded, causing him to fall, landing on his bottom and continuing down several stairs. Unsure if pt had complete LOC, states he remembers most of the event. Recent lumbar surgery.

## 2022-07-17 NOTE — Discharge Summary (Signed)
Discharge Summary  Date of Admission: 07/16/2022  Date of Discharge: 07/17/22  Attending Physician: Emelda Brothers, MD  Hospital Course: Patient was admitted following an uncomplicated right X6-1 MIS TLIF. They were recovered in PACU and transferred to [facility]. Their preop symptoms were improved post-op, their hospital course was uncomplicated and the patient was discharged home on 07/17/22. They will follow up in clinic with me in clinic in 2 weeks.  Neurologic exam at discharge:  Strength 5/5 x4 and SILTx4  Discharge diagnosis: Lumbar radiculopathy, lumbar spondylolisthesis  Judith Part, MD 07/17/22 4:59 PM

## 2022-07-17 NOTE — Progress Notes (Signed)
Neurosurgery Service Progress Note  Subjective: No acute events overnight, radicular pain resolved, back pain as expected, hiccoughs after anesthesia   Objective: Vitals:   07/16/22 2003 07/16/22 2120 07/16/22 2324 07/17/22 0600  BP: (!) 139/58  117/70 (!) 115/57  Pulse: 74 75 73 75  Resp: '18 20 20 20  '$ Temp: 98.2 F (36.8 C)  99.1 F (37.3 C) (!) 100.7 F (38.2 C)  TempSrc: Oral  Oral Oral  SpO2: 95% 98% 96% 96%  Weight:      Height:        Physical Exam: Strength 5/5 x4 and SILTx4  Assessment & Plan: 71 y.o. man s/p MIS decompression and TLIF, recovering well.  -discharge home today  Judith Part  07/17/22 8:00 AM

## 2022-07-17 NOTE — Evaluation (Signed)
Physical Therapy Evaluation  Patient Details Name: Justin Herrera MRN: [id number] DOB: Dec 05, 1950 Today's Date: 07/17/2022  History of Present Illness  Pt is a 71 y/o M presenting for R L3-4 TLIF on 07/16/2022. PMH includes anxiety, arthritis, depression, DM2, and HTN.   Clinical Impression  Pt admitted with above diagnosis. At the time of PT eval, pt was able to demonstrate transfers and ambulation with gross min guard assist to min assist and RW for support. Pt was educated on precautions, brace application/wearing schedule, appropriate activity progression, and car transfer. Pt currently with functional limitations due to the deficits listed below (see PT Problem List). Pt will benefit from skilled PT to increase their independence and safety with mobility to allow discharge to the venue listed below.         Recommendations for follow up therapy are one component of a multi-disciplinary discharge planning process, led by the attending physician.  Recommendations may be updated based on patient status, additional functional criteria and insurance authorization.  Follow Up Recommendations No PT follow up      Assistance Recommended at Discharge PRN  Patient can return home with the following  A little help with walking and/or transfers;A little help with bathing/dressing/bathroom;Assistance with cooking/housework;Assist for transportation;Help with stairs or ramp for entrance    Equipment Recommendations Rolling walker (2 wheels)  Recommendations for Other Services       Functional Status Assessment Patient has had a recent decline in their functional status and demonstrates the ability to make significant improvements in function in a reasonable and predictable amount of time.     Precautions / Restrictions Precautions Precautions: Back Precaution Booklet Issued: Yes (comment) Precaution Comments: Reviewed handout and pt was cued for precautions during functional  mobility. Other Brace: no brace needed per MD order Restrictions Weight Bearing Restrictions: No      Mobility  Bed Mobility Overal bed mobility: Needs Assistance Bed Mobility: Sidelying to Sit, Sit to Sidelying, Rolling Rolling: Min guard Sidelying to sit: Min assist       General bed mobility comments: HOB lowered but not flat. Pt with heavy use of rails for support and required assist to elevate trunk fully to sitting position. Increased time throughout due to pain.    Transfers Overall transfer level: Needs assistance Equipment used: Rolling walker (2 wheels) Transfers: Sit to/from Stand Sit to Stand: Min guard           General transfer comment: Close guard for safety as pt powered up to full stand.    Ambulation/Gait Ambulation/Gait assistance: Min guard Gait Distance (Feet): 300 Feet Assistive device: Rolling walker (2 wheels) Gait Pattern/deviations: Step-through pattern, Decreased stride length, Trunk flexed Gait velocity: Decreased Gait velocity interpretation: <1.8 ft/sec, indicate of risk for recurrent falls   General Gait Details: VC's for improved posture, closer walker proximity, and forward gaze. Pt appears to have uncoordinated gait cycle, and with heavy reliance on UE's on RW.  Stairs Stairs: Yes Stairs assistance: Min guard Stair Management: Two rails, Alternating pattern, Forwards Number of Stairs: 10 General stair comments: Pt rushing and with poor maintenance of precautions due to heavy lean on railings for support. Pt not slowing down to make corrective changes with cues.  Wheelchair Mobility    Modified Rankin (Stroke Patients Only)       Balance Overall balance assessment: Mild deficits observed, not formally tested  Pertinent Vitals/Pain Pain Assessment Pain Assessment: Faces Faces Pain Scale: Hurts whole lot Pain Location: low back (incision) Pain Descriptors /  Indicators: Discomfort Pain Intervention(s): Limited activity within patient's tolerance, Monitored during session, Repositioned    Home Living Family/patient expects to be discharged to:: Private residence Living Arrangements: Spouse/significant other Available Help at Discharge: Available 24 hours/day Type of Home: House Home Access: Stairs to enter   [employer] of Steps: 2 Alternate Level Stairs-Number of Steps: flight Home Layout: Two level;Bed/bath upstairs Home Equipment: Shower seat;Other (comment) (plans to obtain RW from friend)      Prior Function Prior Level of Function : Independent/Modified Independent                     Hand Dominance        Extremity/Trunk Assessment   Upper Extremity Assessment Upper Extremity Assessment: Defer to OT evaluation    Lower Extremity Assessment Lower Extremity Assessment: Generalized weakness (MIld; limited by pain.)    Cervical / Trunk Assessment Cervical / Trunk Assessment: Back Surgery  Communication   Communication: No difficulties  Cognition Arousal/Alertness: Awake/alert Behavior During Therapy: WFL for tasks assessed/performed Overall Cognitive Status: Within Functional Limits for tasks assessed                                          General Comments      Exercises     Assessment/Plan    PT Assessment Patient needs continued PT services  PT Problem List Decreased strength;Decreased activity tolerance;Decreased balance;Decreased mobility;Decreased knowledge of use of DME;Decreased safety awareness;Decreased knowledge of precautions;Pain       PT Treatment Interventions DME instruction;Gait training;Stair training;Functional mobility training;Therapeutic activities;Balance training;Patient/family education    PT Goals (Current goals can be found in the Care Plan section)  Acute Rehab PT Goals Patient Stated Goal: home today, decrease pain PT Goal Formulation: With  patient/family Time For Goal Achievement: 07/24/22 Potential to Achieve Goals: Good    Frequency Min 5X/week     Co-evaluation               AM-PAC PT "6 Clicks" Mobility  Outcome Measure Help needed turning from your back to your side while in a flat bed without using bedrails?: A Little Help needed moving from lying on your back to sitting on the side of a flat bed without using bedrails?: A Little Help needed moving to and from a bed to a chair (including a wheelchair)?: A Little Help needed standing up from a chair using your arms (e.g., wheelchair or bedside chair)?: A Little Help needed to walk in hospital room?: A Little Help needed climbing 3-5 steps with a railing? : A Little 6 Click Score: 18    End of Session Equipment Utilized During Treatment: Gait belt Activity Tolerance: Patient tolerated treatment well Patient left: in bed;with call bell/phone within reach;with family/visitor present Nurse Communication: Mobility status PT Visit Diagnosis: Unsteadiness on feet (R26.81);Pain Pain - part of body:  (back)    Time: 8891-6945 PT Time Calculation (min) (ACUTE ONLY): 23 min   Charges:   PT Evaluation $PT Eval Low Complexity: 1 Low PT Treatments $Gait Training: 8-22 mins        Rolinda Roan, PT, DPT Acute Rehabilitation Services Secure Chat Preferred Office: [phone]   Thelma Comp 07/17/2022, 1:23 PM

## 2022-07-17 NOTE — Plan of Care (Signed)
Pt and wife given D/C instructions with verbal understanding. Rx's were sent to the pharmacy by MD. Pt's incision is clean and dry with no sign of infection. Pt's IV was removed prior to D/C. Pt received RW from [employer] per MD order. Pt D/C'd home via wheelchair per MD order. Pt is stable @ D/C and has no other needs at this time. Larkin Ina

## 2022-07-18 ENCOUNTER — Encounter (HOSPITAL_COMMUNITY): Payer: Self-pay | Admitting: Neurological Surgery

## 2022-07-18 ENCOUNTER — Emergency Department (HOSPITAL_COMMUNITY): Payer: Medicare PPO

## 2022-07-18 DIAGNOSIS — J189 Pneumonia, unspecified organism: Secondary | ICD-10-CM | POA: Diagnosis present

## 2022-07-18 DIAGNOSIS — Z7984 Long term (current) use of oral hypoglycemic drugs: Secondary | ICD-10-CM | POA: Diagnosis not present

## 2022-07-18 DIAGNOSIS — Z9049 Acquired absence of other specified parts of digestive tract: Secondary | ICD-10-CM | POA: Diagnosis not present

## 2022-07-18 DIAGNOSIS — Z791 Long term (current) use of non-steroidal anti-inflammatories (NSAID): Secondary | ICD-10-CM | POA: Diagnosis not present

## 2022-07-18 DIAGNOSIS — E871 Hypo-osmolality and hyponatremia: Secondary | ICD-10-CM | POA: Diagnosis present

## 2022-07-18 DIAGNOSIS — Z1152 Encounter for screening for COVID-19: Secondary | ICD-10-CM | POA: Diagnosis not present

## 2022-07-18 DIAGNOSIS — F419 Anxiety disorder, unspecified: Secondary | ICD-10-CM | POA: Diagnosis present

## 2022-07-18 DIAGNOSIS — N1831 Chronic kidney disease, stage 3a: Secondary | ICD-10-CM

## 2022-07-18 DIAGNOSIS — A419 Sepsis, unspecified organism: Secondary | ICD-10-CM | POA: Insufficient documentation

## 2022-07-18 DIAGNOSIS — Z7982 Long term (current) use of aspirin: Secondary | ICD-10-CM | POA: Diagnosis not present

## 2022-07-18 DIAGNOSIS — I129 Hypertensive chronic kidney disease with stage 1 through stage 4 chronic kidney disease, or unspecified chronic kidney disease: Secondary | ICD-10-CM | POA: Diagnosis present

## 2022-07-18 DIAGNOSIS — F32A Depression, unspecified: Secondary | ICD-10-CM | POA: Diagnosis present

## 2022-07-18 DIAGNOSIS — G4700 Insomnia, unspecified: Secondary | ICD-10-CM | POA: Diagnosis present

## 2022-07-18 DIAGNOSIS — Z79899 Other long term (current) drug therapy: Secondary | ICD-10-CM | POA: Diagnosis not present

## 2022-07-18 DIAGNOSIS — J9601 Acute respiratory failure with hypoxia: Secondary | ICD-10-CM | POA: Diagnosis present

## 2022-07-18 DIAGNOSIS — G4733 Obstructive sleep apnea (adult) (pediatric): Secondary | ICD-10-CM | POA: Diagnosis present

## 2022-07-18 DIAGNOSIS — K219 Gastro-esophageal reflux disease without esophagitis: Secondary | ICD-10-CM | POA: Diagnosis present

## 2022-07-18 DIAGNOSIS — W19XXXA Unspecified fall, initial encounter: Secondary | ICD-10-CM | POA: Diagnosis present

## 2022-07-18 DIAGNOSIS — M4316 Spondylolisthesis, lumbar region: Secondary | ICD-10-CM | POA: Diagnosis present

## 2022-07-18 DIAGNOSIS — E861 Hypovolemia: Secondary | ICD-10-CM | POA: Diagnosis present

## 2022-07-18 DIAGNOSIS — N1832 Chronic kidney disease, stage 3b: Secondary | ICD-10-CM | POA: Diagnosis present

## 2022-07-18 DIAGNOSIS — Z8249 Family history of ischemic heart disease and other diseases of the circulatory system: Secondary | ICD-10-CM | POA: Diagnosis not present

## 2022-07-18 DIAGNOSIS — N183 Chronic kidney disease, stage 3 unspecified: Secondary | ICD-10-CM | POA: Diagnosis present

## 2022-07-18 DIAGNOSIS — M5416 Radiculopathy, lumbar region: Secondary | ICD-10-CM | POA: Diagnosis present

## 2022-07-18 DIAGNOSIS — E785 Hyperlipidemia, unspecified: Secondary | ICD-10-CM | POA: Diagnosis present

## 2022-07-18 DIAGNOSIS — J180 Bronchopneumonia, unspecified organism: Secondary | ICD-10-CM | POA: Diagnosis not present

## 2022-07-18 DIAGNOSIS — Y9301 Activity, walking, marching and hiking: Secondary | ICD-10-CM | POA: Diagnosis present

## 2022-07-18 DIAGNOSIS — E1122 Type 2 diabetes mellitus with diabetic chronic kidney disease: Secondary | ICD-10-CM | POA: Diagnosis present

## 2022-07-18 DIAGNOSIS — J690 Pneumonitis due to inhalation of food and vomit: Secondary | ICD-10-CM | POA: Diagnosis present

## 2022-07-18 DIAGNOSIS — M48061 Spinal stenosis, lumbar region without neurogenic claudication: Secondary | ICD-10-CM | POA: Diagnosis present

## 2022-07-18 LAB — PROCALCITONIN: Procalcitonin: 1.54 ng/mL

## 2022-07-18 LAB — COMPREHENSIVE METABOLIC PANEL
ALT: 18 U/L (ref 0–44)
AST: 49 U/L — ABNORMAL HIGH (ref 15–41)
Albumin: 3.3 g/dL — ABNORMAL LOW (ref 3.5–5.0)
Alkaline Phosphatase: 35 U/L — ABNORMAL LOW (ref 38–126)
Anion gap: 13 (ref 5–15)
BUN: 36 mg/dL — ABNORMAL HIGH (ref 8–23)
CO2: 23 mmol/L (ref 22–32)
Calcium: 8.9 mg/dL (ref 8.9–10.3)
Chloride: 97 mmol/L — ABNORMAL LOW (ref 98–111)
Creatinine, Ser: 2.04 mg/dL — ABNORMAL HIGH (ref 0.61–1.24)
GFR, Estimated: 34 mL/min — ABNORMAL LOW (ref >60)
Glucose, Bld: 210 mg/dL — ABNORMAL HIGH (ref 70–99)
Potassium: 3.6 mmol/L (ref 3.5–5.1)
Sodium: 133 mmol/L — ABNORMAL LOW (ref 135–145)
Total Bilirubin: 1.5 mg/dL — ABNORMAL HIGH (ref 0.3–1.2)
Total Protein: 6.1 g/dL — ABNORMAL LOW (ref 6.5–8.1)

## 2022-07-18 LAB — URINALYSIS, ROUTINE W REFLEX MICROSCOPIC
Bacteria, UA: NONE SEEN
Bilirubin Urine: NEGATIVE
Bilirubin Urine: NEGATIVE
Glucose, UA: NEGATIVE mg/dL
Glucose, UA: NEGATIVE mg/dL
Ketones, ur: 5 mg/dL — AB
Ketones, ur: 5 mg/dL — AB
Leukocytes,Ua: NEGATIVE
Leukocytes,Ua: NEGATIVE
Nitrite: NEGATIVE
Nitrite: NEGATIVE
Protein, ur: 30 mg/dL — AB
Protein, ur: 30 mg/dL — AB
Specific Gravity, Urine: 1.017 (ref 1.005–1.030)
Specific Gravity, Urine: 1.02 (ref 1.005–1.030)
pH: 5 (ref 5.0–8.0)
pH: 5 (ref 5.0–8.0)

## 2022-07-18 LAB — CBC WITH DIFFERENTIAL/PLATELET
Abs Immature Granulocytes: 0.08 10*3/uL — ABNORMAL HIGH (ref 0.00–0.07)
Basophils Absolute: 0 10*3/uL (ref 0.0–0.1)
Basophils Relative: 0 %
Eosinophils Absolute: 0 10*3/uL (ref 0.0–0.5)
Eosinophils Relative: 0 %
HCT: 34.5 % — ABNORMAL LOW (ref 39.0–52.0)
Hemoglobin: 12.4 g/dL — ABNORMAL LOW (ref 13.0–17.0)
Immature Granulocytes: 1 %
Lymphocytes Relative: 2 %
Lymphs Abs: 0.2 10*3/uL — ABNORMAL LOW (ref 0.7–4.0)
MCH: 31.7 pg (ref 26.0–34.0)
MCHC: 35.9 g/dL (ref 30.0–36.0)
MCV: 88.2 fL (ref 80.0–100.0)
Monocytes Absolute: 0.7 10*3/uL (ref 0.1–1.0)
Monocytes Relative: 7 %
Neutro Abs: 9.1 10*3/uL — ABNORMAL HIGH (ref 1.7–7.7)
Neutrophils Relative %: 90 %
Platelets: 136 10*3/uL — ABNORMAL LOW (ref 150–400)
RBC: 3.91 MIL/uL — ABNORMAL LOW (ref 4.22–5.81)
RDW: 12.7 % (ref 11.5–15.5)
WBC: 10 10*3/uL (ref 4.0–10.5)
nRBC: 0 % (ref 0.0–0.2)

## 2022-07-18 LAB — RESP PANEL BY RT-PCR (FLU A&B, COVID) ARPGX2
Influenza A by PCR: NEGATIVE
Influenza B by PCR: NEGATIVE
SARS Coronavirus 2 by RT PCR: NEGATIVE

## 2022-07-18 LAB — GLUCOSE, CAPILLARY: Glucose-Capillary: 162 mg/dL — ABNORMAL HIGH (ref 70–99)

## 2022-07-18 LAB — CBG MONITORING, ED
Glucose-Capillary: 101 mg/dL — ABNORMAL HIGH (ref 70–99)
Glucose-Capillary: 130 mg/dL — ABNORMAL HIGH (ref 70–99)
Glucose-Capillary: 157 mg/dL — ABNORMAL HIGH (ref 70–99)
Glucose-Capillary: 182 mg/dL — ABNORMAL HIGH (ref 70–99)

## 2022-07-18 LAB — STREP PNEUMONIAE URINARY ANTIGEN: Strep Pneumo Urinary Antigen: NEGATIVE

## 2022-07-18 MED ORDER — HEPARIN SODIUM (PORCINE) 5000 UNIT/ML IJ SOLN
5000.0000 [IU] | Freq: Three times a day (TID) | INTRAMUSCULAR | Status: DC
  Administered 2022-07-18 – 2022-07-19 (×5): 5000 [IU] via SUBCUTANEOUS
  Filled 2022-07-18 (×5): qty 1

## 2022-07-18 MED ORDER — SODIUM CHLORIDE 0.9 % IV SOLN: 2.0000 g | INTRAVENOUS | Status: DC

## 2022-07-18 MED ORDER — IRBESARTAN 300 MG PO TABS
300.0000 mg | ORAL_TABLET | Freq: Every day | ORAL | Status: DC
  Administered 2022-07-18 – 2022-07-19 (×2): 300 mg via ORAL
  Filled 2022-07-18 (×2): qty 1

## 2022-07-18 MED ORDER — ACETAMINOPHEN 650 MG RE SUPP: 650.0000 mg | RECTAL | Status: DC | PRN

## 2022-07-18 MED ORDER — ASPIRIN 81 MG PO TBEC
81.0000 mg | DELAYED_RELEASE_TABLET | Freq: Every day | ORAL | Status: DC
  Administered 2022-07-18 – 2022-07-19 (×2): 81 mg via ORAL
  Filled 2022-07-18 (×3): qty 1

## 2022-07-18 MED ORDER — SODIUM CHLORIDE 0.9 % IV SOLN
2.0000 g | INTRAVENOUS | Status: DC
  Administered 2022-07-19: 2 g via INTRAVENOUS
  Filled 2022-07-18: qty 20

## 2022-07-18 MED ORDER — SODIUM CHLORIDE 0.9 % IV SOLN: INTRAVENOUS | Status: DC

## 2022-07-18 MED ORDER — ONDANSETRON HCL 4 MG/2ML IJ SOLN
4.0000 mg | Freq: Once | INTRAMUSCULAR | Status: AC
  Administered 2022-07-18: 4 mg via INTRAVENOUS
  Filled 2022-07-18: qty 2

## 2022-07-18 MED ORDER — SODIUM CHLORIDE 0.9 % IV BOLUS
1000.0000 mL | Freq: Once | INTRAVENOUS | Status: AC
  Administered 2022-07-18: 1000 mL via INTRAVENOUS

## 2022-07-18 MED ORDER — INSULIN ASPART 100 UNIT/ML IJ SOLN
0.0000 [IU] | Freq: Three times a day (TID) | INTRAMUSCULAR | Status: DC
  Administered 2022-07-18: 3 [IU] via SUBCUTANEOUS
  Administered 2022-07-18: 2 [IU] via SUBCUTANEOUS
  Administered 2022-07-19: 8 [IU] via SUBCUTANEOUS

## 2022-07-18 MED ORDER — ACETAMINOPHEN 325 MG PO TABS
650.0000 mg | ORAL_TABLET | Freq: Four times a day (QID) | ORAL | Status: DC | PRN
  Administered 2022-07-19: 650 mg via ORAL
  Filled 2022-07-18: qty 2

## 2022-07-18 MED ORDER — ATORVASTATIN CALCIUM 10 MG PO TABS
20.0000 mg | ORAL_TABLET | Freq: Every day | ORAL | Status: DC
  Administered 2022-07-18 – 2022-07-19 (×2): 20 mg via ORAL
  Filled 2022-07-18 (×2): qty 2

## 2022-07-18 MED ORDER — ACETAMINOPHEN 325 MG PO TABS
650.0000 mg | ORAL_TABLET | Freq: Once | ORAL | Status: AC
  Administered 2022-07-18: 650 mg via ORAL
  Filled 2022-07-18: qty 2

## 2022-07-18 MED ORDER — PANTOPRAZOLE SODIUM 40 MG PO TBEC
40.0000 mg | DELAYED_RELEASE_TABLET | Freq: Every day | ORAL | Status: DC
  Administered 2022-07-18 – 2022-07-19 (×2): 40 mg via ORAL
  Filled 2022-07-18 (×2): qty 1

## 2022-07-18 MED ORDER — BACLOFEN 10 MG PO TABS
5.0000 mg | ORAL_TABLET | Freq: Three times a day (TID) | ORAL | Status: DC | PRN
  Administered 2022-07-18 – 2022-07-19 (×2): 5 mg via ORAL
  Filled 2022-07-18 (×2): qty 1

## 2022-07-18 MED ORDER — SODIUM CHLORIDE 0.9 % IV SOLN
500.0000 mg | Freq: Once | INTRAVENOUS | Status: AC
  Administered 2022-07-18: 500 mg via INTRAVENOUS
  Filled 2022-07-18: qty 5

## 2022-07-18 MED ORDER — SODIUM CHLORIDE 0.9 % IV BOLUS (SEPSIS)
1000.0000 mL | Freq: Once | INTRAVENOUS | Status: AC
  Administered 2022-07-18: 1000 mL via INTRAVENOUS

## 2022-07-18 MED ORDER — AMLODIPINE BESYLATE 10 MG PO TABS
10.0000 mg | ORAL_TABLET | Freq: Every day | ORAL | Status: DC
  Administered 2022-07-18 – 2022-07-19 (×2): 10 mg via ORAL
  Filled 2022-07-18: qty 1
  Filled 2022-07-18: qty 2

## 2022-07-18 MED ORDER — ONDANSETRON HCL 4 MG/2ML IJ SOLN
4.0000 mg | Freq: Four times a day (QID) | INTRAMUSCULAR | Status: DC | PRN
  Administered 2022-07-18 (×2): 4 mg via INTRAVENOUS
  Filled 2022-07-18 (×2): qty 2

## 2022-07-18 MED ORDER — SERTRALINE HCL 25 MG PO TABS
50.0000 mg | ORAL_TABLET | Freq: Every day | ORAL | Status: DC
  Administered 2022-07-18 – 2022-07-19 (×2): 50 mg via ORAL
  Filled 2022-07-18: qty 2
  Filled 2022-07-18: qty 1

## 2022-07-18 MED ORDER — HYDROMORPHONE HCL 1 MG/ML IJ SOLN
1.0000 mg | INTRAMUSCULAR | Status: DC | PRN
  Administered 2022-07-18: 1 mg via INTRAVENOUS
  Filled 2022-07-18: qty 1

## 2022-07-18 MED ORDER — SODIUM CHLORIDE 0.9 % IV SOLN
500.0000 mg | INTRAVENOUS | Status: DC
  Administered 2022-07-19: 500 mg via INTRAVENOUS
  Filled 2022-07-18: qty 5

## 2022-07-18 MED ORDER — OXYCODONE HCL 5 MG PO TABS
5.0000 mg | ORAL_TABLET | ORAL | Status: DC | PRN
  Administered 2022-07-18 – 2022-07-19 (×3): 5 mg via ORAL
  Filled 2022-07-18 (×3): qty 1

## 2022-07-18 MED ORDER — FENTANYL CITRATE PF 50 MCG/ML IJ SOSY
50.0000 ug | PREFILLED_SYRINGE | Freq: Once | INTRAMUSCULAR | Status: AC
  Administered 2022-07-18: 50 ug via INTRAVENOUS
  Filled 2022-07-18: qty 1

## 2022-07-18 MED ORDER — METOPROLOL SUCCINATE ER 50 MG PO TB24
100.0000 mg | ORAL_TABLET | Freq: Every day | ORAL | Status: DC
  Administered 2022-07-18 – 2022-07-19 (×2): 100 mg via ORAL
  Filled 2022-07-18: qty 2
  Filled 2022-07-18: qty 4

## 2022-07-18 MED ORDER — AMLODIPINE-OLMESARTAN 10-40 MG PO TABS: 1.0000 | ORAL_TABLET | Freq: Every day | ORAL | Status: DC

## 2022-07-18 MED ORDER — SODIUM CHLORIDE 0.9 % IV SOLN: 500.0000 mg | INTRAVENOUS | Status: DC

## 2022-07-18 MED ORDER — SODIUM CHLORIDE 0.9 % IV SOLN
1.0000 g | Freq: Once | INTRAVENOUS | Status: AC
  Administered 2022-07-18: 1 g via INTRAVENOUS
  Filled 2022-07-18: qty 10

## 2022-07-18 MED ORDER — INSULIN ASPART 100 UNIT/ML IJ SOLN: 0.0000 [IU] | Freq: Every day | INTRAMUSCULAR | Status: DC

## 2022-07-18 NOTE — ED Provider Notes (Signed)
[facility] EMERGENCY DEPARTMENT Provider Note   CSN: [id number] Arrival date & time: 07/17/22  2345     History  Chief Complaint  Patient presents with   Near Syncope   Fall    MAVRYK PINO is a 71 y.o. male.  The history is provided by the patient and the EMS personnel.  Patient presents from home.  Patient underwent lumbar surgery on November 20 without any complications After being discharged on November 21, he began having intermittent dizziness and nausea and vomiting.  He reports just prior to arrival he was walking down stairs when he felt dizzy and he landed on his buttocks and slid down.  No head injury.  No LOC.  He reports back pain since the surgery but no other new pain complaints. No chest pain or shortness of breath.  No cough. He does report vomiting without diarrhea He denies any new weakness in his legs.  No urinary or fecal incontinence    Home Medications Prior to Admission medications   Medication Sig Start Date End Date Taking? Authorizing Provider  acetaminophen (TYLENOL) 500 MG tablet Take 1,000 mg by mouth as needed for moderate pain or headache.    [provider]  amLODipine-olmesartan (AZOR) 10-40 MG tablet Take 1 tablet by mouth daily. 08/23/21   Laurey Morale, MD  aspirin 81 MG tablet Take 81 mg by mouth daily.    [provider]  atorvastatin (LIPITOR) 20 MG tablet Take 1 tablet (20 mg total) by mouth daily. 05/14/22 05/09/23  Minus Breeding, MD  baclofen 5 MG TABS Take 5 mg by mouth 3 (three) times daily as needed (hiccoughs or muscle spasms). 07/17/22   Judith Part, MD  Cholecalciferol (VITAMIN D-3) 125 MCG (5000 UT) TABS Take 5,000 Units by mouth daily.    [provider]  cyanocobalamin (VITAMIN B12) 1000 MCG tablet Take 1,000 mcg by mouth daily.    [provider]  diclofenac (VOLTAREN) 75 MG EC tablet TAKE 1 TABLET(75 MG) BY MOUTH TWICE DAILY 07/09/22   Laurey Morale, MD   esomeprazole (NEXIUM) 40 MG capsule TAKE 1 CAPSULE(40 MG) BY MOUTH DAILY 07/09/22   Laurey Morale, MD  glipiZIDE (GLUCOTROL) 10 MG tablet TAKE 1 TABLET(10 MG) BY MOUTH TWICE DAILY BEFORE A MEAL 07/09/22   Isaac Bliss, Rayford Halsted, MD  glucosamine-chondroitin 500-400 MG tablet Take 2 tablets by mouth daily.    [provider]  glucose blood (ONE TOUCH ULTRA TEST) test strip Once a day 07/20/15   Laurey Morale, MD  hydrochlorothiazide (HYDRODIURIL) 25 MG tablet TAKE 1 TABLET(25 MG) BY MOUTH DAILY Patient taking differently: Take 25 mg by mouth daily. 05/25/22   Laurey Morale, MD  Lancets [facility] ULTRASOFT) lancets Once a day 07/20/15   Laurey Morale, MD  metFORMIN (GLUCOPHAGE) 500 MG tablet TAKE 1 TABLET(500 MG) BY MOUTH TWICE DAILY WITH A MEAL 07/05/22   Laurey Morale, MD  metoprolol succinate (TOPROL-XL) 100 MG 24 hr tablet Take 1 tablet (100 mg total) by mouth daily. Take with or immediately following a meal. Patient taking differently: Take 100 mg by mouth daily. 08/23/21   Laurey Morale, MD  ondansetron (ZOFRAN) 4 MG tablet Take 1 tablet (4 mg total) by mouth every 6 (six) hours as needed for nausea or vomiting. 07/17/22   Judith Part, MD  oxyCODONE (OXY IR/ROXICODONE) 5 MG immediate release tablet Take 1 tablet (5 mg total) by mouth every 4 (four)  hours as needed (pain). 07/17/22   Judith Part, MD  sertraline (ZOLOFT) 50 MG tablet TAKE 1 TABLET(50 MG) BY MOUTH DAILY Patient taking differently: Take 50 mg by mouth daily. 06/08/22   Laurey Morale, MD  Soft Lens Products (SENSITIVE EYES SALINE) SOLN Place 2-3 drops into both eyes daily as needed (to clear eyes).    [provider]  temazepam (RESTORIL) 30 MG capsule TAKE 1 CAPSULE(30 MG) BY MOUTH AT BEDTIME Patient taking differently: Take 30 mg by mouth at bedtime as needed for sleep. 03/13/22   Laurey Morale, MD      Allergies    Patient has no known allergies.    Review of Systems   Review of  Systems  Constitutional:  Positive for fatigue.  Respiratory:  Negative for shortness of breath.   Cardiovascular:  Negative for chest pain.  Gastrointestinal:  Positive for vomiting.    Physical Exam Updated Vital Signs BP 123/60 (BP Location: Left Arm)   Pulse 88   Temp (!) 102.8 F (39.3 C) (Rectal)   Resp 18   SpO2 90%  Physical Exam CONSTITUTIONAL: Elderly, no acute distress HEAD: Normocephalic/atraumatic EYES: EOMI/PERRL ENMT: Mucous membranes moist NECK: supple no meningeal signs SPINE/BACK: Bandage noted to the lumbar spine without any active bleeding around it.  No bruising is noted. The rest of the cervical and thoracic spine are nontender CV: S1/S2 noted, no murmurs/rubs/gallops noted LUNGS: Lungs are clear to auscultation bilaterally, no apparent distress He is wearing oxygen 2 L nasal cannula ABDOMEN: soft, nontender, no rebound or guarding, bowel sounds noted throughout abdomen GU:no cva tenderness NEURO: Pt is awake/alert/appropriate, moves all extremitiesx4.  No facial droop.  He moves both lower extremities without any difficulty. EXTREMITIES: pulses normal/equal, full ROM, all other extremities/joints palpated/ranged and nontender SKIN: warm, color normal PSYCH: no abnormalities of mood noted, alert and oriented to situation  ED Results / Procedures / Treatments   Labs (all labs ordered are listed, but only abnormal results are displayed) Labs Reviewed  CBC WITH DIFFERENTIAL/PLATELET - Abnormal; Notable for the following components:      Result Value   RBC 3.91 (*)    Hemoglobin 12.4 (*)    HCT 34.5 (*)    Platelets 136 (*)    Neutro Abs 9.1 (*)    Lymphs Abs 0.2 (*)    Abs Immature Granulocytes 0.08 (*)    All other components within normal limits  COMPREHENSIVE METABOLIC PANEL - Abnormal; Notable for the following components:   Sodium 133 (*)    Chloride 97 (*)    Glucose, Bld 210 (*)    BUN 36 (*)    Creatinine, Ser 2.04 (*)    Total Protein  6.1 (*)    Albumin 3.3 (*)    AST 49 (*)    Alkaline Phosphatase 35 (*)    Total Bilirubin 1.5 (*)    GFR, Estimated 34 (*)    All other components within normal limits  URINALYSIS, ROUTINE W REFLEX MICROSCOPIC - Abnormal; Notable for the following components:   APPearance HAZY (*)    Hgb urine dipstick MODERATE (*)    Ketones, ur 5 (*)    Protein, ur 30 (*)    All other components within normal limits  CBG MONITORING, ED - Abnormal; Notable for the following components:   Glucose-Capillary 182 (*)    All other components within normal limits  RESP PANEL BY RT-PCR (FLU A&B, COVID) [id number]    EKG EKG Interpretation  Date/Time:  Wednesday July 18 2022 00:36:12 EST Ventricular Rate:  83 PR Interval:  164 QRS Duration: 118 QT Interval:  356 QTC Calculation: 419 R Axis:   -12 Text Interpretation: Sinus rhythm Multiple premature complexes, vent & supraven Probable left atrial enlargement Left ventricular hypertrophy Nonspecific T abnormalities, inferior leads No previous ECGs available Confirmed by Ripley Fraise [id number]) on 07/18/2022 12:58:57 AM  Radiology CT Chest Wo Contrast  Result Date: 07/18/2022 CLINICAL DATA:  71 year old male with weakness, fall, respiratory illness. Dizziness. EXAM: CT CHEST WITHOUT CONTRAST TECHNIQUE: Multidetector CT imaging of the chest was performed following the standard protocol without IV contrast. RADIATION DOSE REDUCTION: This exam was performed according to the departmental dose-optimization program which includes automated exposure control, adjustment of the mA and/or kV according to patient size and/or use of iterative reconstruction technique. COMPARISON:  Chest radiographs 0023 hours today. FINDINGS: Cardiovascular: Calcified aortic atherosclerosis. Calcified coronary artery atherosclerosis and/or stents is extensive. Cardiac size remains within normal limits. No pericardial effusion. Vascular patency is not evaluated in the absence of IV  contrast. Mediastinum/Nodes: No mediastinal mass or lymphadenopathy identified on this noncontrast exam. Lungs/Pleura: Major airways are patent, trace retained secretions along the right lateral wall of the subglottic trachea. Somewhat low lung volumes. Patchy and confluent right lower lobe peribronchial opacity with elevated right hemidiaphragm. This right lower lobe opacity is somewhat greater than that typical of atelectasis. Superimposed trace layering pleural effusions. Patchy opacity in the left costophrenic angle. Elsewhere ventilation is within normal limits. There is some upper lobe subpleural scarring suspected. Upper Abdomen: Elevated right hemidiaphragm. Subjacent noncontrast liver appears negative. Gallbladder not visible, might be surgically absent. Negative visible noncontrast spleen, and bowel in the upper abdomen. No upper abdominal free air free fluid. Musculoskeletal: No acute or suspicious osseous lesion identified. IMPRESSION: 1. Low lung volumes with elevated right hemidiaphragm, right lower lobe peribronchial opacity, and mild opacity in the left costophrenic angle. Superimposed trace bilateral pleural effusions. The appearance is indeterminate for atelectasis versus Bronchopneumonia. 2. Extensive calcified coronary artery atherosclerosis and/or stents. Aortic Atherosclerosis (ICD10-I70.0). Electronically Signed   By: Genevie Ann M.D.   On: 07/18/2022 05:15   DG Lumbar Spine Complete  Result Date: 07/18/2022 CLINICAL DATA:  Weakness, fall. EXAM: LUMBAR SPINE - COMPLETE 4+ VIEW COMPARISON:  07/16/2022. FINDINGS: There is no evidence of acute lumbar spine fracture. Alignment is normal. Lumbar spinal fusion hardware is noted at L3-L4. A transitional vertebral body is present at S1. Intervertebral disc space narrowing and degenerative endplate changes are noted at L4-L5 and L5-S1. Facet arthropathy is present in the lower lumbar spine. Surgical clips are present in the right upper quadrant.  Atherosclerotic calcification of the aorta is noted. IMPRESSION: 1. No acute fracture. 2. Lumbar spinal fusion hardware at L3-L4. 3. Degenerative changes in the lower lumbar spine. Electronically Signed   By: Brett Fairy M.D.   On: 07/18/2022 00:39   DG Chest 2 View  Result Date: 07/18/2022 CLINICAL DATA:  Weakness, fall. EXAM: CHEST - 2 VIEW COMPARISON:  None Available. FINDINGS: The heart size and mediastinal contours are within normal limits. There is atherosclerotic calcification of the aorta. Lung volumes are low. No consolidation, effusion, or pneumothorax. Degenerative changes are present in the thoracic spine. No acute osseous abnormality. IMPRESSION: No active cardiopulmonary disease. Electronically Signed   By: Brett Fairy M.D.   On: 07/18/2022 00:36   DG Lumbar Spine 1 View  Result Date: 07/16/2022 CLINICAL DATA:  Elective surgery, intraop imaging for L3-L4 hardware check  EXAM: LUMBAR SPINE - 1 VIEW COMPARISON:  None Available. FINDINGS: Single lateral intraoperative radiograph. Postsurgical changes of L3-L4 anterior and posterior fusion. No target where. No evidence of immediate complication. Moderate degenerative disc disease at L4-L5 and L5-S1 with trace anterolisthesis at L4-L5. Lower lumbar facet arthropathy. IMPRESSION: Postsurgical changes of anterior and posterior fusion at L3-L4. No evidence of immediate complication. Electronically Signed   By: Maurine Simmering M.D.   On: 07/16/2022 11:45   DG O-ARM IMAGE ONLY/NO REPORT  Result Date: 07/16/2022 There is no Radiologist interpretation  for this exam.   Procedures .Critical Care  Performed by: Ripley Fraise, MD Authorized by: Ripley Fraise, MD   Critical care provider statement:    Critical care time (minutes):  90   Critical care start time:  07/18/2022 4:00 AM   Critical care end time:  07/18/2022 5:30 AM   Critical care time was exclusive of:  Separately billable procedures and treating other patients   Critical  care was necessary to treat or prevent imminent or life-threatening deterioration of the following conditions:  Respiratory failure, sepsis and CNS failure or compromise   Critical care was time spent personally by me on the following activities:  Obtaining history from patient or surrogate, evaluation of patient's response to treatment, development of treatment plan with patient or surrogate, discussions with consultants, examination of patient, pulse oximetry, ordering and review of radiographic studies, ordering and review of laboratory studies, ordering and performing treatments and interventions, re-evaluation of patient's condition and review of old charts   I assumed direction of critical care for this patient from another provider in my specialty: no     Care discussed with: admitting provider       Medications Ordered in ED Medications  cefTRIAXone (ROCEPHIN) 1 g in sodium chloride 0.9 % 100 mL IVPB (has no administration in time range)  azithromycin (ZITHROMAX) 500 mg in sodium chloride 0.9 % 250 mL IVPB (has no administration in time range)  sodium chloride 0.9 % bolus 1,000 mL (0 mLs Intravenous Stopped 07/18/22 0330)  ondansetron (ZOFRAN) injection 4 mg (4 mg Intravenous Given 07/18/22 0045)  acetaminophen (TYLENOL) tablet 650 mg (650 mg Oral Given 07/18/22 0114)  sodium chloride 0.9 % bolus 1,000 mL (0 mLs Intravenous Stopped 07/18/22 0315)    ED Course/ Medical Decision Making/ A&P Clinical Course as of 07/18/22 0539  Wed Jul 18, 2022  0059 Glucose(!): 210 Hyperglycemia [DW]  0059 Creatinine(!): 2.04 Acute kidney injury [DW]  0105 Patient just had lumbar laminectomy and fusion on November 20.  He did well postoperatively.  However on review of chart he did have a fever prior to discharge from the hospital.  Patient is now febrile.  He denies any incontinence, and no focal weakness.  Workup is pending at this time [DW]  0112 Prehospital EKG revealed sinus rhythm with multiple  PVCs [DW]  0251 Patient without any complaints, resting comfortably.  Awaiting urinalysis [DW]  0413 No obvious initial source for his fever.  Patient resting comfortably.  However with any movement or if he is off oxygen, he does desaturate.  This is a new finding for patient.  He has no underlying lung disease.  He denies any abdominal pain.  His only complaint is postoperative back pain.  I did speak to the on-call neurosurgeon, who reports this is a very unlikely to be a postoperative infection given how close it is to his surgery.  This could be a viral illness or other cause of his  fever. [DW]  0414 Given the change in his oxygenation, recent fevers, and he does have crackles on repeat exam, will proceed with CT chest to evaluate for pneumonia. [DW]  [phone] he denies any pleuritic chest pain to suggest PE [DW]  0506 Cardiac monitoring was reviewed, patient does have episodes of PVCs, but no other arrhythmias [DW]  0529 CT scan read confirms right lower lobe pneumonia.  Will start antibiotics.  Patient will need to be admitted [DW]  0539 Discussed the case with Dr. Ernesto Rutherford with [facility].  Patient will need to be admitted for IV antibiotics.  Neurosurgery will likely need to be called back if their input is warranted.  Patient has no signs of acute surgical emergency. [DW]    Clinical Course User Index [DW] Ripley Fraise, MD                           Medical Decision Making Amount and/or Complexity of Data Reviewed Labs: ordered. Decision-making details documented in ED Course. Radiology: ordered. ECG/medicine tests: ordered.  Risk OTC drugs. Prescription drug management. Decision regarding hospitalization.   This patient presents to the ED for concern of weakness, this involves an extensive number of treatment options, and is a complaint that carries with it a high risk of complications and morbidity.  The differential diagnosis includes but is not limited to CVA, intracranial  hemorrhage, acute coronary syndrome, renal failure, urinary tract infection, electrolyte disturbance, pneumonia    Comorbidities that complicate the patient evaluation: Patient's presentation is complicated by their history of hypertension and diabetes   Additional history obtained: Additional history obtained from EMS  Records reviewed previous admission documents  Lab Tests: I Ordered, and personally interpreted labs.  The pertinent results include: Hyperglycemia, dehydration  Imaging Studies ordered: I ordered imaging studies including X-ray chest and lumbar spine   I independently visualized and interpreted imaging which showed no acute findings I agree with the radiologist interpretation  Cardiac Monitoring: The patient was maintained on a cardiac monitor.  I personally viewed and interpreted the cardiac monitor which showed an underlying rhythm of:  sinus rhythm and PVCs noted  Medicines ordered and prescription drug management: I ordered medication including Zofran for nausea Reevaluation of the patient after these medicines showed that the patient    improved Critical Interventions:   IV antibiotics for pneumonia  Consultations Obtained: I requested consultation with the admitting physician Dr. Ernesto Rutherford , and discussed  findings as well as pertinent plan - they recommend: Admit  Reevaluation: After the interventions noted above, I reevaluated the patient and found that they have :stayed the same  Complexity of problems addressed: Patient's presentation is most consistent with  acute presentation with potential threat to life or bodily function  Disposition: After consideration of the diagnostic results and the patient's response to treatment,  I feel that the patent would benefit from admission   .           Final Clinical Impression(s) / ED Diagnoses Final diagnoses:  Community acquired pneumonia of right lower lobe of lung  Acute respiratory failure with  hypoxia [facility])    Rx / DC Orders ED Discharge Orders     None         Ripley Fraise, MD 07/18/22 [phone]

## 2022-07-18 NOTE — ED Notes (Signed)
Patient transported to X-ray 

## 2022-07-18 NOTE — H&P (Addendum)
PCP:   Laurey Morale, MD   Chief Complaint: Pneumonia   HPI: This is a 71 year old male with past medical history of diabetes, hypertension, hyperlipidemia, OSA on CPAP, anxiety and depression.  He was admitted 10/20 to 10/21 to the neurosurgery service, he had an L3/4 laminectomy.  Postoperatively he had nausea and vomiting, he went home and his nausea and vomiting continued.  He went on to have a low-grade fever.  A [age] point-year-old was fell on landed on his bottom.  His wife decided to bring him to the ER.  In the ER patient is Tmax 102.8.  He denies shortness of breath or chills.   The patient does not recall much of what occurred yesterday.  Per patient he never left the hospital and was transferred here directly from short stay.  He states he has no pain when still, but states he has back pain when he attempts to get up or walk  Review of Systems:  The patient denies anorexia, fever, weight loss,, vision loss, decreased hearing, hoarseness, chest pain, syncope, dyspnea on exertion, peripheral edema, balance deficits, hemoptysis, abdominal pain, melena, hematochezia, severe indigestion/heartburn, hematuria, incontinence, genital sores, muscle weakness, suspicious skin lesions, transient blindness, difficulty walking, depression, unusual weight change, abnormal bleeding, enlarged lymph nodes, angioedema, and breast masses. Positive: Nausea, vomiting, fever, back pain  Past Medical History: Past Medical History:  Diagnosis Date   Anxiety    on meds   Arthritis    generalized   Depression    on meds   Diabetes mellitus    Type II-on meds   GERD (gastroesophageal reflux disease)    Headache    Heart murmur    slight murmur noted per pt   Hypertension    on meds   Insomnia    Sleep apnea, obstructive    wears a CPAP   Symptomatic cholelithiasis    Past Surgical History:  Procedure Laterality Date   CHOLECYSTECTOMY N/A 03/28/2018   Procedure: LAPAROSCOPIC CHOLECYSTECTOMY  WITH INTRAOPERATIVE CHOLANGIOGRAM;  Surgeon: [name], MD;  Location: [facility];  Service: General;  Laterality: N/A;   COLONOSCOPY  11/20/2013   per Dr. Fuller Plan, adenomatous polyps x 2, repeat in 5 yrs  (movi(prep good)   CYSTECTOMY Right 2014   right wrist   [facility] CRYOTHERAPY      Medications: Prior to Admission medications   Medication Sig Start Date End Date Taking? Authorizing Provider  acetaminophen (TYLENOL) 500 MG tablet Take 1,000 mg by mouth as needed for moderate pain or headache.    [provider]  amLODipine-olmesartan (AZOR) 10-40 MG tablet Take 1 tablet by mouth daily. 08/23/21   Laurey Morale, MD  aspirin 81 MG tablet Take 81 mg by mouth daily.    [provider]  atorvastatin (LIPITOR) 20 MG tablet Take 1 tablet (20 mg total) by mouth daily. 05/14/22 05/09/23  Minus Breeding, MD  baclofen 5 MG TABS Take 5 mg by mouth 3 (three) times daily as needed (hiccoughs or muscle spasms). 07/17/22   Judith Part, MD  Cholecalciferol (VITAMIN D-3) 125 MCG (5000 UT) TABS Take 5,000 Units by mouth daily.    [provider]  cyanocobalamin (VITAMIN B12) 1000 MCG tablet Take 1,000 mcg by mouth daily.    [provider]  diclofenac (VOLTAREN) 75 MG EC tablet TAKE 1 TABLET(75 MG) BY MOUTH TWICE DAILY 07/09/22   Laurey Morale, MD  esomeprazole (NEXIUM) 40 MG capsule  TAKE 1 CAPSULE(40 MG) BY MOUTH DAILY 07/09/22   Laurey Morale, MD  glipiZIDE (GLUCOTROL) 10 MG tablet TAKE 1 TABLET(10 MG) BY MOUTH TWICE DAILY BEFORE A MEAL 07/09/22   Isaac Bliss, Rayford Halsted, MD  glucosamine-chondroitin 500-400 MG tablet Take 2 tablets by mouth daily.    [provider]  glucose blood (ONE TOUCH ULTRA TEST) test strip Once a day 07/20/15   Laurey Morale, MD  hydrochlorothiazide (HYDRODIURIL) 25 MG tablet TAKE 1 TABLET(25 MG) BY MOUTH DAILY Patient taking differently: Take 25 mg by mouth daily. 05/25/22   Laurey Morale, MD   Lancets [facility] ULTRASOFT) lancets Once a day 07/20/15   Laurey Morale, MD  metFORMIN (GLUCOPHAGE) 500 MG tablet TAKE 1 TABLET(500 MG) BY MOUTH TWICE DAILY WITH A MEAL 07/05/22   Laurey Morale, MD  metoprolol succinate (TOPROL-XL) 100 MG 24 hr tablet Take 1 tablet (100 mg total) by mouth daily. Take with or immediately following a meal. Patient taking differently: Take 100 mg by mouth daily. 08/23/21   Laurey Morale, MD  ondansetron (ZOFRAN) 4 MG tablet Take 1 tablet (4 mg total) by mouth every 6 (six) hours as needed for nausea or vomiting. 07/17/22   Judith Part, MD  oxyCODONE (OXY IR/ROXICODONE) 5 MG immediate release tablet Take 1 tablet (5 mg total) by mouth every 4 (four) hours as needed (pain). 07/17/22   Judith Part, MD  sertraline (ZOLOFT) 50 MG tablet TAKE 1 TABLET(50 MG) BY MOUTH DAILY Patient taking differently: Take 50 mg by mouth daily. 06/08/22   Laurey Morale, MD  Soft Lens Products (SENSITIVE EYES SALINE) SOLN Place 2-3 drops into both eyes daily as needed (to clear eyes).    [provider]  temazepam (RESTORIL) 30 MG capsule TAKE 1 CAPSULE(30 MG) BY MOUTH AT BEDTIME Patient taking differently: Take 30 mg by mouth at bedtime as needed for sleep. 03/13/22   Laurey Morale, MD    Allergies:  No Known Allergies  Social History:  reports that he has never smoked. He has never used smokeless tobacco. He reports that he does not currently use alcohol. He reports that he does not use drugs.  Family History: Family History  Problem Relation Age of Onset   Hypertension Mother    Hypertension Father    Heart attack Maternal Grandfather 83       Died suddenly "family lore suggested an MI"   Colon cancer Neg Hx    Stomach cancer Neg Hx    Colon polyps Neg Hx    Esophageal cancer Neg Hx    Rectal cancer Neg Hx     Physical Exam: Vitals:   07/18/22 0330 07/18/22 0330 07/18/22 0345 07/18/22 0430  BP: 111/67  113/69 114/73  Pulse: 74  71 68   Resp: '16  15 14  '$ Temp:  (!) 100.6 F (38.1 C)    TempSrc:  Rectal    SpO2: 100%  97% 97%    General:  Alert and oriented times three, well developed and nourished, no acute distress, weak Eyes: PERRLA, pink conjunctiva, no scleral icterus ENT: Moist oral mucosa, neck supple, no thyromegaly Lungs: clear to ascultation, no wheeze, no crackles, no use of accessory muscles Cardiovascular: regular rate and rhythm, no regurgitation, no gallops, no murmurs. No carotid bruits, no JVD Abdomen: soft, positive BS, non-tender, non-distended, no organomegaly, not an acute abdomen GU: not examined Neuro: CN II - XII grossly intact, sensation intact Musculoskeletal: strength 5/5  all extremities,  LS spine surgical site bandaged. Skin: no rash, no subcutaneous crepitation, no decubitus Psych: appropriate patient   Labs on Admission:  Recent Labs    07/18/22 0002  NA 133*  K 3.6  CL 97*  CO2 23  GLUCOSE 210*  BUN 36*  CREATININE 2.04*  CALCIUM 8.9   Recent Labs    07/18/22 0002  AST 49*  ALT 18  ALKPHOS 35*  BILITOT 1.5*  PROT 6.1*  ALBUMIN 3.3*   No results for input(s): "LIPASE", "AMYLASE" in the last 72 hours. Recent Labs    07/18/22 0002  WBC 10.0  NEUTROABS 9.1*  HGB 12.4*  HCT 34.5*  MCV 88.2  PLT 136*    Micro Results: Recent Results (from the past 240 hour(s))  Resp Panel by RT-PCR (Flu A&B, Covid) Anterior Nasal Swab     Status: None   Collection Time: 07/18/22 12:08 AM   Specimen: Anterior Nasal Swab  Result Value Ref Range Status   SARS Coronavirus 2 by RT PCR NEGATIVE NEGATIVE Final    Comment: (NOTE) SARS-CoV-2 target nucleic acids are NOT DETECTED.  The SARS-CoV-2 RNA is generally detectable in upper respiratory specimens during the acute phase of infection. The lowest concentration of SARS-CoV-2 viral copies this assay can detect is 138 copies/mL. A negative result does not preclude SARS-Cov-2 infection and should not be used as the sole basis for  treatment or other patient management decisions. A negative result may occur with  improper specimen collection/handling, submission of specimen other than nasopharyngeal swab, presence of viral mutation(s) within the areas targeted by this assay, and inadequate number of viral copies(<138 copies/mL). A negative result must be combined with clinical observations, patient history, and epidemiological information. The expected result is Negative.  Fact Sheet for Patients:  EntrepreneurPulse.com.au  Fact Sheet for Healthcare Providers:  IncredibleEmployment.be  This test is no t yet approved or cleared by the [place] FDA and  has been authorized for detection and/or diagnosis of SARS-CoV-2 by FDA under an Emergency Use Authorization (EUA). This EUA will remain  in effect (meaning this test can be used) for the duration of the COVID-19 declaration under Section 564(b)(1) of the Act, 21 U.S.C.section 360bbb-3(b)(1), unless the authorization is terminated  or revoked sooner.       Influenza A by PCR NEGATIVE NEGATIVE Final   Influenza B by PCR NEGATIVE NEGATIVE Final    Comment: (NOTE) The Xpert Xpress SARS-CoV-2/FLU/RSV plus assay is intended as an aid in the diagnosis of influenza from Nasopharyngeal swab specimens and should not be used as a sole basis for treatment. Nasal washings and aspirates are unacceptable for Xpert Xpress SARS-CoV-2/FLU/RSV testing.  Fact Sheet for Patients: EntrepreneurPulse.com.au  Fact Sheet for Healthcare Providers: IncredibleEmployment.be  This test is not yet approved or cleared by the [place] FDA and has been authorized for detection and/or diagnosis of SARS-CoV-2 by FDA under an Emergency Use Authorization (EUA). This EUA will remain in effect (meaning this test can be used) for the duration of the COVID-19 declaration under Section 564(b)(1) of the Act, 21  U.S.C. section 360bbb-3(b)(1), unless the authorization is terminated or revoked.  Performed at [facility], [place] [address], [place], [place] [address]      Radiological Exams on Admission: CT Chest Wo Contrast  Result Date: 07/18/2022 CLINICAL DATA:  71 year old male with weakness, fall, respiratory illness. Dizziness. EXAM: CT CHEST WITHOUT CONTRAST TECHNIQUE: Multidetector CT imaging of the chest was performed following the standard protocol  without IV contrast. RADIATION DOSE REDUCTION: This exam was performed according to the departmental dose-optimization program which includes automated exposure control, adjustment of the mA and/or kV according to patient size and/or use of iterative reconstruction technique. COMPARISON:  Chest radiographs 0023 hours today. FINDINGS: Cardiovascular: Calcified aortic atherosclerosis. Calcified coronary artery atherosclerosis and/or stents is extensive. Cardiac size remains within normal limits. No pericardial effusion. Vascular patency is not evaluated in the absence of IV contrast. Mediastinum/Nodes: No mediastinal mass or lymphadenopathy identified on this noncontrast exam. Lungs/Pleura: Major airways are patent, trace retained secretions along the right lateral wall of the subglottic trachea. Somewhat low lung volumes. Patchy and confluent right lower lobe peribronchial opacity with elevated right hemidiaphragm. This right lower lobe opacity is somewhat greater than that typical of atelectasis. Superimposed trace layering pleural effusions. Patchy opacity in the left costophrenic angle. Elsewhere ventilation is within normal limits. There is some upper lobe subpleural scarring suspected. Upper Abdomen: Elevated right hemidiaphragm. Subjacent noncontrast liver appears negative. Gallbladder not visible, might be surgically absent. Negative visible noncontrast spleen, and bowel in the upper abdomen. No upper abdominal free air free fluid. Musculoskeletal:  No acute or suspicious osseous lesion identified. IMPRESSION: 1. Low lung volumes with elevated right hemidiaphragm, right lower lobe peribronchial opacity, and mild opacity in the left costophrenic angle. Superimposed trace bilateral pleural effusions. The appearance is indeterminate for atelectasis versus Bronchopneumonia. 2. Extensive calcified coronary artery atherosclerosis and/or stents. Aortic Atherosclerosis (ICD10-I70.0). Electronically Signed   By: Genevie Ann M.D.   On: 07/18/2022 05:15   DG Lumbar Spine Complete  Result Date: 07/18/2022 CLINICAL DATA:  Weakness, fall. EXAM: LUMBAR SPINE - COMPLETE 4+ VIEW COMPARISON:  07/16/2022. FINDINGS: There is no evidence of acute lumbar spine fracture. Alignment is normal. Lumbar spinal fusion hardware is noted at L3-L4. A transitional vertebral body is present at S1. Intervertebral disc space narrowing and degenerative endplate changes are noted at L4-L5 and L5-S1. Facet arthropathy is present in the lower lumbar spine. Surgical clips are present in the right upper quadrant. Atherosclerotic calcification of the aorta is noted. IMPRESSION: 1. No acute fracture. 2. Lumbar spinal fusion hardware at L3-L4. 3. Degenerative changes in the lower lumbar spine. Electronically Signed   By: Brett Fairy M.D.   On: 07/18/2022 00:39   DG Chest 2 View  Result Date: 07/18/2022 CLINICAL DATA:  Weakness, fall. EXAM: CHEST - 2 VIEW COMPARISON:  None Available. FINDINGS: The heart size and mediastinal contours are within normal limits. There is atherosclerotic calcification of the aorta. Lung volumes are low. No consolidation, effusion, or pneumothorax. Degenerative changes are present in the thoracic spine. No acute osseous abnormality. IMPRESSION: No active cardiopulmonary disease. Electronically Signed   By: Brett Fairy M.D.   On: 07/18/2022 00:36   DG Lumbar Spine 1 View  Result Date: 07/16/2022 CLINICAL DATA:  Elective surgery, intraop imaging for L3-L4 hardware  check EXAM: LUMBAR SPINE - 1 VIEW COMPARISON:  None Available. FINDINGS: Single lateral intraoperative radiograph. Postsurgical changes of L3-L4 anterior and posterior fusion. No target where. No evidence of immediate complication. Moderate degenerative disc disease at L4-L5 and L5-S1 with trace anterolisthesis at L4-L5. Lower lumbar facet arthropathy. IMPRESSION: Postsurgical changes of anterior and posterior fusion at L3-L4. No evidence of immediate complication. Electronically Signed   By: Maurine Simmering M.D.   On: 07/16/2022 11:45   DG O-ARM IMAGE ONLY/NO REPORT  Result Date: 07/16/2022 There is no Radiologist interpretation  for this exam.   Assessment/Plan Present on Admission:  Bronchopneumonia,  possible aspiration pneumonia  Acute respiratory failure with hypoxia (HCC) -Admit to MedSurg -Pneumonia order set initiated -Blood cultures x 2 -IV Rocephin and azithromycin ordered -Oxygen nebulizers as needed  Status post anterior/posterior fusion L3/L4 -Continue as needed pain meds -Continue as needed meds.  PT consult placed -NS contacted by ER physician.  Consult if needed   HYPERTENSION, BENIGN ESSENTIAL -Norvasc, olmesartan, metoprolol, HCTZ   OSA (obstructive sleep apnea -CPaP ordered   CKD (chronic kidney disease) stage 3, GFR 30-59 ml/min (HCC) -Stable, at baseline  Diabetes mellitus type 2 -Sliding scale insulin   GERD -Omeprazole resumed  Hyperlipidemia -Atorvastatin resumed  Anxiety and depression -Zoloft resumed   Durene Dodge 07/18/2022, 5:45 AM

## 2022-07-18 NOTE — ED Notes (Signed)
Patient transported to CT 

## 2022-07-18 NOTE — Progress Notes (Signed)
This is a very pleasant 71 year old gentleman with medical history of diabetes, hypertension, hyperlipidemia who was initially admitted on 06/15/2022 under neurosurgery service and had L3/L4 laminectomy and was discharged on 10/21 and presented back to the emergency department with fever.  Per reports, patient did have some vomiting postoperatively so there is some suspicion of possible aspiration pneumonia.  Chest x-ray also shows possibility of pneumonia however atelectasis also is on the differential diagnosis.  Patient has been started on Rocephin and Zithromax.  I will continue that, follow fever curve, check procalcitonin.  Order incentive spirometry.  Patient has CKD stage IIIb, which is at baseline.

## 2022-07-18 NOTE — Consult Note (Signed)
Neurosurgery Consultation  Reason for Consult: Back pain / post-op Referring Physician: Claria Dice  CC: [facility]  HPI: This is a 71 y.o. man in whom I performed an uncomplicated J6-2 MIS TLIF on 11/20. He did well post-op with expected back pain, resolved radicular pain, only notable for some post-op hiccoughs. He now returns with fairly constant vomiting, has not been able to keep anything down since discharge, also has been febrile at home, no CP/SOB, no diarrhea. His back hurts when he is vomiting, as expected, but no recurrence of the radicular pain, no new weakness / numbness / paresthesias, no issues with the wound.    ROS: A 14 point ROS was performed and is negative except as noted in the HPI.   PMHx:  Past Medical History:  Diagnosis Date   Anxiety    on meds   Arthritis    generalized   Depression    on meds   Diabetes mellitus    Type II-on meds   GERD (gastroesophageal reflux disease)    Headache    Heart murmur    slight murmur noted per pt   Hypertension    on meds   Insomnia    Sleep apnea, obstructive    wears a CPAP   Symptomatic cholelithiasis    FamHx:  Family History  Problem Relation Age of Onset   Hypertension Mother    Hypertension Father    Heart attack Maternal Grandfather 23       Died suddenly "family lore suggested an MI"   Colon cancer Neg Hx    Stomach cancer Neg Hx    Colon polyps Neg Hx    Esophageal cancer Neg Hx    Rectal cancer Neg Hx    SocHx:  reports that he has never smoked. He has never used smokeless tobacco. He reports that he does not currently use alcohol. He reports that he does not use drugs.  Exam: Vital signs in last 24 hours: Temp:  [98.6 F (37 C)-102.8 F (39.3 C)] 98.7 F (37.1 C) (11/22 1130) Pulse Rate:  [62-88] 72 (11/22 1130) Resp:  [10-27] 22 (11/22 1130) BP: (111-141)/(55-78) 139/68 (11/22 1130) SpO2:  [90 %-100 %] 99 % (11/22 1130) General: Awake, alert, lying in bed, vomiting q1-63mn with mainly  bile-tinged vomitus, no coffee ground Head: Normocephalic and atruamatic HEENT: Neck supple Pulmonary: breathing room air comfortably, no evidence of increased work of breathing Cardiac: RRR Psych: affect full and reactive, at his baseline Extremities: Warm and well perfused x4, no lower extremity edema Neuro: Strength 5/5 x4, SILTx4, incisions c/d/I and healing well    Assessment and Plan: 71y.o. man s/p MIS TLIF 2d ago, now returns with refractory N/V. UA with some ketones and protein, Heme+, CMP w/ hyponatremia, presumed hypovolemic, mild AST elevation, dec'd TP, acute on chronic renal disease, CBC with PLT of 136 down from basleine in the 220-240s with some immature granulocytes.  -no acute neurosurgical intervention indicated at this time. I cannot explain the presentation from a neurosurgical standpoint aside from PONV, doubt his post-op oxycodone is causing such a reaction given he hasn't been able to keep it in his system -will continue to follow -please call with any concerns or questions  TJudith Part MD 07/18/22 12:09 PM [facility]

## 2022-07-19 LAB — CBC WITH DIFFERENTIAL/PLATELET
Abs Immature Granulocytes: 0.06 10*3/uL (ref 0.00–0.07)
Basophils Absolute: 0 10*3/uL (ref 0.0–0.1)
Basophils Relative: 0 %
Eosinophils Absolute: 0.1 10*3/uL (ref 0.0–0.5)
Eosinophils Relative: 1 %
HCT: 30.2 % — ABNORMAL LOW (ref 39.0–52.0)
Hemoglobin: 11 g/dL — ABNORMAL LOW (ref 13.0–17.0)
Immature Granulocytes: 1 %
Lymphocytes Relative: 6 %
Lymphs Abs: 0.5 10*3/uL — ABNORMAL LOW (ref 0.7–4.0)
MCH: 31.7 pg (ref 26.0–34.0)
MCHC: 36.4 g/dL — ABNORMAL HIGH (ref 30.0–36.0)
MCV: 87 fL (ref 80.0–100.0)
Monocytes Absolute: 1 10*3/uL (ref 0.1–1.0)
Monocytes Relative: 13 %
Neutro Abs: 6.4 10*3/uL (ref 1.7–7.7)
Neutrophils Relative %: 79 %
Platelets: 119 10*3/uL — ABNORMAL LOW (ref 150–400)
RBC: 3.47 MIL/uL — ABNORMAL LOW (ref 4.22–5.81)
RDW: 12.6 % (ref 11.5–15.5)
WBC: 8.1 10*3/uL (ref 4.0–10.5)
nRBC: 0 % (ref 0.0–0.2)

## 2022-07-19 LAB — BASIC METABOLIC PANEL
Anion gap: 13 (ref 5–15)
BUN: 29 mg/dL — ABNORMAL HIGH (ref 8–23)
CO2: 26 mmol/L (ref 22–32)
Calcium: 8.8 mg/dL — ABNORMAL LOW (ref 8.9–10.3)
Chloride: 95 mmol/L — ABNORMAL LOW (ref 98–111)
Creatinine, Ser: 1.7 mg/dL — ABNORMAL HIGH (ref 0.61–1.24)
GFR, Estimated: 43 mL/min — ABNORMAL LOW (ref >60)
Glucose, Bld: 131 mg/dL — ABNORMAL HIGH (ref 70–99)
Potassium: 3.5 mmol/L (ref 3.5–5.1)
Sodium: 134 mmol/L — ABNORMAL LOW (ref 135–145)

## 2022-07-19 LAB — GLUCOSE, CAPILLARY
Glucose-Capillary: 140 mg/dL — ABNORMAL HIGH (ref 70–99)
Glucose-Capillary: 252 mg/dL — ABNORMAL HIGH (ref 70–99)

## 2022-07-19 LAB — LEGIONELLA PNEUMOPHILA SEROGP 1 UR AG: L. pneumophila Serogp 1 Ur Ag: NEGATIVE

## 2022-07-19 MED ORDER — AMOXICILLIN-POT CLAVULANATE 875-125 MG PO TABS: 1.0000 | ORAL_TABLET | Freq: Two times a day (BID) | ORAL | 0 refills | Status: AC

## 2022-07-19 NOTE — Evaluation (Signed)
Physical Therapy Evaluation Patient Details Name: Justin Herrera MRN: [id number] DOB: December 12, 1950 Today's Date: 07/19/2022  History of Present Illness  70 y/o M admitted 11/22 with vomiting and bronchopneumonia. Admission 11/20-11/21/23 s/p L3-4 TLIF. PMH includes anxiety, arthritis, depression, T2DM, GERD, insomnia, and HTN.  Clinical Impression  Pt with report of fall at home while trying to descend stairs with RW unassisted. Pt verbally educated for stairs with RW with assist, back precautions, transfers and safety. Pt with increased right glute and incisional pain since fall per his report. Pt with decreased strength, transfers, gait and safety who will benefit from acute therapy to maximize mobility, safety and function.      Recommendations for follow up therapy are one component of a multi-disciplinary discharge planning process, led by the attending physician.  Recommendations may be updated based on patient status, additional functional criteria and insurance authorization.  Follow Up Recommendations Home health PT      Assistance Recommended at Discharge Intermittent Supervision/Assistance  Patient can return home with the following  A little help with walking and/or transfers;A little help with bathing/dressing/bathroom;Assistance with cooking/housework;Assist for transportation;Help with stairs or ramp for entrance    Equipment Recommendations None recommended by PT  Recommendations for Other Services       Functional Status Assessment Patient has had a recent decline in their functional status and demonstrates the ability to make significant improvements in function in a reasonable and predictable amount of time.     Precautions / Restrictions Precautions Precautions: Back;Fall Precaution Booklet Issued: Yes (comment) Precaution Comments: Reviewed handouts and transfer sequence Other Brace: no brace needed per MD order      Mobility  Bed Mobility Overal bed  mobility: Needs Assistance Bed Mobility: Sit to Sidelying         Sit to sidelying: Min assist General bed mobility comments: min assist to clear legs to surface with cues for sequence and precautions. Pt on arrival sitting at end of bed with all rails up with education to have rail down/assist prior to attempting mobility    Transfers Overall transfer level: Needs assistance   Transfers: Sit to/from Stand Sit to Stand: Min guard           General transfer comment: pt required elevated bed to stand from EOB and increased time and effort to stand from recliner with armrest. Cues for posture, hand placement and safety    Ambulation/Gait Ambulation/Gait assistance: Min guard Gait Distance (Feet): 225 Feet Assistive device: Rolling walker (2 wheels) Gait Pattern/deviations: Step-through pattern, Decreased stride length   Gait velocity interpretation: 1.31 - 2.62 ft/sec, indicative of limited community ambulator   General Gait Details: reliance on RW with decreased stride and limited activity tolerance  Stairs            Wheelchair Mobility    Modified Rankin (Stroke Patients Only)       Balance Overall balance assessment: Mild deficits observed, not formally tested                                           Pertinent Vitals/Pain Pain Assessment Pain Score: 8  Pain Location: low back and right glute Pain Descriptors / Indicators: Aching, Constant Pain Intervention(s): Limited activity within patient's tolerance, Repositioned, Monitored during session, Premedicated before session    Home Living Family/patient expects to be discharged to:: Private residence Living Arrangements: Spouse/significant other Available  Help at Discharge: Available 24 hours/day Type of Home: House Home Access: Stairs to enter   Entrance Stairs-Number of Steps: 2 Alternate Level Stairs-Number of Steps: flight Home Layout: Two level;Bed/bath upstairs Home  Equipment: Advice worker (2 wheels)      Prior Function Prior Level of Function : Independent/Modified Independent                     Hand Dominance        Extremity/Trunk Assessment   Upper Extremity Assessment Upper Extremity Assessment: Overall WFL for tasks assessed    Lower Extremity Assessment Lower Extremity Assessment: Generalized weakness    Cervical / Trunk Assessment Cervical / Trunk Assessment: Back Surgery  Communication   Communication: No difficulties  Cognition Arousal/Alertness: Awake/alert Behavior During Therapy: WFL for tasks assessed/performed Overall Cognitive Status: Within Functional Limits for tasks assessed                                          General Comments      Exercises     Assessment/Plan    PT Assessment Patient needs continued PT services  PT Problem List Decreased strength;Decreased activity tolerance;Decreased balance;Decreased mobility;Decreased knowledge of use of DME;Decreased safety awareness;Decreased knowledge of precautions;Pain       PT Treatment Interventions DME instruction;Gait training;Stair training;Functional mobility training;Therapeutic activities;Balance training;Patient/family education    PT Goals (Current goals can be found in the Care Plan section)  Acute Rehab PT Goals Patient Stated Goal: return home, decrease pain PT Goal Formulation: With patient Time For Goal Achievement: 08/02/22 Potential to Achieve Goals: Good    Frequency Min 4X/week     Co-evaluation               AM-PAC PT "6 Clicks" Mobility  Outcome Measure Help needed turning from your back to your side while in a flat bed without using bedrails?: A Little Help needed moving from lying on your back to sitting on the side of a flat bed without using bedrails?: A Little Help needed moving to and from a bed to a chair (including a wheelchair)?: A Little Help needed standing up from a chair  using your arms (e.g., wheelchair or bedside chair)?: A Little Help needed to walk in hospital room?: A Little Help needed climbing 3-5 steps with a railing? : A Lot 6 Click Score: 17    End of Session Equipment Utilized During Treatment: Gait belt Activity Tolerance: Patient tolerated treatment well Patient left: in bed;with call bell/phone within reach Nurse Communication: Mobility status PT Visit Diagnosis: Unsteadiness on feet (R26.81);Other abnormalities of gait and mobility (R26.89)    Time: 2956-2130 PT Time Calculation (min) (ACUTE ONLY): 28 min   Charges:   PT Evaluation $PT Eval Moderate Complexity: 1 Mod PT Treatments $Therapeutic Activity: 8-22 mins        Bayard Males, PT Acute Rehabilitation Services Office: [phone]   Lamarr Lulas 07/19/2022, 9:32 AM

## 2022-07-19 NOTE — TOC Transition Note (Signed)
Transition of Care [facility]) - CM/SW Discharge Note   Patient Details  Name: Justin Herrera MRN: [id number] Date of Birth: 26-Dec-1950  Transition of Care [facility] CM/SW Contact:  Tom-Johnson, Renea Ee, RN Phone Number: 07/19/2022, 12:28 PM   Clinical Narrative:     Patient is scheduled for discharge today. Home health referral called in to [facility] and Kerry Dory voiced acceptance, Info on AVS. Patient had no preference with agency. Family to transport at discharge. No further TOC needs noted.      Final next level of care: [place] Barriers to Discharge: Barriers Resolved   Patient Goals and CMS Choice Patient states their goals for this hospitalization and ongoing recovery are:: To return home CMS Medicare.gov Compare Post Acute Care list provided to:: Patient Choice offered to / list presented to : Patient  Discharge Placement                Patient to be transferred to facility by: Family      Discharge Plan and Services                DME Arranged: N/A DME Agency: NA       HH Arranged: NA HH Agency: NA        Social Determinants of Health (SDOH) Interventions Transportation Interventions: Inpatient TOC, Patient Resources (Friends/Family)   Readmission Risk Interventions     No data to display

## 2022-07-19 NOTE — Discharge Summary (Signed)
Physician Discharge Summary  Justin Herrera PZW:[id number] DOB: [birth year] DOA: 07/17/2022  PCP: Laurey Morale, MD  Admit date: 07/17/2022 Discharge date: 07/19/2022 30 Day Unplanned Readmission Risk Score    Flowsheet Row ED to Hosp-Admission (Current) from 07/17/2022 in [facility]  30 Day Unplanned Readmission Risk Score (%) 15.97 Filed at 07/19/2022 0801       This score is the patient's risk of an unplanned readmission within 30 days of being discharged (0 -100%). The score is based on dignosis, age, lab data, medications, orders, and past utilization.   Low:  0-14.9   Medium: 15-21.9   High: 22-29.9   Extreme: 30 and above          Admitted From: Home Disposition: Home  Recommendations for Outpatient Follow-up:  Follow up with PCP in 1-2 weeks Please obtain BMP/CBC in one week Please follow up with your PCP on the following pending results: Unresulted Labs (From admission, onward)     Start     Ordered   07/18/22 0817  Expectorated Sputum Assessment w Gram Stain, Rflx to Resp Cult  Once,   R        07/18/22 0816   07/18/22 0551  Legionella Pneumophila Serogp 1 Ur Ag  Once,   R        07/18/22 0551              Home Health: Yes Equipment/Devices: None  Discharge Condition: Stable to CODE STATUS: Full code Diet recommendation: Cardiac  Subjective: Seen and examined.  Other than expected back pain, he has no complaint, no shortness of breath.  He is happy to go home today.  Brief/Interim Summary: This is a very pleasant 71 year old gentleman with medical history of diabetes, hypertension, hyperlipidemia who was initially admitted on 06/15/2022 under neurosurgery service and had L3/L4 laminectomy and was discharged on 10/21 and presented back to the emergency department with fever.  Per reports, patient did have some vomiting postoperatively so there is some suspicion of possible aspiration pneumonia.  Chest x-ray also shows possibility  of pneumonia, procalcitonin elevated also supports the presumed diagnosis of pneumonia.  He was started on Rocephin and Zithromax, initially he required some oxygen however he has been off of oxygen since this morning and was saturating 98% on room air with exertion.  He has remained afebrile for more than 24 hours, he was also assessed by neurosurgery during this hospitalization and they opined that there are no concerns at the surgical site and patient was cleared by them.  Patient is excited to go home, he is a stable and he will be discharged.  I have prescribed him 5 more days of oral Augmentin.  Resume all home medications and follow-up with neurosurgery if it is planned.  Discharge plan was discussed with patient and/or family member and they verbalized understanding and agreed with it.  Discharge Diagnoses:  Principal Problem:   Bronchopneumonia Active Problems:   Type 2 diabetes mellitus without complication, without long-term current use of insulin (HCC)   HYPERTENSION, BENIGN ESSENTIAL   OSA (obstructive sleep apnea)   Acute respiratory failure with hypoxia (HCC)   Hyperlipidemia   Anxiety and depression   CKD (chronic kidney disease) stage 3, GFR 30-59 ml/min (HCC)   CAP (community acquired pneumonia)   Sepsis ([place])    Discharge Instructions   Allergies as of 07/19/2022   No Known Allergies      Medication List     STOP taking  these medications    cyanocobalamin 1000 MCG tablet Commonly known as: VITAMIN B12   diclofenac 75 MG EC tablet Commonly known as: VOLTAREN   glucosamine-chondroitin 500-400 MG tablet   ondansetron 4 MG tablet Commonly known as: ZOFRAN       TAKE these medications    acetaminophen 500 MG tablet Commonly known as: TYLENOL Take 1,000 mg by mouth as needed for moderate pain or headache.   amLODipine-olmesartan 10-40 MG tablet Commonly known as: AZOR Take 1 tablet by mouth daily.   amoxicillin-clavulanate 875-125 MG  tablet Commonly known as: AUGMENTIN Take 1 tablet by mouth 2 (two) times daily for 5 days.   aspirin 81 MG tablet Take 81 mg by mouth daily.   atorvastatin 20 MG tablet Commonly known as: LIPITOR Take 1 tablet (20 mg total) by mouth daily.   Baclofen 5 MG Tabs Take 5 mg by mouth 3 (three) times daily as needed (hiccoughs or muscle spasms).   esomeprazole 40 MG capsule Commonly known as: NEXIUM TAKE 1 CAPSULE(40 MG) BY MOUTH DAILY What changed: See the new instructions.   glipiZIDE 10 MG tablet Commonly known as: GLUCOTROL TAKE 1 TABLET(10 MG) BY MOUTH TWICE DAILY BEFORE A MEAL What changed: See the new instructions.   glucose blood test strip Commonly known as: ONE TOUCH ULTRA TEST Once a day   hydrochlorothiazide 25 MG tablet Commonly known as: HYDRODIURIL TAKE 1 TABLET(25 MG) BY MOUTH DAILY What changed: See the new instructions.   metFORMIN 500 MG tablet Commonly known as: GLUCOPHAGE TAKE 1 TABLET(500 MG) BY MOUTH TWICE DAILY WITH A MEAL What changed: See the new instructions.   metoprolol succinate 100 MG 24 hr tablet Commonly known as: TOPROL-XL Take 1 tablet (100 mg total) by mouth daily. Take with or immediately following a meal. What changed: additional instructions   onetouch ultrasoft lancets Once a day   oxyCODONE 5 MG immediate release tablet Commonly known as: Oxy IR/ROXICODONE Take 1 tablet (5 mg total) by mouth every 4 (four) hours as needed (pain).   Sensitive Eyes Saline Soln Place 2-3 drops into both eyes daily as needed (to clear eyes).   sertraline 50 MG tablet Commonly known as: ZOLOFT TAKE 1 TABLET(50 MG) BY MOUTH DAILY What changed: See the new instructions.   temazepam 30 MG capsule Commonly known as: RESTORIL TAKE 1 CAPSULE(30 MG) BY MOUTH AT BEDTIME What changed: See the new instructions.        Follow-up Information     Laurey Morale, MD Follow up in 1 week(s).   Specialty: Family Medicine Contact information: [place] [address] [phone]                No Known Allergies  Consultations: Neurosurgery   Procedures/Studies: CT Chest Wo Contrast  Result Date: 07/18/2022 CLINICAL DATA:  71 year old male with weakness, fall, respiratory illness. Dizziness. EXAM: CT CHEST WITHOUT CONTRAST TECHNIQUE: Multidetector CT imaging of the chest was performed following the standard protocol without IV contrast. RADIATION DOSE REDUCTION: This exam was performed according to the departmental dose-optimization program which includes automated exposure control, adjustment of the mA and/or kV according to patient size and/or use of iterative reconstruction technique. COMPARISON:  Chest radiographs 0023 hours today. FINDINGS: Cardiovascular: Calcified aortic atherosclerosis. Calcified coronary artery atherosclerosis and/or stents is extensive. Cardiac size remains within normal limits. No pericardial effusion. Vascular patency is not evaluated in the absence of IV contrast. Mediastinum/Nodes: No mediastinal mass or lymphadenopathy identified on this noncontrast  exam. Lungs/Pleura: Major airways are patent, trace retained secretions along the right lateral wall of the subglottic trachea. Somewhat low lung volumes. Patchy and confluent right lower lobe peribronchial opacity with elevated right hemidiaphragm. This right lower lobe opacity is somewhat greater than that typical of atelectasis. Superimposed trace layering pleural effusions. Patchy opacity in the left costophrenic angle. Elsewhere ventilation is within normal limits. There is some upper lobe subpleural scarring suspected. Upper Abdomen: Elevated right hemidiaphragm. Subjacent noncontrast liver appears negative. Gallbladder not visible, might be surgically absent. Negative visible noncontrast spleen, and bowel in the upper abdomen. No upper abdominal free air free fluid. Musculoskeletal: No acute or suspicious osseous lesion identified.  IMPRESSION: 1. Low lung volumes with elevated right hemidiaphragm, right lower lobe peribronchial opacity, and mild opacity in the left costophrenic angle. Superimposed trace bilateral pleural effusions. The appearance is indeterminate for atelectasis versus Bronchopneumonia. 2. Extensive calcified coronary artery atherosclerosis and/or stents. Aortic Atherosclerosis (ICD10-I70.0). Electronically Signed   By: Genevie Ann M.D.   On: 07/18/2022 05:15   DG Lumbar Spine Complete  Result Date: 07/18/2022 CLINICAL DATA:  Weakness, fall. EXAM: LUMBAR SPINE - COMPLETE 4+ VIEW COMPARISON:  07/16/2022. FINDINGS: There is no evidence of acute lumbar spine fracture. Alignment is normal. Lumbar spinal fusion hardware is noted at L3-L4. A transitional vertebral body is present at S1. Intervertebral disc space narrowing and degenerative endplate changes are noted at L4-L5 and L5-S1. Facet arthropathy is present in the lower lumbar spine. Surgical clips are present in the right upper quadrant. Atherosclerotic calcification of the aorta is noted. IMPRESSION: 1. No acute fracture. 2. Lumbar spinal fusion hardware at L3-L4. 3. Degenerative changes in the lower lumbar spine. Electronically Signed   By: Brett Fairy M.D.   On: 07/18/2022 00:39   DG Chest 2 View  Result Date: 07/18/2022 CLINICAL DATA:  Weakness, fall. EXAM: CHEST - 2 VIEW COMPARISON:  None Available. FINDINGS: The heart size and mediastinal contours are within normal limits. There is atherosclerotic calcification of the aorta. Lung volumes are low. No consolidation, effusion, or pneumothorax. Degenerative changes are present in the thoracic spine. No acute osseous abnormality. IMPRESSION: No active cardiopulmonary disease. Electronically Signed   By: Brett Fairy M.D.   On: 07/18/2022 00:36   DG Lumbar Spine 1 View  Result Date: 07/16/2022 CLINICAL DATA:  Elective surgery, intraop imaging for L3-L4 hardware check EXAM: LUMBAR SPINE - 1 VIEW COMPARISON:  None  Available. FINDINGS: Single lateral intraoperative radiograph. Postsurgical changes of L3-L4 anterior and posterior fusion. No target where. No evidence of immediate complication. Moderate degenerative disc disease at L4-L5 and L5-S1 with trace anterolisthesis at L4-L5. Lower lumbar facet arthropathy. IMPRESSION: Postsurgical changes of anterior and posterior fusion at L3-L4. No evidence of immediate complication. Electronically Signed   By: Maurine Simmering M.D.   On: 07/16/2022 11:45   DG O-ARM IMAGE ONLY/NO REPORT  Result Date: 07/16/2022 There is no Radiologist interpretation  for this exam.    Discharge Exam: Vitals:   07/19/22 0450 07/19/22 0759  BP: (!) 140/70 (!) 127/55  Pulse: 77 68  Resp: 16 18  Temp: 98.5 F (36.9 C)   SpO2: 93% 96%   Vitals:   07/18/22 2013 07/18/22 2248 07/19/22 0450 07/19/22 0759  BP: 125/69  (!) 140/70 (!) 127/55  Pulse: (!) 58  77 68  Resp: '16  16 18  '$ Temp: 98.7 F (37.1 C)  98.5 F (36.9 C)   TempSrc: Oral  Oral   SpO2: 95%  93% 96%  Weight:  104.1 kg    Height:  '5\' 11"'$  (1.803 m)      General: Pt is alert, awake, not in acute distress Cardiovascular: RRR, S1/S2 +, no rubs, no gallops Respiratory: CTA bilaterally, no wheezing, no rhonchi Abdominal: Soft, NT, ND, bowel sounds + Extremities: no edema, no cyanosis    The results of significant diagnostics from this hospitalization (including imaging, microbiology, ancillary and laboratory) are listed below for reference.     Microbiology: Recent Results (from the past 240 hour(s))  Resp Panel by RT-PCR (Flu A&B, Covid) Anterior Nasal Swab     Status: None   Collection Time: 07/18/22 12:08 AM   Specimen: Anterior Nasal Swab  Result Value Ref Range Status   SARS Coronavirus 2 by RT PCR NEGATIVE NEGATIVE Final    Comment: (NOTE) SARS-CoV-2 target nucleic acids are NOT DETECTED.  The SARS-CoV-2 RNA is generally detectable in upper respiratory specimens during the acute phase of infection. The  lowest concentration of SARS-CoV-2 viral copies this assay can detect is 138 copies/mL. A negative result does not preclude SARS-Cov-2 infection and should not be used as the sole basis for treatment or other patient management decisions. A negative result may occur with  improper specimen collection/handling, submission of specimen other than nasopharyngeal swab, presence of viral mutation(s) within the areas targeted by this assay, and inadequate number of viral copies(<138 copies/mL). A negative result must be combined with clinical observations, patient history, and epidemiological information. The expected result is Negative.  Fact Sheet for Patients:  EntrepreneurPulse.com.au  Fact Sheet for Healthcare Providers:  IncredibleEmployment.be  This test is no t yet approved or cleared by the [place] FDA and  has been authorized for detection and/or diagnosis of SARS-CoV-2 by FDA under an Emergency Use Authorization (EUA). This EUA will remain  in effect (meaning this test can be used) for the duration of the COVID-19 declaration under Section 564(b)(1) of the Act, 21 U.S.C.section 360bbb-3(b)(1), unless the authorization is terminated  or revoked sooner.       Influenza A by PCR NEGATIVE NEGATIVE Final   Influenza B by PCR NEGATIVE NEGATIVE Final    Comment: (NOTE) The Xpert Xpress SARS-CoV-2/FLU/RSV plus assay is intended as an aid in the diagnosis of influenza from Nasopharyngeal swab specimens and should not be used as a sole basis for treatment. Nasal washings and aspirates are unacceptable for Xpert Xpress SARS-CoV-2/FLU/RSV testing.  Fact Sheet for Patients: EntrepreneurPulse.com.au  Fact Sheet for Healthcare Providers: IncredibleEmployment.be  This test is not yet approved or cleared by the [place] FDA and has been authorized for detection and/or diagnosis of SARS-CoV-2 by FDA under  an Emergency Use Authorization (EUA). This EUA will remain in effect (meaning this test can be used) for the duration of the COVID-19 declaration under Section 564(b)(1) of the Act, 21 U.S.C. section 360bbb-3(b)(1), unless the authorization is terminated or revoked.  Performed at [facility], [place] [address], [place], [place] [address]      Labs: BNP (last 3 results) No results for input(s): "BNP" in the last 8760 hours. Basic Metabolic Panel: Recent Labs  Lab 07/18/22 0002 07/19/22 0303  NA 133* 134*  K 3.6 3.5  CL 97* 95*  CO2 23 26  GLUCOSE 210* 131*  BUN 36* 29*  CREATININE 2.04* 1.70*  CALCIUM 8.9 8.8*   Liver Function Tests: Recent Labs  Lab 07/18/22 0002  AST 49*  ALT 18  ALKPHOS 35*  BILITOT 1.5*  PROT 6.1*  ALBUMIN  3.3*   No results for input(s): "LIPASE", "AMYLASE" in the last 168 hours. No results for input(s): "AMMONIA" in the last 168 hours. CBC: Recent Labs  Lab 07/18/22 0002 07/19/22 0303  WBC 10.0 8.1  NEUTROABS 9.1* 6.4  HGB 12.4* 11.0*  HCT 34.5* 30.2*  MCV 88.2 87.0  PLT 136* 119*   Cardiac Enzymes: No results for input(s): "CKTOTAL", "CKMB", "CKMBINDEX", "TROPONINI" in the last 168 hours. BNP: Invalid input(s): "POCBNP" CBG: Recent Labs  Lab 07/18/22 0744 07/18/22 1406 07/18/22 1651 07/18/22 2102 07/19/22 0853  GLUCAP 101* 157* 130* 162* 140*   D-Dimer No results for input(s): "DDIMER" in the last 72 hours. Hgb A1c No results for input(s): "HGBA1C" in the last 72 hours. Lipid Profile No results for input(s): "CHOL", "HDL", "LDLCALC", "TRIG", "CHOLHDL", "LDLDIRECT" in the last 72 hours. Thyroid function studies No results for input(s): "TSH", "T4TOTAL", "T3FREE", "THYROIDAB" in the last 72 hours.  Invalid input(s): "FREET3" Anemia work up No results for input(s): "VITAMINB12", "FOLATE", "FERRITIN", "TIBC", "IRON", "RETICCTPCT" in the last 72 hours. Urinalysis    Component Value Date/Time   COLORURINE YELLOW  07/18/2022 2209   APPEARANCEUR HAZY (A) 07/18/2022 2209   LABSPEC 1.020 07/18/2022 2209   PHURINE 5.0 07/18/2022 2209   GLUCOSEU NEGATIVE 07/18/2022 2209   HGBUR MODERATE (A) 07/18/2022 2209   HGBUR negative 03/29/2010 0758   BILIRUBINUR NEGATIVE 07/18/2022 2209   BILIRUBINUR neg 08/15/2018 1515   KETONESUR 5 (A) 07/18/2022 2209   PROTEINUR 30 (A) 07/18/2022 2209   UROBILINOGEN 0.2 08/15/2018 1515   UROBILINOGEN 1.0 03/29/2010 0758   NITRITE NEGATIVE 07/18/2022 2209   LEUKOCYTESUR NEGATIVE 07/18/2022 2209   Sepsis Labs Recent Labs  Lab 07/18/22 0002 07/19/22 0303  WBC 10.0 8.1   Microbiology Recent Results (from the past 240 hour(s))  Resp Panel by RT-PCR (Flu A&B, Covid) Anterior Nasal Swab     Status: None   Collection Time: 07/18/22 12:08 AM   Specimen: Anterior Nasal Swab  Result Value Ref Range Status   SARS Coronavirus 2 by RT PCR NEGATIVE NEGATIVE Final    Comment: (NOTE) SARS-CoV-2 target nucleic acids are NOT DETECTED.  The SARS-CoV-2 RNA is generally detectable in upper respiratory specimens during the acute phase of infection. The lowest concentration of SARS-CoV-2 viral copies this assay can detect is 138 copies/mL. A negative result does not preclude SARS-Cov-2 infection and should not be used as the sole basis for treatment or other patient management decisions. A negative result may occur with  improper specimen collection/handling, submission of specimen other than nasopharyngeal swab, presence of viral mutation(s) within the areas targeted by this assay, and inadequate number of viral copies(<138 copies/mL). A negative result must be combined with clinical observations, patient history, and epidemiological information. The expected result is Negative.  Fact Sheet for Patients:  EntrepreneurPulse.com.au  Fact Sheet for Healthcare Providers:  IncredibleEmployment.be  This test is no t yet approved or cleared by the  [place] FDA and  has been authorized for detection and/or diagnosis of SARS-CoV-2 by FDA under an Emergency Use Authorization (EUA). This EUA will remain  in effect (meaning this test can be used) for the duration of the COVID-19 declaration under Section 564(b)(1) of the Act, 21 U.S.C.section 360bbb-3(b)(1), unless the authorization is terminated  or revoked sooner.       Influenza A by PCR NEGATIVE NEGATIVE Final   Influenza B by PCR NEGATIVE NEGATIVE Final    Comment: (NOTE) The Xpert Xpress SARS-CoV-2/FLU/RSV plus assay is intended as  an aid in the diagnosis of influenza from Nasopharyngeal swab specimens and should not be used as a sole basis for treatment. Nasal washings and aspirates are unacceptable for Xpert Xpress SARS-CoV-2/FLU/RSV testing.  Fact Sheet for Patients: EntrepreneurPulse.com.au  Fact Sheet for Healthcare Providers: IncredibleEmployment.be  This test is not yet approved or cleared by the [place] FDA and has been authorized for detection and/or diagnosis of SARS-CoV-2 by FDA under an Emergency Use Authorization (EUA). This EUA will remain in effect (meaning this test can be used) for the duration of the COVID-19 declaration under Section 564(b)(1) of the Act, 21 U.S.C. section 360bbb-3(b)(1), unless the authorization is terminated or revoked.  Performed at [facility], [place] [address], [place], [place] [address]      Time coordinating discharge: Over 30 minutes  SIGNED:   Darliss Cheney, MD  [facility] 07/19/2022, 11:17 AM *Please note that this is a verbal dictation therefore any spelling or grammatical errors are due to the "[place] [facility]" system interpretation. If 7PM-7AM, please contact night-coverage [email or web address]

## 2022-08-06 ENCOUNTER — Telehealth: Payer: Self-pay | Admitting: Family Medicine

## 2022-08-06 NOTE — Telephone Encounter (Signed)
Left message for patient to call back and schedule Medicare Annual Wellness Visit (AWV) either virtually or in office. Left  my Justin Herrera number [phone]   AWV-I per PAL[place]TO 11/25/17  please schedule with Nurse Health Adviser   45 min for awv-i and in office appointments 30 min for awv-s  phone/virtual appointments

## 2022-08-23 ENCOUNTER — Other Ambulatory Visit: Payer: Self-pay | Admitting: Family Medicine

## 2022-08-24 ENCOUNTER — Other Ambulatory Visit: Payer: Self-pay | Admitting: Family Medicine

## 2022-08-24 NOTE — Telephone Encounter (Signed)
Pt LOV was on 05/07/2022 Last refill was done on 03/13/22 Please advise

## 2022-09-03 DIAGNOSIS — Z0100 Encounter for examination of eyes and vision without abnormal findings: Secondary | ICD-10-CM | POA: Diagnosis not present

## 2022-09-03 DIAGNOSIS — H43813 Vitreous degeneration, bilateral: Secondary | ICD-10-CM | POA: Diagnosis not present

## 2022-09-03 DIAGNOSIS — H33001 Unspecified retinal detachment with retinal break, right eye: Secondary | ICD-10-CM | POA: Diagnosis not present

## 2022-09-03 DIAGNOSIS — E11319 Type 2 diabetes mellitus with unspecified diabetic retinopathy without macular edema: Secondary | ICD-10-CM | POA: Diagnosis not present

## 2022-09-03 DIAGNOSIS — H33003 Unspecified retinal detachment with retinal break, bilateral: Secondary | ICD-10-CM | POA: Diagnosis not present

## 2022-09-03 DIAGNOSIS — H33002 Unspecified retinal detachment with retinal break, left eye: Secondary | ICD-10-CM | POA: Diagnosis not present

## 2022-09-03 LAB — HM DIABETES EYE EXAM

## 2022-09-05 DIAGNOSIS — N1832 Chronic kidney disease, stage 3b: Secondary | ICD-10-CM | POA: Diagnosis not present

## 2022-09-06 ENCOUNTER — Other Ambulatory Visit: Payer: Self-pay | Admitting: Family Medicine

## 2022-09-10 ENCOUNTER — Encounter: Payer: Self-pay | Admitting: *Deleted

## 2022-09-12 DIAGNOSIS — M1990 Unspecified osteoarthritis, unspecified site: Secondary | ICD-10-CM | POA: Diagnosis not present

## 2022-09-12 DIAGNOSIS — I129 Hypertensive chronic kidney disease with stage 1 through stage 4 chronic kidney disease, or unspecified chronic kidney disease: Secondary | ICD-10-CM | POA: Diagnosis not present

## 2022-09-12 DIAGNOSIS — E785 Hyperlipidemia, unspecified: Secondary | ICD-10-CM | POA: Diagnosis not present

## 2022-09-12 DIAGNOSIS — N1832 Chronic kidney disease, stage 3b: Secondary | ICD-10-CM | POA: Diagnosis not present

## 2022-09-12 DIAGNOSIS — E1122 Type 2 diabetes mellitus with diabetic chronic kidney disease: Secondary | ICD-10-CM | POA: Diagnosis not present

## 2022-09-17 ENCOUNTER — Telehealth: Payer: Self-pay | Admitting: Family Medicine

## 2022-09-17 NOTE — Telephone Encounter (Signed)
Left message for patient to call back and schedule Medicare Annual Wellness Visit (AWV) either virtually or in office. Left  my Herbie Drape number [phone]    AWV-I per [place] 11/25/17 please schedule with Nurse Health Adviser   45 min for awv-i  in office appointments 30 min for awv-s & awv-i phone/virtual appointments

## 2022-09-25 DIAGNOSIS — E785 Hyperlipidemia, unspecified: Secondary | ICD-10-CM | POA: Diagnosis not present

## 2022-09-26 LAB — LIPID PANEL
Chol/HDL Ratio: 3.9 ratio (ref 0.0–5.0)
Cholesterol, Total: 102 mg/dL (ref 100–199)
HDL: 26 mg/dL — ABNORMAL LOW (ref >39)
LDL Chol Calc (NIH): 45 mg/dL (ref 0–99)
Triglycerides: 191 mg/dL — ABNORMAL HIGH (ref 0–149)
VLDL Cholesterol Cal: 31 mg/dL (ref 5–40)

## 2022-10-06 ENCOUNTER — Other Ambulatory Visit: Payer: Self-pay | Admitting: Internal Medicine

## 2022-10-06 ENCOUNTER — Other Ambulatory Visit: Payer: Self-pay | Admitting: Family Medicine

## 2022-10-10 ENCOUNTER — Encounter: Payer: Self-pay | Admitting: Family Medicine

## 2022-10-11 ENCOUNTER — Other Ambulatory Visit: Payer: Self-pay

## 2022-10-11 MED ORDER — HYDROCHLOROTHIAZIDE 25 MG PO TABS: 25.0000 mg | ORAL_TABLET | Freq: Every day | ORAL | 0 refills | Status: DC

## 2022-11-02 ENCOUNTER — Encounter: Payer: Self-pay | Admitting: Family Medicine

## 2022-11-02 ENCOUNTER — Ambulatory Visit (INDEPENDENT_AMBULATORY_CARE_PROVIDER_SITE_OTHER): Payer: Medicare PPO | Admitting: Family Medicine

## 2022-11-02 VITALS — BP 118/80 | HR 55 | Temp 98.0°F | Ht 71.0 in | Wt 219.0 lb

## 2022-11-02 DIAGNOSIS — N401 Enlarged prostate with lower urinary tract symptoms: Secondary | ICD-10-CM

## 2022-11-02 DIAGNOSIS — F32A Depression, unspecified: Secondary | ICD-10-CM | POA: Diagnosis not present

## 2022-11-02 DIAGNOSIS — E119 Type 2 diabetes mellitus without complications: Secondary | ICD-10-CM

## 2022-11-02 DIAGNOSIS — K219 Gastro-esophageal reflux disease without esophagitis: Secondary | ICD-10-CM | POA: Diagnosis not present

## 2022-11-02 DIAGNOSIS — N138 Other obstructive and reflux uropathy: Secondary | ICD-10-CM

## 2022-11-02 DIAGNOSIS — E663 Overweight: Secondary | ICD-10-CM

## 2022-11-02 DIAGNOSIS — N1831 Chronic kidney disease, stage 3a: Secondary | ICD-10-CM

## 2022-11-02 DIAGNOSIS — F5101 Primary insomnia: Secondary | ICD-10-CM | POA: Diagnosis not present

## 2022-11-02 DIAGNOSIS — F419 Anxiety disorder, unspecified: Secondary | ICD-10-CM | POA: Diagnosis not present

## 2022-11-02 DIAGNOSIS — E785 Hyperlipidemia, unspecified: Secondary | ICD-10-CM

## 2022-11-02 DIAGNOSIS — M5416 Radiculopathy, lumbar region: Secondary | ICD-10-CM

## 2022-11-02 DIAGNOSIS — I10 Essential (primary) hypertension: Secondary | ICD-10-CM

## 2022-11-02 LAB — LIPID PANEL
Cholesterol: 101 mg/dL (ref 0–200)
HDL: 29.5 mg/dL — ABNORMAL LOW (ref >39.00)
LDL Cholesterol: 39 mg/dL (ref 0–99)
NonHDL: 71.41
Total CHOL/HDL Ratio: 3
Triglycerides: 164 mg/dL — ABNORMAL HIGH (ref 0.0–149.0)
VLDL: 32.8 mg/dL (ref 0.0–40.0)

## 2022-11-02 LAB — BASIC METABOLIC PANEL
BUN: 33 mg/dL — ABNORMAL HIGH (ref 6–23)
CO2: 32 mEq/L (ref 19–32)
Calcium: 10.6 mg/dL — ABNORMAL HIGH (ref 8.4–10.5)
Chloride: 99 mEq/L (ref 96–112)
Creatinine, Ser: 1.61 mg/dL — ABNORMAL HIGH (ref 0.40–1.50)
GFR: 42.95 mL/min — ABNORMAL LOW (ref >60.00)
Glucose, Bld: 113 mg/dL — ABNORMAL HIGH (ref 70–99)
Potassium: 4.6 mEq/L (ref 3.5–5.1)
Sodium: 138 mEq/L (ref 135–145)

## 2022-11-02 LAB — HEPATIC FUNCTION PANEL
ALT: 15 U/L (ref 0–53)
AST: 15 U/L (ref 0–37)
Albumin: 4.3 g/dL (ref 3.5–5.2)
Alkaline Phosphatase: 47 U/L (ref 39–117)
Bilirubin, Direct: 0.1 mg/dL (ref 0.0–0.3)
Total Bilirubin: 0.5 mg/dL (ref 0.2–1.2)
Total Protein: 6.8 g/dL (ref 6.0–8.3)

## 2022-11-02 LAB — CBC WITH DIFFERENTIAL/PLATELET
Basophils Absolute: 0 10*3/uL (ref 0.0–0.1)
Basophils Relative: 0.7 % (ref 0.0–3.0)
Eosinophils Absolute: 0.2 10*3/uL (ref 0.0–0.7)
Eosinophils Relative: 3.3 % (ref 0.0–5.0)
HCT: 42.1 % (ref 39.0–52.0)
Hemoglobin: 14.8 g/dL (ref 13.0–17.0)
Lymphocytes Relative: 25.1 % (ref 12.0–46.0)
Lymphs Abs: 1.7 10*3/uL (ref 0.7–4.0)
MCHC: 35.2 g/dL (ref 30.0–36.0)
MCV: 86.2 fl (ref 78.0–100.0)
Monocytes Absolute: 0.7 10*3/uL (ref 0.1–1.0)
Monocytes Relative: 10.1 % (ref 3.0–12.0)
Neutro Abs: 4.2 10*3/uL (ref 1.4–7.7)
Neutrophils Relative %: 60.8 % (ref 43.0–77.0)
Platelets: 243 10*3/uL (ref 150.0–400.0)
RBC: 4.88 Mil/uL (ref 4.22–5.81)
RDW: 12.9 % (ref 11.5–15.5)
WBC: 6.9 10*3/uL (ref 4.0–10.5)

## 2022-11-02 LAB — HEMOGLOBIN A1C: Hgb A1c MFr Bld: 6.7 % — ABNORMAL HIGH (ref 4.6–6.5)

## 2022-11-02 LAB — PSA: PSA: 0.92 ng/mL (ref 0.10–4.00)

## 2022-11-02 LAB — TSH: TSH: 1.7 u[IU]/mL (ref 0.35–5.50)

## 2022-11-02 NOTE — Progress Notes (Signed)
Subjective:    Patient ID: Justin Herrera, male    DOB: 1951/05/16, 72 y.o.   MRN: [id number]  HPI Here to follow up on issues. He feels well except for his chronic back pain. He met with Dr. Percival Spanish in January, and he felt Shanon Brow was doing well from a cardiologic standpoint. He is seeing Dr. Joylene Grapes for nephrology care, and at his last visit they discussed the risks of taking an NSAID. Shanon Brow says he is miserable if he does not take Diclofenac, so he has cutback to taking this only once a day. He does take a second one sometimes for pain. His GERD and depression with anxiety are stable. He sleeps well. He tries to watch the carbs in his diet.    Review of Systems  Constitutional: Negative.   HENT: Negative.    Eyes: Negative.   Respiratory: Negative.    Cardiovascular: Negative.   Gastrointestinal: Negative.   Genitourinary: Negative.   Musculoskeletal:  Positive for back pain.  Skin: Negative.   Neurological: Negative.   Psychiatric/Behavioral: Negative.         Objective:   Physical Exam Constitutional:      General: He is not in acute distress.    Appearance: Normal appearance. He is well-developed. He is not diaphoretic.  HENT:     Head: Normocephalic and atraumatic.     Right Ear: External ear normal.     Left Ear: External ear normal.     Nose: Nose normal.     Mouth/Throat:     Pharynx: No oropharyngeal exudate.  Eyes:     General: No scleral icterus.       Right eye: No discharge.        Left eye: No discharge.     Conjunctiva/sclera: Conjunctivae normal.     Pupils: Pupils are equal, round, and reactive to light.  Neck:     Thyroid: No thyromegaly.     Vascular: No JVD.     Trachea: No tracheal deviation.  Cardiovascular:     Rate and Rhythm: Normal rate and regular rhythm.     Heart sounds: Normal heart sounds. No murmur heard.    No friction rub. No gallop.  Pulmonary:     Effort: Pulmonary effort is normal. No respiratory distress.     Breath  sounds: Normal breath sounds. No wheezing or rales.  Chest:     Chest wall: No tenderness.  Abdominal:     General: Bowel sounds are normal. There is no distension.     Palpations: Abdomen is soft. There is no mass.     Tenderness: There is no abdominal tenderness. There is no guarding or rebound.  Genitourinary:    Penis: Normal. No tenderness.      Testes: Normal.     Prostate: Normal.     Rectum: Normal. Guaiac result negative.  Musculoskeletal:        General: No tenderness. Normal range of motion.     Cervical back: Neck supple.  Lymphadenopathy:     Cervical: No cervical adenopathy.  Skin:    General: Skin is warm and dry.     Coloration: Skin is not pale.     Findings: No erythema or rash.  Neurological:     Mental Status: He is alert and oriented to person, place, and time.     Cranial Nerves: No cranial nerve deficit.     Motor: No abnormal muscle tone.     Coordination: Coordination normal.  Deep Tendon Reflexes: Reflexes are normal and symmetric. Reflexes normal.  Psychiatric:        Behavior: Behavior normal.        Thought Content: Thought content normal.        Judgment: Judgment normal.           Assessment & Plan:  His GERD, depression with anxiety, and insomnia are well controlled. His HTN and CKD are stable. We agreed that he can continue to take one Diclofenac per day, but if he has breakthrough pain he will use Tylenol. We will get fasting labs to check lipids, an A1c, etc. We spent a total of ( 33  ) minutes reviewing records and discussing these issues.  Alysia Penna, MD

## 2022-11-14 DIAGNOSIS — H903 Sensorineural hearing loss, bilateral: Secondary | ICD-10-CM | POA: Diagnosis not present

## 2022-11-14 DIAGNOSIS — H9313 Tinnitus, bilateral: Secondary | ICD-10-CM | POA: Diagnosis not present

## 2022-11-20 ENCOUNTER — Telehealth: Payer: Self-pay | Admitting: Family Medicine

## 2022-11-20 NOTE — Telephone Encounter (Signed)
Contacted [name] to schedule their annual wellness visit. Will call back  11/20/22  Call back at later date: wcb  Barkley [employer]ards AWV direct phone # [phone]

## 2022-12-05 ENCOUNTER — Other Ambulatory Visit: Payer: Self-pay | Admitting: Family Medicine

## 2022-12-11 DIAGNOSIS — E11319 Type 2 diabetes mellitus with unspecified diabetic retinopathy without macular edema: Secondary | ICD-10-CM | POA: Diagnosis not present

## 2022-12-11 DIAGNOSIS — E119 Type 2 diabetes mellitus without complications: Secondary | ICD-10-CM | POA: Diagnosis not present

## 2022-12-12 DIAGNOSIS — M461 Sacroiliitis, not elsewhere classified: Secondary | ICD-10-CM | POA: Diagnosis not present

## 2022-12-12 DIAGNOSIS — Z6831 Body mass index (BMI) 31.0-31.9, adult: Secondary | ICD-10-CM | POA: Diagnosis not present

## 2022-12-31 ENCOUNTER — Telehealth: Payer: Self-pay | Admitting: Family Medicine

## 2022-12-31 NOTE — Telephone Encounter (Signed)
Called patient to schedule Medicare Annual Wellness Visit (AWV). Left message for patient to call back and schedule Medicare Annual Wellness Visit (AWV).  Last date of AWV:  AWV-I per PA*LMETTO 11/25/17  Please schedule an appointment at any time with [facility] or [employer].  If any questions, please contact me at [phone].  Thank you ,  Rudell Cobb AWV direct phone # [phone]

## 2023-01-03 DIAGNOSIS — M461 Sacroiliitis, not elsewhere classified: Secondary | ICD-10-CM | POA: Diagnosis not present

## 2023-01-04 ENCOUNTER — Other Ambulatory Visit: Payer: Self-pay | Admitting: Family Medicine

## 2023-01-11 ENCOUNTER — Other Ambulatory Visit: Payer: Self-pay | Admitting: Family Medicine

## 2023-03-05 ENCOUNTER — Other Ambulatory Visit: Payer: Self-pay | Admitting: Family Medicine

## 2023-03-12 ENCOUNTER — Other Ambulatory Visit: Payer: Self-pay | Admitting: Family Medicine

## 2023-04-03 DIAGNOSIS — L814 Other melanin hyperpigmentation: Secondary | ICD-10-CM | POA: Diagnosis not present

## 2023-04-03 DIAGNOSIS — L570 Actinic keratosis: Secondary | ICD-10-CM | POA: Diagnosis not present

## 2023-04-03 DIAGNOSIS — L821 Other seborrheic keratosis: Secondary | ICD-10-CM | POA: Diagnosis not present

## 2023-04-03 DIAGNOSIS — D225 Melanocytic nevi of trunk: Secondary | ICD-10-CM | POA: Diagnosis not present

## 2023-04-04 ENCOUNTER — Other Ambulatory Visit: Payer: Self-pay | Admitting: Family Medicine

## 2023-04-10 DIAGNOSIS — Z6831 Body mass index (BMI) 31.0-31.9, adult: Secondary | ICD-10-CM | POA: Diagnosis not present

## 2023-04-10 DIAGNOSIS — M5416 Radiculopathy, lumbar region: Secondary | ICD-10-CM | POA: Diagnosis not present

## 2023-04-15 ENCOUNTER — Ambulatory Visit (INDEPENDENT_AMBULATORY_CARE_PROVIDER_SITE_OTHER): Payer: Medicare PPO

## 2023-04-15 ENCOUNTER — Other Ambulatory Visit: Payer: Self-pay | Admitting: Family Medicine

## 2023-04-15 VITALS — Ht 71.0 in | Wt 219.0 lb

## 2023-04-15 DIAGNOSIS — Z0000 Encounter for general adult medical examination without abnormal findings: Secondary | ICD-10-CM

## 2023-04-15 NOTE — Patient Instructions (Addendum)
Justin Herrera , Thank you for taking time to come for your Medicare Wellness Visit. I appreciate your ongoing commitment to your health goals. Please review the following plan we discussed and let me know if I can assist you in the future.   Referrals/Orders/Follow-Ups/Clinician Recommendations:   This is a list of the screening recommended for you and due dates:  Health Maintenance  Topic Date Due   Complete foot exam   Never done   Yearly kidney health urinalysis for diabetes  07/05/2017   Flu Shot  03/28/2023   COVID-19 Vaccine (3 - 2023-24 season) 11/25/2023*   Hemoglobin A1C  05/05/2023   Pneumonia Vaccine (3 of 3 - PPSV23 or PCV20) 08/16/2023   Eye exam for diabetics  09/04/2023   Yearly kidney function blood test for diabetes  11/02/2023   Medicare Annual Wellness Visit  04/14/2024   Colon Cancer Screening  09/04/2026   Hepatitis C Screening  Completed   Zoster (Shingles) Vaccine  Completed   HPV Vaccine  Aged Out   DTaP/Tdap/Td vaccine  Discontinued  *Topic was postponed. The date shown is not the original due date.    Advanced directives: (Copy Requested) Please bring a copy of your health care power of attorney and living will to the office to be added to your chart at your convenience.  Next Medicare Annual Wellness Visit scheduled for next year: Yes  Preventive Care 47 Years and Older, Male  Preventive care refers to lifestyle choices and visits with your health care provider that can promote health and wellness. What does preventive care include? A yearly physical exam. This is also called an annual well check. Dental exams once or twice a year. Routine eye exams. Ask your health care provider how often you should have your eyes checked. Personal lifestyle choices, including: Daily care of your teeth and gums. Regular physical activity. Eating a healthy diet. Avoiding tobacco and drug use. Limiting alcohol use. Practicing safe sex. Taking low doses of aspirin  every day. Taking vitamin and mineral supplements as recommended by your health care provider. What happens during an annual well check? The services and screenings done by your health care provider during your annual well check will depend on your age, overall health, lifestyle risk factors, and family history of disease. Counseling  Your health care provider may ask you questions about your: Alcohol use. Tobacco use. Drug use. Emotional well-being. Home and relationship well-being. Sexual activity. Eating habits. History of falls. Memory and ability to understand (cognition). Work and work Astronomer. Screening  You may have the following tests or measurements: Height, weight, and BMI. Blood pressure. Lipid and cholesterol levels. These may be checked every 5 years, or more frequently if you are over 74 years old. Skin check. Lung cancer screening. You may have this screening every year starting at age 82 if you have a 30-pack-year history of smoking and currently smoke or have quit within the past 15 years. Fecal occult blood test (FOBT) of the stool. You may have this test every year starting at age 13. Flexible sigmoidoscopy or colonoscopy. You may have a sigmoidoscopy every 5 years or a colonoscopy every 10 years starting at age 67. Prostate cancer screening. Recommendations will vary depending on your family history and other risks. Hepatitis C blood test. Hepatitis B blood test. Sexually transmitted disease (STD) testing. Diabetes screening. This is done by checking your blood sugar (glucose) after you have not eaten for a while (fasting). You may have this done every  1-3 years. Abdominal aortic aneurysm (AAA) screening. You may need this if you are a current or former smoker. Osteoporosis. You may be screened starting at age 34 if you are at high risk. Talk with your health care provider about your test results, treatment options, and if necessary, the need for more  tests. Vaccines  Your health care provider may recommend certain vaccines, such as: Influenza vaccine. This is recommended every year. Tetanus, diphtheria, and acellular pertussis (Tdap, Td) vaccine. You may need a Td booster every 10 years. Zoster vaccine. You may need this after age 41. Pneumococcal 13-valent conjugate (PCV13) vaccine. One dose is recommended after age 82. Pneumococcal polysaccharide (PPSV23) vaccine. One dose is recommended after age 14. Talk to your health care provider about which screenings and vaccines you need and how often you need them. This information is not intended to replace advice given to you by your health care provider. Make sure you discuss any questions you have with your health care provider. Document Released: 09/09/2015 Document Revised: 05/02/2016 Document Reviewed: 06/14/2015 Elsevier Interactive Patient Education  2017 [employer].  Fall Prevention in the Home Falls can cause injuries. They can happen to people of all ages. There are many things you can do to make your home safe and to help prevent falls. What can I do on the outside of my home? Regularly fix the edges of walkways and driveways and fix any cracks. Remove anything that might make you trip as you walk through a door, such as a raised step or threshold. Trim any bushes or trees on the path to your home. Use bright outdoor lighting. Clear any walking paths of anything that might make someone trip, such as rocks or tools. Regularly check to see if handrails are loose or broken. Make sure that both sides of any steps have handrails. Any raised decks and porches should have guardrails on the edges. Have any leaves, snow, or ice cleared regularly. Use sand or salt on walking paths during winter. Clean up any spills in your garage right away. This includes oil or grease spills. What can I do in the bathroom? Use night lights. Install grab bars by the toilet and in the tub and shower.  Do not use towel bars as grab bars. Use non-skid mats or decals in the tub or shower. If you need to sit down in the shower, use a plastic, non-slip stool. Keep the floor dry. Clean up any water that spills on the floor as soon as it happens. Remove soap buildup in the tub or shower regularly. Attach bath mats securely with double-sided non-slip rug tape. Do not have throw rugs and other things on the floor that can make you trip. What can I do in the bedroom? Use night lights. Make sure that you have a light by your bed that is easy to reach. Do not use any sheets or blankets that are too big for your bed. They should not hang down onto the floor. Have a firm chair that has side arms. You can use this for support while you get dressed. Do not have throw rugs and other things on the floor that can make you trip. What can I do in the kitchen? Clean up any spills right away. Avoid walking on wet floors. Keep items that you use a lot in easy-to-reach places. If you need to reach something above you, use a strong step stool that has a grab bar. Keep electrical cords out of the way.  Do not use floor polish or wax that makes floors slippery. If you must use wax, use non-skid floor wax. Do not have throw rugs and other things on the floor that can make you trip. What can I do with my stairs? Do not leave any items on the stairs. Make sure that there are handrails on both sides of the stairs and use them. Fix handrails that are broken or loose. Make sure that handrails are as long as the stairways. Check any carpeting to make sure that it is firmly attached to the stairs. Fix any carpet that is loose or worn. Avoid having throw rugs at the top or bottom of the stairs. If you do have throw rugs, attach them to the floor with carpet tape. Make sure that you have a light switch at the top of the stairs and the bottom of the stairs. If you do not have them, ask someone to add them for you. What else  can I do to help prevent falls? Wear shoes that: Do not have high heels. Have rubber bottoms. Are comfortable and fit you well. Are closed at the toe. Do not wear sandals. If you use a stepladder: Make sure that it is fully opened. Do not climb a closed stepladder. Make sure that both sides of the stepladder are locked into place. Ask someone to hold it for you, if possible. Clearly mark and make sure that you can see: Any grab bars or handrails. First and last steps. Where the edge of each step is. Use tools that help you move around (mobility aids) if they are needed. These include: Canes. Walkers. Scooters. Crutches. Turn on the lights when you go into a dark area. Replace any light bulbs as soon as they burn out. Set up your furniture so you have a clear path. Avoid moving your furniture around. If any of your floors are uneven, fix them. If there are any pets around you, be aware of where they are. Review your medicines with your doctor. Some medicines can make you feel dizzy. This can increase your chance of falling. Ask your doctor what other things that you can do to help prevent falls. This information is not intended to replace advice given to you by your health care provider. Make sure you discuss any questions you have with your health care provider. Document Released: 06/09/2009 Document Revised: 01/19/2016 Document Reviewed: 09/17/2014 Elsevier Interactive Patient Education  2017 [employer].

## 2023-04-15 NOTE — Progress Notes (Signed)
Subjective:   Justin Herrera is a 72 y.o. male who presents for Medicare Annual/Subsequent preventive examination.  Visit Complete: Virtual  I connected with  Frances Nickels on 04/15/23 by a audio enabled telemedicine application and verified that I am speaking with the correct person using two identifiers.  Patient Location: Home  Provider Location: Home Office  I discussed the limitations of evaluation and management by telemedicine. The patient expressed understanding and agreed to proceed.    Review of Systems    Vital Signs: Unable to obtain new vitals due to this being a telehealth visit.  Cardiac Risk Factors include: advanced age (>61men, >24 women);male gender;diabetes mellitus;hypertension     Objective:    Today's Vitals   04/15/23 1534  Weight: 219 lb (99.3 kg)  Height: 5\' 11"  (1.803 m)   Body mass index is 30.54 kg/m.     04/15/2023    3:41 PM 07/18/2022    8:30 PM 07/05/2022    3:13 PM 09/12/2021    4:41 PM 03/26/2018    3:04 PM 11/20/2013    8:25 AM  Advanced Directives  Does Patient Have a Medical Advance Directive? Yes Yes Yes No Yes Patient does not have advance directive  Type of Advance Directive Healthcare Power of [place];Living will Healthcare Power of [place];Living will Healthcare Power of [place];Living will  Living will   Does patient want to make changes to medical advance directive?  No - Patient declined No - Patient declined  No - Patient declined   Copy of Healthcare Power of Attorney in Chart? No - copy requested No - copy requested No - copy requested     Would patient like information on creating a medical advance directive?    No - Patient declined      Current Medications (verified) Outpatient Encounter Medications as of 04/15/2023  Medication Sig   acetaminophen (TYLENOL) 500 MG tablet Take 1,000 mg by mouth as needed for moderate pain or headache.   amLODipine-olmesartan (AZOR) 10-40 MG tablet TAKE 1 TABLET BY MOUTH  DAILY   aspirin 81 MG tablet Take 81 mg by mouth daily.   atorvastatin (LIPITOR) 20 MG tablet Take 1 tablet (20 mg total) by mouth daily.   diclofenac (VOLTAREN) 75 MG EC tablet Take 75 mg by mouth 2 (two) times daily.   esomeprazole (NEXIUM) 40 MG capsule TAKE 1 CAPSULE(40 MG) BY MOUTH DAILY (Patient taking differently: Take 40 mg by mouth daily before breakfast.)   glipiZIDE (GLUCOTROL) 10 MG tablet TAKE 1 TABLET(10 MG) BY MOUTH TWICE DAILY BEFORE A MEAL   glucose blood (ONE TOUCH ULTRA TEST) test strip Once a day   hydrochlorothiazide (HYDRODIURIL) 25 MG tablet TAKE 1 TABLET(25 MG) BY MOUTH DAILY   Lancets (ONETOUCH ULTRASOFT) lancets Once a day   metFORMIN (GLUCOPHAGE) 500 MG tablet TAKE 1 TABLET(500 MG) BY MOUTH TWICE DAILY WITH A MEAL (Patient taking differently: Take 500 mg by mouth in the morning and at bedtime.)   metoprolol succinate (TOPROL-XL) 100 MG 24 hr tablet TAKE 1 TABLET(100 MG) BY MOUTH DAILY WITH OR IMMEDIATELY FOLLOWING A MEAL   sertraline (ZOLOFT) 50 MG tablet TAKE 1 TABLET(50 MG) BY MOUTH DAILY   Soft Lens Products (SENSITIVE EYES SALINE) SOLN Place 2-3 drops into both eyes daily as needed (to clear eyes).   temazepam (RESTORIL) 30 MG capsule TAKE 1 CAPSULE(30 MG) BY MOUTH AT BEDTIME   No facility-administered encounter medications on file as of 04/15/2023.    Allergies (verified) Patient has  no known allergies.   History: Past Medical History:  Diagnosis Date   Anxiety    on meds   Arthritis    generalized   Depression    on meds   Diabetes mellitus    Type II-on meds   GERD (gastroesophageal reflux disease)    Headache    Heart murmur    slight murmur noted per pt   Hypertension    on meds   Insomnia    Sleep apnea, obstructive    wears a CPAP   Symptomatic cholelithiasis    Past Surgical History:  Procedure Laterality Date   CHOLECYSTECTOMY N/A 03/28/2018   Procedure: LAPAROSCOPIC CHOLECYSTECTOMY WITH INTRAOPERATIVE CHOLANGIOGRAM;  Surgeon:  Gaynelle Adu, MD;  Location: [facility] OR;  Service: General;  Laterality: N/A;   COLONOSCOPY  09/04/2021   per Dr. Russella Dar, adenomatous polyps, repeat in 5 yrs  (movi(prep good)   CYSTECTOMY Right 2014   right wrist   KNEE SURGERY  1968   RETINAL TEAR REPAIR CRYOTHERAPY     TRANSFORAMINAL LUMBAR INTERBODY FUSION W/ MIS 1 LEVEL Right 07/16/2022   Procedure: Right Lumbar three-four MinimallyInvasive Surgery Decompression/Transforaminal Lumbar Interbody Fusion;  Surgeon: Jadene Pierini, MD;  Location: [facility] OR;  Service: Neurosurgery;  Laterality: Right;  RM 21/3C   Family History  Problem Relation Age of Onset   Hypertension Mother    Hypertension Father    Heart attack Maternal Grandfather 71       Died suddenly "family lore suggested an MI"   Colon cancer Neg Hx    Stomach cancer Neg Hx    Colon polyps Neg Hx    Esophageal cancer Neg Hx    Rectal cancer Neg Hx    Social History   Socioeconomic History   Marital status: Married    Spouse name: Not on file   Number of children: 0   Years of education: Not on file   Highest education level: Not on file  Occupational History   Occupation: Art gallery manager   Occupation: Emergency planning/management officer    Comment: Retired  Tobacco Use   Smoking status: Never   Smokeless tobacco: Never  Vaping Use   Vaping status: Never Used  Substance and Sexual Activity   Alcohol use: Not Currently    Alcohol/week: 0.0 - 1.0 standard drinks of alcohol    Comment: once per month   Drug use: No   Sexual activity: Not on file  Other Topics Concern   Not on file  Social History Narrative   Lives at home with wife and four dogs.    Social Determinants of Health   Financial Resource Strain: Low Risk  (04/15/2023)   Overall Financial Resource Strain (CARDIA)    Difficulty of Paying Living Expenses: Not hard at all  Food Insecurity: No Food Insecurity (04/15/2023)   Hunger Vital Sign    Worried About Running Out of Food in the Last Year: Never true    Ran Out of Food in  the Last Year: Never true  Transportation Needs: No Transportation Needs (04/15/2023)   PRAPARE - Administrator, Civil Service (Medical): No    Lack of Transportation (Non-Medical): No  Physical Activity: Sufficiently Active (04/15/2023)   Exercise Vital Sign    Days of Exercise per Week: 7 days    Minutes of Exercise per Session: 30 min  Stress: No Stress Concern Present (04/15/2023)   [employer] of Occupational Health - Occupational Stress Questionnaire    Feeling of Stress :  Not at all  Social Connections: Moderately Isolated (04/15/2023)   Social Connection and Isolation Panel [NHANES]    Frequency of Communication with Friends and Family: More than three times a week    Frequency of Social Gatherings with Friends and Family: More than three times a week    Attends Religious Services: Never    Database administrator or Organizations: No    Attends Engineer, structural: Never    Marital Status: Married    Tobacco Counseling Counseling given: Not Answered   Clinical Intake:  Pre-visit preparation completed: Yes  Pain : No/denies pain     BMI - recorded: 30.54 Nutritional Status: BMI > 30  Obese Nutritional Risks: None Diabetes: Yes CBG done?: No Did pt. bring in CBG monitor from home?: No  How often do you need to have someone help you when you read instructions, pamphlets, or other written materials from your doctor or pharmacy?: 1 - Never  Interpreter Needed?: No  Information entered by :: Theresa Mulligan LPN   Activities of Daily Living    04/15/2023    3:40 PM 07/18/2022    8:30 PM  In your present state of health, do you have any difficulty performing the following activities:  Hearing? 0 1  Vision? 0 0  Difficulty concentrating or making decisions? 0 0  Walking or climbing stairs? 0 1  Dressing or bathing? 0 0  Doing errands, shopping? 0 0  Preparing Food and eating ? N   Using the Toilet? N   In the past six months, have you  accidently leaked urine? N   Do you have problems with loss of bowel control? N   Managing your Medications? N   Managing your Finances? N   Housekeeping or managing your Housekeeping? N     Patient Care Team: Nelwyn Salisbury, MD as PCP - General (Family Medicine) Rollene Rotunda, MD as PCP - Cardiology (Cardiology)  Indicate any recent Medical Services you may have received from other than [facility] providers in the past year (date may be approximate).     Assessment:   This is a routine wellness examination for [place].  Hearing/Vision screen Hearing Screening - Comments:: Denies hearing difficulties   Vision Screening - Comments:: Wears rx glasses - up to date with routine eye exams with  [facility]  Dietary issues and exercise activities discussed:     Goals Addressed               This Visit's Progress     Get back to fishing (pt-stated)        Trout fishing in [place].       Depression Screen    04/15/2023    3:39 PM 11/02/2022   10:35 AM 05/07/2022    4:07 PM 08/23/2021    3:28 PM 08/23/2020    8:03 AM 08/15/2018    1:35 PM 08/14/2017    1:28 PM  PHQ 2/9 Scores  PHQ - 2 Score 0 0 1 1 2  0 0  PHQ- 9 Score  0 3 4 7       Fall Risk    04/15/2023    3:40 PM 11/02/2022   10:35 AM 05/07/2022    4:07 PM 08/23/2021    3:29 PM 08/15/2018    1:35 PM  Fall Risk   Falls in the past year? 0 0 0 1 0  Number falls in past yr: 0 0 0 1   Injury with Fall?  0 0 0 0   Risk for fall due to : No Fall Risks No Fall Risks No Fall Risks No Fall Risks   Follow up Falls prevention discussed Falls evaluation completed Falls evaluation completed Falls evaluation completed     MEDICARE RISK AT HOME: Medicare Risk at Home Any stairs in or around the home?: Yes If so, are there any without handrails?: No Home free of loose throw rugs in walkways, pet beds, electrical cords, etc?: Yes Adequate lighting in your home to reduce risk of falls?: Yes Life alert?: No Use of a  cane, walker or w/c?: No Grab bars in the bathroom?: Yes Shower chair or bench in shower?: Yes Elevated toilet seat or a handicapped toilet?: No  TIMED UP AND GO:  Was the test performed?  No    Cognitive Function:        04/15/2023    3:42 PM  6CIT Screen  What Year? 0 points  What month? 0 points  What time? 0 points  Count back from 20 0 points  Months in reverse 0 points  Repeat phrase 0 points  Total Score 0 points    Immunizations Immunization History  Administered Date(s) Administered   Fluad Quad(high Dose 65+) 05/25/2020, 06/07/2021, 05/30/2022   H1N1 09/05/2008   Influenza Split 07/28/2012   Influenza Whole 05/22/2009   Influenza, High Dose Seasonal PF 06/05/2017, 05/30/2018   Influenza,inj,Quad PF,6+ Mos 05/08/2013, 06/03/2014, 07/05/2015, 07/31/2016   PFIZER(Purple Top)SARS-COV-2 Vaccination 10/04/2019, 10/28/2019   Pneumococcal Conjugate-13 08/15/2018   Pneumococcal Polysaccharide-23 04/24/2012   Td 02/18/2008   Tdap 04/24/2012   Zoster Recombinant(Shingrix) 06/26/2019   Zoster, Live 12/11/2012      Flu Vaccine status: Due, Education has been provided regarding the importance of this vaccine. Advised may receive this vaccine at local pharmacy or Health Dept. Aware to provide a copy of the vaccination record if obtained from local pharmacy or Health Dept. Verbalized acceptance and understanding.  Pneumococcal vaccine status: Up to date  Covid-19 vaccine status: Declined, Education has been provided regarding the importance of this vaccine but patient still declined. Advised may receive this vaccine at local pharmacy or Health Dept.or vaccine clinic. Aware to provide a copy of the vaccination record if obtained from local pharmacy or Health Dept. Verbalized acceptance and understanding.  Qualifies for Shingles Vaccine? Yes   Zostavax completed Yes   Shingrix Completed?: Yes  Screening Tests Health Maintenance  Topic Date Due   FOOT EXAM  Never done    Diabetic kidney evaluation - Urine ACR  07/05/2017   INFLUENZA VACCINE  03/28/2023   COVID-19 Vaccine (3 - 2023-24 season) 11/25/2023 (Originally 04/27/2022)   HEMOGLOBIN A1C  05/05/2023   Pneumonia Vaccine 19+ Years old (3 of 3 - PPSV23 or PCV20) 08/16/2023   OPHTHALMOLOGY EXAM  09/04/2023   Diabetic kidney evaluation - eGFR measurement  11/02/2023   Medicare Annual Wellness (AWV)  04/14/2024   Colonoscopy  09/04/2026   Hepatitis C Screening  Completed   Zoster Vaccines- Shingrix  Completed   HPV VACCINES  Aged Out   DTaP/Tdap/Td  Discontinued    Health Maintenance  Health Maintenance Due  Topic Date Due   FOOT EXAM  Never done   Diabetic kidney evaluation - Urine ACR  07/05/2017   INFLUENZA VACCINE  03/28/2023    Colorectal cancer screening: Type of screening: Colonoscopy. Completed 09/04/21. Repeat every 5 years  Lung Cancer Screening: (Low Dose CT Chest recommended if Age 81-80 years, 20 pack-year currently smoking OR  have quit w/in 15years.) does not qualify.     Additional Screening:  Hepatitis C Screening: does qualify; Completed 05/30/22  Vision Screening: Recommended annual ophthalmology exams for early detection of glaucoma and other disorders of the eye. Is the patient up to date with their annual eye exam?  Yes  Who is the provider or what is the name of the office in which the patient attends annual eye exams? [facility] If pt is not established with a provider, would they like to be referred to a provider to establish care? No .   Dental Screening: Recommended annual dental exams for proper oral hygiene  Diabetic Foot Exam: Diabetic Foot Exam: Overdue, Pt has been advised about the importance in completing this exam. Pt is scheduled for diabetic foot exam on Followed by PCP.  Community Resource Referral / Chronic Care Management:  CRR required this visit?  No   CCM required this visit?  No     Plan:     I have personally reviewed and noted the  following in the patient's chart:   Medical and social history Use of alcohol, tobacco or illicit drugs  Current medications and supplements including opioid prescriptions. Patient is not currently taking opioid prescriptions. Functional ability and status Nutritional status Physical activity Advanced directives List of other physicians Hospitalizations, surgeries, and ER visits in previous 12 months Vitals Screenings to include cognitive, depression, and falls Referrals and appointments  In addition, I have reviewed and discussed with patient certain preventive protocols, quality metrics, and best practice recommendations. A written personalized care plan for preventive services as well as general preventive health recommendations were provided to patient.     Tillie Rung, LPN   [id number]/[id number]   After Visit Summary: (MyChart) Due to this being a telephonic visit, the after visit summary with patients personalized plan was offered to patient via MyChart   Nurse Notes: Patient due Diabetic Kidney evaluation ACR

## 2023-04-18 DIAGNOSIS — M4727 Other spondylosis with radiculopathy, lumbosacral region: Secondary | ICD-10-CM | POA: Diagnosis not present

## 2023-04-18 DIAGNOSIS — M48061 Spinal stenosis, lumbar region without neurogenic claudication: Secondary | ICD-10-CM | POA: Diagnosis not present

## 2023-04-18 DIAGNOSIS — M4807 Spinal stenosis, lumbosacral region: Secondary | ICD-10-CM | POA: Diagnosis not present

## 2023-04-18 DIAGNOSIS — M4726 Other spondylosis with radiculopathy, lumbar region: Secondary | ICD-10-CM | POA: Diagnosis not present

## 2023-04-18 DIAGNOSIS — M5116 Intervertebral disc disorders with radiculopathy, lumbar region: Secondary | ICD-10-CM | POA: Diagnosis not present

## 2023-05-08 DIAGNOSIS — M48062 Spinal stenosis, lumbar region with neurogenic claudication: Secondary | ICD-10-CM | POA: Diagnosis not present

## 2023-05-08 DIAGNOSIS — Z6834 Body mass index (BMI) 34.0-34.9, adult: Secondary | ICD-10-CM | POA: Diagnosis not present

## 2023-05-08 DIAGNOSIS — M7138 Other bursal cyst, other site: Secondary | ICD-10-CM | POA: Diagnosis not present

## 2023-05-10 ENCOUNTER — Ambulatory Visit (INDEPENDENT_AMBULATORY_CARE_PROVIDER_SITE_OTHER): Payer: Medicare PPO

## 2023-05-10 DIAGNOSIS — Z23 Encounter for immunization: Secondary | ICD-10-CM | POA: Diagnosis not present

## 2023-05-16 ENCOUNTER — Encounter (HOSPITAL_BASED_OUTPATIENT_CLINIC_OR_DEPARTMENT_OTHER): Payer: Self-pay | Admitting: Emergency Medicine

## 2023-05-16 ENCOUNTER — Emergency Department (HOSPITAL_BASED_OUTPATIENT_CLINIC_OR_DEPARTMENT_OTHER): Payer: Medicare PPO

## 2023-05-16 ENCOUNTER — Other Ambulatory Visit: Payer: Self-pay

## 2023-05-16 ENCOUNTER — Emergency Department (HOSPITAL_BASED_OUTPATIENT_CLINIC_OR_DEPARTMENT_OTHER)
Admission: EM | Admit: 2023-05-16 | Discharge: 2023-05-16 | Disposition: A | Discharge status: Home / Self Care | Payer: Medicare PPO | Attending: Emergency Medicine | Admitting: Emergency Medicine

## 2023-05-16 DIAGNOSIS — W01198A Fall on same level from slipping, tripping and stumbling with subsequent striking against other object, initial encounter: Secondary | ICD-10-CM | POA: Insufficient documentation

## 2023-05-16 DIAGNOSIS — Z7984 Long term (current) use of oral hypoglycemic drugs: Secondary | ICD-10-CM | POA: Diagnosis not present

## 2023-05-16 DIAGNOSIS — I6782 Cerebral ischemia: Secondary | ICD-10-CM | POA: Diagnosis not present

## 2023-05-16 DIAGNOSIS — E119 Type 2 diabetes mellitus without complications: Secondary | ICD-10-CM | POA: Diagnosis not present

## 2023-05-16 DIAGNOSIS — S0081XA Abrasion of other part of head, initial encounter: Secondary | ICD-10-CM | POA: Insufficient documentation

## 2023-05-16 DIAGNOSIS — S0083XA Contusion of other part of head, initial encounter: Secondary | ICD-10-CM | POA: Diagnosis not present

## 2023-05-16 DIAGNOSIS — W1830XA Fall on same level, unspecified, initial encounter: Secondary | ICD-10-CM | POA: Diagnosis not present

## 2023-05-16 DIAGNOSIS — Z7982 Long term (current) use of aspirin: Secondary | ICD-10-CM | POA: Diagnosis not present

## 2023-05-16 DIAGNOSIS — Z23 Encounter for immunization: Secondary | ICD-10-CM | POA: Diagnosis not present

## 2023-05-16 DIAGNOSIS — I10 Essential (primary) hypertension: Secondary | ICD-10-CM | POA: Diagnosis not present

## 2023-05-16 DIAGNOSIS — Z79899 Other long term (current) drug therapy: Secondary | ICD-10-CM | POA: Insufficient documentation

## 2023-05-16 DIAGNOSIS — S0990XA Unspecified injury of head, initial encounter: Secondary | ICD-10-CM

## 2023-05-16 DIAGNOSIS — Y9389 Activity, other specified: Secondary | ICD-10-CM | POA: Insufficient documentation

## 2023-05-16 DIAGNOSIS — W19XXXA Unspecified fall, initial encounter: Secondary | ICD-10-CM

## 2023-05-16 DIAGNOSIS — I6523 Occlusion and stenosis of bilateral carotid arteries: Secondary | ICD-10-CM | POA: Diagnosis not present

## 2023-05-16 NOTE — ED Notes (Signed)
Dc instructions reviewed with patient. Patient voiced understanding. Dc with belongings.  °

## 2023-05-16 NOTE — ED Triage Notes (Signed)
Pt had standing level trip and fall on Tues.  Struck his left cheekbone and forehead. No thinners.  No LOC.  Pt states he has been sore in extremities but doesn't feel anything is painful per say.  Pt was having pain to pressure of his head and cheekbone and went to UC who recommended head CT

## 2023-05-16 NOTE — ED Provider Notes (Signed)
[place] EMERGENCY DEPARTMENT AT [facility] Provider Note   CSN: [id number] Arrival date & time: 05/16/23  1526     History  Chief Complaint  Patient presents with   Justin Herrera is a 72 y.o. male.  72 year old man with a history of diabetes, hypertension, and hyperlipidemia who presents to the emergency department with head trauma.  Patient reports that on Tuesday he was working on his vehicle when he stood up and lost his footing and then fell.  No dizziness or other preceding symptoms.  Says that he struck the left side of his face.  Went into urgent care today to have his tetanus updated and they referred him to the emergency department because of his age for a head CT.  Denies any neck pain or numbness or weakness of his arms or legs.  Is on a baby aspirin but no other blood thinners.  No LOC or significant headache since.       Home Medications Prior to Admission medications   Medication Sig Start Date End Date Taking? Authorizing Provider  acetaminophen (TYLENOL) 500 MG tablet Take 1,000 mg by mouth as needed for moderate pain or headache.    [provider]  amLODipine-olmesartan (AZOR) 10-40 MG tablet TAKE 1 TABLET BY MOUTH DAILY 04/04/23   Nelwyn Salisbury, MD  aspirin 81 MG tablet Take 81 mg by mouth daily.    [provider]  atorvastatin (LIPITOR) 20 MG tablet Take 1 tablet (20 mg total) by mouth daily. 05/14/22 05/09/23  Rollene Rotunda, MD  diclofenac (VOLTAREN) 75 MG EC tablet Take 75 mg by mouth 2 (two) times daily. 06/03/14   [provider]  esomeprazole (NEXIUM) 40 MG capsule TAKE 1 CAPSULE(40 MG) BY MOUTH DAILY Patient taking differently: Take 40 mg by mouth daily before breakfast. 07/09/22   Nelwyn Salisbury, MD  glipiZIDE (GLUCOTROL) 10 MG tablet TAKE 1 TABLET(10 MG) BY MOUTH TWICE DAILY BEFORE A MEAL 04/04/23   Nelwyn Salisbury, MD  glucose blood (ONE TOUCH ULTRA TEST) test strip Once a day 07/20/15   Nelwyn Salisbury,  MD  hydrochlorothiazide (HYDRODIURIL) 25 MG tablet TAKE 1 TABLET(25 MG) BY MOUTH DAILY 04/17/23   Nelwyn Salisbury, MD  Lancets [facility] ULTRASOFT) lancets Once a day 07/20/15   Nelwyn Salisbury, MD  metFORMIN (GLUCOPHAGE) 500 MG tablet TAKE 1 TABLET(500 MG) BY MOUTH TWICE DAILY WITH A MEAL Patient taking differently: Take 500 mg by mouth in the morning and at bedtime. 07/05/22   Nelwyn Salisbury, MD  metoprolol succinate (TOPROL-XL) 100 MG 24 hr tablet TAKE 1 TABLET(100 MG) BY MOUTH DAILY WITH OR IMMEDIATELY FOLLOWING A MEAL 08/23/22   Nelwyn Salisbury, MD  sertraline (ZOLOFT) 50 MG tablet TAKE 1 TABLET(50 MG) BY MOUTH DAILY 03/05/23   Nelwyn Salisbury, MD  Soft Lens Products (SENSITIVE EYES SALINE) SOLN Place 2-3 drops into both eyes daily as needed (to clear eyes).    [provider]  temazepam (RESTORIL) 30 MG capsule TAKE 1 CAPSULE(30 MG) BY MOUTH AT BEDTIME 03/13/23   Nelwyn Salisbury, MD      Allergies    Patient has no known allergies.    Review of Systems   Review of Systems  Physical Exam Updated Vital Signs BP (!) 160/88   Pulse 61   Temp 98.4 F (36.9 C) (Oral)   Resp 18   SpO2 100%  Physical Exam Constitutional:      General:  He is not in acute distress.    Appearance: Normal appearance. He is not ill-appearing.  HENT:     Head: Normocephalic.     Comments: Small abrasion to left temple    Right Ear: External ear normal.     Left Ear: External ear normal.     Mouth/Throat:     Mouth: Mucous membranes are moist.     Pharynx: Oropharynx is clear.  Eyes:     Extraocular Movements: Extraocular movements intact.     Conjunctiva/sclera: Conjunctivae normal.     Pupils: Pupils are equal, round, and reactive to light.     Comments: Pupils 5 mm bilaterally  Neck:     Comments: No C-spine midline tenderness to palpation Cardiovascular:     Rate and Rhythm: Normal rate and regular rhythm.     Pulses: Normal pulses.     Heart sounds: Normal heart sounds.  Pulmonary:      Effort: Pulmonary effort is normal. No respiratory distress.     Breath sounds: Normal breath sounds.  Abdominal:     General: Abdomen is flat.  Musculoskeletal:        General: No deformity. Normal range of motion.     Cervical back: No rigidity or tenderness.     Comments: No tenderness to palpation of midline thoracic or lumbar spine.  No step-offs palpated.  No tenderness to palpation of chest wall.  No bruising noted.  No tenderness to palpation of bilateral clavicles.  No tenderness to palpation, bruising, or deformities noted of bilateral shoulders, elbows, wrists, hips, knees, or ankles.  Neurological:     General: No focal deficit present.     Mental Status: He is alert and oriented to person, place, and time. Mental status is at baseline.     Cranial Nerves: No cranial nerve deficit.     Sensory: No sensory deficit.     Motor: No weakness.     ED Results / Procedures / Treatments   Labs (all labs ordered are listed, but only abnormal results are displayed) Labs Reviewed - No data to display  EKG None  Radiology CT Head Wo Contrast  Result Date: 05/16/2023 CLINICAL DATA:  Trauma EXAM: CT HEAD WITHOUT CONTRAST TECHNIQUE: Contiguous axial images were obtained from the base of the skull through the vertex without intravenous contrast. RADIATION DOSE REDUCTION: This exam was performed according to the departmental dose-optimization program which includes automated exposure control, adjustment of the mA and/or kV according to patient size and/or use of iterative reconstruction technique. COMPARISON:  None Available. FINDINGS: Brain: No evidence of acute infarction, hemorrhage, hydrocephalus, extra-axial collection or mass lesion/mass effect. There is mild periventricular white matter hypodensity, likely chronic small vessel ischemic change. Vascular: Atherosclerotic calcifications are present within the cavernous internal carotid arteries. Skull: Normal. Negative for fracture or  focal lesion. Sinuses/Orbits: No acute finding. Other: None. IMPRESSION: 1. No acute intracranial process. 2. Mild chronic small vessel ischemic change. Electronically Signed   By: Darliss Cheney M.D.   On: 05/16/2023 17:29    Procedures Procedures    Medications Ordered in ED Medications - No data to display  ED Course/ Medical Decision Making/ A&P                                 Medical Decision Making Amount and/or Complexity of Data Reviewed Radiology: ordered.   CICERO WICKES is a 72 y.o. male with comorbidities that  complicate the patient evaluation including diabetes, hypertension, and hyperlipidemia who presents to the emergency department with head trauma.    Initial Ddx:  TBI, concussion, C-spine injury  MDM/Course:  72 year old male who presents to the emergency department after head trauma due to mechanical fall.  Does have a small abrasion to his head.  Already have his tetanus updated today.  No neck pain and given the fact that this happened several days ago do not feel that neck imaging is warranted especially since he did not have any midline tenderness to palpation or focal neurologic deficits.  CT of his head did not reveal any acute abnormalities.  Upon re-evaluation patient remained stable.  No signs of concussion at this time.  Will him follow-up with his primary doctor.  This patient presents to the ED for concern of complaints listed in HPI, this involves an extensive number of treatment options, and is a complaint that carries with it a high risk of complications and morbidity. Disposition including potential need for admission considered.   Dispo: DC Home. Return precautions discussed including, but not limited to, those listed in the AVS. Allowed pt time to ask questions which were answered fully prior to dc.  Additional history obtained from spouse Records reviewed Outpatient Clinic Notes I independently reviewed the following imaging with scope of  interpretation limited to determining acute life threatening conditions related to emergency care: CT Head and agree with the radiologist interpretation with the following exceptions: none I have reviewed the patients home medications and made adjustments as needed Social Determinants of health:  Elderly  Final Clinical Impression(s) / ED Diagnoses Final diagnoses:  Fall, initial encounter  Traumatic injury of head, initial encounter    Rx / DC Orders ED Discharge Orders     None         Rondel Baton, MD 05/17/23 1031

## 2023-05-16 NOTE — ED Notes (Signed)
Transport to CT

## 2023-05-17 ENCOUNTER — Telehealth: Payer: Self-pay

## 2023-05-17 ENCOUNTER — Other Ambulatory Visit: Payer: Self-pay | Admitting: Neurological Surgery

## 2023-05-17 ENCOUNTER — Other Ambulatory Visit: Payer: Self-pay | Admitting: Family Medicine

## 2023-05-17 NOTE — Telephone Encounter (Signed)
Spoke with patient who is agreeable to see Bernadene Person, NP on 9/27 at 1:55 pm. I will fax to requesting provider's office.

## 2023-05-17 NOTE — Telephone Encounter (Signed)
Name: TWAN SWIGGETT  DOB: Dec 06, 1950  MRN: [id number]  Primary Cardiologist: Rollene Rotunda, MD  Chart reviewed as part of pre-operative protocol coverage. Because of TOMAR CLIPPARD past medical history and time since last visit, he will require a follow-up in-office visit in order to better assess preoperative cardiovascular risk.  Pre-op covering staff: - Please schedule appointment and call patient to inform them. If patient already had an upcoming appointment within acceptable timeframe, please add "pre-op clearance" to the appointment notes so provider is aware. - Please contact requesting surgeon's office via preferred method (i.e, phone, fax) to inform them of need for appointment prior to surgery.   Carlos Levering, NP  05/17/2023, 4:54 PM

## 2023-05-17 NOTE — Telephone Encounter (Signed)
Pre-operative Risk Assessment    Patient Name: Justin Herrera  DOB: 03-May-1951 MRN: [id number]      Request for Surgical Clearance    Procedure:   L2-3 LUMBAR FUSION  Date of Surgery:  Clearance 06/03/23                                 Surgeon:  DR. Autumn Patty  Surgeon's Group or Practice Name:  [facility]  Phone number:  [phone] Fax number:  [phone]   Type of Clearance Requested:   - Medical  - Pharmacy:  Hold Aspirin NEEDS INSTRUCTIONS WHEN TO HOLD   Type of Anesthesia:  General    Additional requests/questions:    SignedMichaelle Copas   05/17/2023, 2:49 PM

## 2023-05-20 MED ORDER — AMLODIPINE-OLMESARTAN 10-40 MG PO TABS: 1.0000 | ORAL_TABLET | Freq: Every day | ORAL | 0 refills | Status: DC

## 2023-05-24 ENCOUNTER — Encounter: Payer: Self-pay | Admitting: Nurse Practitioner

## 2023-05-24 ENCOUNTER — Ambulatory Visit: Payer: Medicare PPO | Attending: Nurse Practitioner | Admitting: Nurse Practitioner

## 2023-05-24 VITALS — BP 138/72 | HR 66 | Ht 71.0 in | Wt 228.2 lb

## 2023-05-24 DIAGNOSIS — G4733 Obstructive sleep apnea (adult) (pediatric): Secondary | ICD-10-CM

## 2023-05-24 DIAGNOSIS — R9439 Abnormal result of other cardiovascular function study: Secondary | ICD-10-CM | POA: Diagnosis not present

## 2023-05-24 DIAGNOSIS — I491 Atrial premature depolarization: Secondary | ICD-10-CM

## 2023-05-24 DIAGNOSIS — R011 Cardiac murmur, unspecified: Secondary | ICD-10-CM

## 2023-05-24 DIAGNOSIS — E782 Mixed hyperlipidemia: Secondary | ICD-10-CM | POA: Diagnosis not present

## 2023-05-24 DIAGNOSIS — E119 Type 2 diabetes mellitus without complications: Secondary | ICD-10-CM | POA: Diagnosis not present

## 2023-05-24 DIAGNOSIS — I10 Essential (primary) hypertension: Secondary | ICD-10-CM

## 2023-05-24 DIAGNOSIS — Z01810 Encounter for preprocedural cardiovascular examination: Secondary | ICD-10-CM

## 2023-05-24 DIAGNOSIS — I493 Ventricular premature depolarization: Secondary | ICD-10-CM | POA: Diagnosis not present

## 2023-05-24 MED ORDER — ATORVASTATIN CALCIUM 20 MG PO TABS: 20.0000 mg | ORAL_TABLET | Freq: Every day | ORAL | 3 refills | Status: DC

## 2023-05-24 NOTE — Patient Instructions (Signed)
Medication Instructions:  Your physician recommends that you continue on your current medications as directed. Please refer to the Current Medication list given to you today.  *If you need a refill on your cardiac medications before your next appointment, please call your pharmacy*   Lab Work: NONE ordered at this time of appointment     Testing/Procedures: NONE ordered at this time of appointment     Follow-Up: At [facility], you and your health needs are our priority.  As part of our continuing mission to provide you with exceptional heart care, we have created designated Provider Care Teams.  These Care Teams include your primary Cardiologist (physician) and Advanced Practice Providers (APPs -  Physician Assistants and Nurse Practitioners) who all work together to provide you with the care you need, when you need it.  We recommend signing up for the patient portal called "MyChart".  Sign up information is provided on this After Visit Summary.  MyChart is used to connect with patients for Virtual Visits (Telemedicine).  Patients are able to view lab/test results, encounter notes, upcoming appointments, etc.  Non-urgent messages can be sent to your provider as well.   To learn more about what you can do with MyChart, go to [email or web address].    Your next appointment:   1 year(s)  Provider:   Rollene Rotunda, MD

## 2023-05-24 NOTE — Progress Notes (Addendum)
Office Visit    Patient Name: Justin Herrera Date of Encounter: 05/24/2023  Primary Care Provider:  Nelwyn Salisbury, MD Primary Cardiologist:  Rollene Rotunda, MD  Chief Complaint    72 year old male with a history of abnormal stress test, cardiac murmur, PVCs, hypertension, dyslipidemia, type 2 diabetes, OSA, and GERD who presents for follow-up related to hypertension.   Past Medical History    Past Medical History:  Diagnosis Date   Anxiety    on meds   Arthritis    generalized   Depression    on meds   Diabetes mellitus    Type II-on meds   GERD (gastroesophageal reflux disease)    Headache    Heart murmur    slight murmur noted per pt   Hypertension    on meds   Insomnia    Sleep apnea, obstructive    wears a CPAP   Symptomatic cholelithiasis    Past Surgical History:  Procedure Laterality Date   CHOLECYSTECTOMY N/A 03/28/2018   Procedure: LAPAROSCOPIC CHOLECYSTECTOMY WITH INTRAOPERATIVE CHOLANGIOGRAM;  Surgeon: Gaynelle Adu, MD;  Location: [facility] OR;  Service: General;  Laterality: N/A;   COLONOSCOPY  09/04/2021   per Dr. Russella Dar, adenomatous polyps, repeat in 5 yrs  (movi(prep good)   CYSTECTOMY Right 2014   right wrist   KNEE SURGERY  1968   RETINAL TEAR REPAIR CRYOTHERAPY     TRANSFORAMINAL LUMBAR INTERBODY FUSION W/ MIS 1 LEVEL Right 07/16/2022   Procedure: Right Lumbar three-four MinimallyInvasive Surgery Decompression/Transforaminal Lumbar Interbody Fusion;  Surgeon: Jadene Pierini, MD;  Location: [facility] OR;  Service: Neurosurgery;  Laterality: Right;  RM 21/3C    Allergies  No Known Allergies   Labs/Other Studies Reviewed    The following studies were reviewed today:  Cardiac Studies & Procedures     STRESS TESTS  MYOCARDIAL PERFUSION IMAGING 07/15/2015  Narrative  The left ventricular ejection fraction is mildly decreased (45-54%).  Nuclear stress EF: 48%.  Defect 1: There is a medium defect of moderate severity present in the basal  inferior, basal inferolateral and mid inferolateral location.  This is an intermediate risk study.  Findings consistent with prior myocardial infarction with peri-infarct ischemia.   ECHOCARDIOGRAM  ECHOCARDIOGRAM COMPLETE 08/17/2015  Narrative *[facility]* [address] [place], [place] [address] [phone]  ------------------------------------------------------------------- Transthoracic Echocardiography  Patient:    Tacorey, Cotrone MR #:       [id number] Study Date: 08/17/2015 Gender:     M Age:        26 Height:     180.3 cm Weight:     106.6 kg BSA:        2.34 m^2 Pt. Status: Room:  ATTENDING    Olga Millers ORDERING     Rollene Rotunda, MD REFERRING    Rollene Rotunda, MD SONOGRAPHER  Aida Raider, RDCS PERFORMING   [facility], Outpatient  cc:  ------------------------------------------------------------------- LV EF: 55% -   60%  ------------------------------------------------------------------- Indications:      R94.39 Abnormal Stress Test.  ------------------------------------------------------------------- History:   PMH:  Acquired from the patient and from the patient&'s chart.  PMH:  Obstructive Sleep Apnea-CPAP.  Risk factors: Hypertension. Diabetes mellitus.  ------------------------------------------------------------------- Study Conclusions  - Left ventricle: The cavity size was normal. There was mild focal basal hypertrophy of the septum. Systolic function was normal. The estimated ejection fraction was in the range of 55% to 60%. Wall motion was normal; there were no regional wall motion abnormalities. Doppler parameters are  consistent with abnormal left ventricular relaxation (grade 1 diastolic dysfunction). - Mitral valve: There was mild systolic anterior motion of the chordal structures.  Impressions:  - Normal LV systolic function; mild basal septal hypertrophy; mild chordal SAM; grade 1 diastolic  dysfunction.  Transthoracic echocardiography.  M-mode, complete 2D, spectral Doppler, and color Doppler.  Birthdate:  Patient birthdate: 11-07-1950.  Age:  Patient is 72 yr old.  Sex:  Gender: male. BMI: 32.8 kg/m^2.  Blood pressure:     142/94  Patient status: Outpatient.  Study date:  Study date: 08/17/2015. Study time: 02:19 PM.  Location:  [facility]  -------------------------------------------------------------------  ------------------------------------------------------------------- Left ventricle:  The cavity size was normal. There was mild focal basal hypertrophy of the septum. Systolic function was normal. The estimated ejection fraction was in the range of 55% to 60%. Wall motion was normal; there were no regional wall motion abnormalities. Doppler parameters are consistent with abnormal left ventricular relaxation (grade 1 diastolic dysfunction).  ------------------------------------------------------------------- Aortic valve:   Trileaflet; mildly calcified leaflets. Mobility was not restricted.  Doppler:  Transvalvular velocity was within the normal range. There was no stenosis. There was no regurgitation.  ------------------------------------------------------------------- Aorta:  Aortic root: The aortic root was normal in size.  ------------------------------------------------------------------- Mitral valve:   Mildly thickened leaflets . Mobility was not restricted. There was mild systolic anterior motion of the chordal structures.  Doppler:  Transvalvular velocity was within the normal range. There was no evidence for stenosis. There was no regurgitation.  ------------------------------------------------------------------- Left atrium:  The atrium was normal in size.  ------------------------------------------------------------------- Right ventricle:  The cavity size was normal. Systolic function  was normal.  ------------------------------------------------------------------- Pulmonic valve:    Doppler:  Transvalvular velocity was within the normal range. There was no evidence for stenosis. There was trivial regurgitation.  ------------------------------------------------------------------- Tricuspid valve:   Structurally normal valve.    Doppler: Transvalvular velocity was within the normal range. There was no regurgitation.  ------------------------------------------------------------------- Right atrium:  The atrium was normal in size.  ------------------------------------------------------------------- Pericardium:  There was no pericardial effusion.  ------------------------------------------------------------------- Systemic veins: Inferior vena cava: The vessel was normal in size.  ------------------------------------------------------------------- Measurements  Left ventricle                         Value        Reference LV ID, ED, PLAX chordal                43.3  mm     43 - 52 LV ID, ES, PLAX chordal                30    mm     23 - 38 LV fx shortening, PLAX chordal         31    %      >=29 LV PW thickness, ED                    9.24  mm     --------- IVS/LV PW ratio, ED            (H)     1.36         <=1.3 Stroke volume, 2D                      83    ml     --------- Stroke volume/bsa, 2D  35    ml/m^2 --------- LV e&', lateral                         7.02  cm/s   --------- LV E/e&', lateral                       8.69         --------- LV e&', medial                          5.7   cm/s   --------- LV E/e&', medial                        10.7         --------- LV e&', average                         6.36  cm/s   --------- LV E/e&', average                       9.59         ---------  Ventricular septum                     Value        Reference IVS thickness, ED                      12.6  mm     ---------  LVOT                                    Value        Reference LVOT ID, S                             23    mm     --------- LVOT area                              4.15  cm^2   --------- LVOT peak velocity, S                  131   cm/s   --------- LVOT mean velocity, S                  99    cm/s   --------- LVOT VTI, S                            20.1  cm     --------- LVOT peak gradient, S                  7     mm Hg  ---------  Aorta                                  Value        Reference Aortic root ID, ED                     35    mm     ---------  Ascending aorta ID, A-P, S             35    mm     ---------  Left atrium                            Value        Reference LA ID, A-P, ES                         49    mm     --------- LA ID/bsa, A-P                         2.09  cm/m^2 <=2.2 LA volume, S                           41    ml     --------- LA volume/bsa, S                       17.5  ml/m^2 --------- LA volume, ES, 1-p A4C                 27    ml     --------- LA volume/bsa, ES, 1-p A4C             11.5  ml/m^2 --------- LA volume, ES, 1-p A2C                 54    ml     --------- LA volume/bsa, ES, 1-p A2C             23    ml/m^2 ---------  Mitral valve                           Value        Reference Mitral E-wave peak velocity            61    cm/s   --------- Mitral A-wave peak velocity            87.8  cm/s   --------- Mitral deceleration time               225   ms     150 - 230 Mitral E/A ratio, peak                 0.69         ---------  Right ventricle                        Value        Reference RV s&', lateral, S                      16.9  cm/s   ---------  Legend: (L)  and  (H)  mark values outside specified reference range.  ------------------------------------------------------------------- Prepared and Electronically Authenticated by  Olga Millers 2016-12-21T16:11:27            Recent Labs: 11/02/2022: ALT 15; BUN 33; Creatinine, Ser 1.61; Hemoglobin 14.8; Platelets  243.0; Potassium 4.6; Sodium 138; TSH 1.70  Recent Lipid Panel    Component Value Date/Time   CHOL 101 11/02/2022 1133   CHOL 102 09/25/2022 1155   TRIG 164.0 (H) 11/02/2022  1133   HDL 29.50 (L) 11/02/2022 1133   HDL 26 (L) 09/25/2022 1155   CHOLHDL 3 11/02/2022 1133   VLDL 32.8 11/02/2022 1133   LDLCALC 39 11/02/2022 1133   LDLCALC 45 09/25/2022 1155   LDLDIRECT 111.0 08/23/2021 1612    History of Present Illness    72 year old male with the above past medical history including abnormal stress test, cardiac murmur, PVCs, hypertension, dyslipidemia, type 2 diabetes, OSA, and GERD.   He has a history of abnormal stress test in 2016 which suggested EF of 48%.  There was medium defect of moderate severity in the basal inferior, basal inferolateral and inferolateral location with question of peri-infarct ischemia.  In spite of this he was able to walk for 8 minutes and ultimately got his heart rate to above 85%.  He did not have any symptoms.  Echocardiogram in 2016 showed EF 55 to 60%, mild focal basal hypertrophy of the septum, no RWMA, G1 DD, no significant valvular abnormalities.  Medical management was advised.  He was last seen in the office on 05/14/2022 and was stable from a cardiac standpoint.  He denies symptoms concerning for angina.  He was seen virtually on 07/11/2022 for preoperative cardiac evaluation and was doing well.  He presents today for follow-up and for preoperative cardiac evaluation for upcoming L2-3 lumbar fusion on 06/03/2023 with Dr. Ervin Knack of [facility] neurosurgery [facility] spine. Since his last visit he has done well from a cardiac standpoint.  He denies any symptoms concerning for angina. He denies any palpitations, dyspnea, edema, PND, orthopnea, weight gain. Overall, he reports feeling well.  Home Medications    Current Outpatient Medications  Medication Sig Dispense Refill   acetaminophen (TYLENOL) 500 MG tablet Take 1,000 mg by mouth as needed for  moderate pain or headache.     amLODipine-olmesartan (AZOR) 10-40 MG tablet Take 1 tablet by mouth daily. 90 tablet 0   aspirin 81 MG tablet Take 81 mg by mouth daily.     diclofenac (VOLTAREN) 75 MG EC tablet Take 75 mg by mouth 2 (two) times daily.     esomeprazole (NEXIUM) 40 MG capsule TAKE 1 CAPSULE(40 MG) BY MOUTH DAILY (Patient taking differently: Take 40 mg by mouth daily before breakfast.) 90 capsule 3   glipiZIDE (GLUCOTROL) 10 MG tablet TAKE 1 TABLET(10 MG) BY MOUTH TWICE DAILY BEFORE A MEAL 180 tablet 0   glucose blood (ONE TOUCH ULTRA TEST) test strip Once a day 100 each 2   hydrochlorothiazide (HYDRODIURIL) 25 MG tablet TAKE 1 TABLET(25 MG) BY MOUTH DAILY 90 tablet 0   Lancets (ONETOUCH ULTRASOFT) lancets Once a day 100 each 2   metFORMIN (GLUCOPHAGE) 500 MG tablet TAKE 1 TABLET(500 MG) BY MOUTH TWICE DAILY WITH A MEAL (Patient taking differently: Take 500 mg by mouth in the morning and at bedtime.) 180 tablet 3   metoprolol succinate (TOPROL-XL) 100 MG 24 hr tablet TAKE 1 TABLET(100 MG) BY MOUTH DAILY WITH OR IMMEDIATELY FOLLOWING A MEAL 90 tablet 3   sertraline (ZOLOFT) 50 MG tablet TAKE 1 TABLET(50 MG) BY MOUTH DAILY 30 tablet 2   Soft Lens Products (SENSITIVE EYES SALINE) SOLN Place 2-3 drops into both eyes daily as needed (to clear eyes).     temazepam (RESTORIL) 30 MG capsule TAKE 1 CAPSULE(30 MG) BY MOUTH AT BEDTIME 90 capsule 1   atorvastatin (LIPITOR) 20 MG tablet Take 1 tablet (20 mg total) by mouth daily. 90 tablet 3   No current facility-administered medications for  this visit.     Review of Systems    He denies chest pain, palpitations, dyspnea, pnd, orthopnea, n, v, dizziness, syncope, edema, weight gain, or early satiety. All other systems reviewed and are otherwise negative except as noted above.   Physical Exam    VS:  BP 138/72   Pulse 66   Ht 5\' 11"  (1.803 m)   Wt 228 lb 3.2 oz (103.5 kg)   SpO2 98%   BMI 31.83 kg/m  GEN: Well nourished, well  developed, in no acute distress. HEENT: normal. Neck: Supple, no JVD, carotid bruits, or masses. Cardiac: RRR,  2/6 murmur, no rubs, or gallops. No clubbing, cyanosis, edema.  Radials/DP/PT 2+ and equal bilaterally.  Respiratory:  Respirations regular and unlabored, clear to auscultation bilaterally. GI: Soft, nontender, nondistended, BS + x 4. MS: no deformity or atrophy. Skin: warm and dry, no rash. Neuro:  Strength and sensation are intact. Psych: Normal affect.  Accessory Clinical Findings    ECG personally reviewed by me today - EKG Interpretation Date/Time:  Friday May 24 2023 13:44:39 EDT Ventricular Rate:  66 PR Interval:  196 QRS Duration:  112 QT Interval:  392 QTC Calculation: 410 R Axis:   -41  Text Interpretation: Sinus rhythm with Premature atrial complexes Left axis deviation Moderate voltage criteria for LVH, may be normal variant Confirmed by Bernadene Person ([id number]) on 05/24/2023 1:57:09 PM  - no acute changes.   Lab Results  Component Value Date   WBC 6.9 11/02/2022   HGB 14.8 11/02/2022   HCT 42.1 11/02/2022   MCV 86.2 11/02/2022   PLT 243.0 11/02/2022   Lab Results  Component Value Date   CREATININE 1.61 (H) 11/02/2022   BUN 33 (H) 11/02/2022   NA 138 11/02/2022   K 4.6 11/02/2022   CL 99 11/02/2022   CO2 32 11/02/2022   Lab Results  Component Value Date   ALT 15 11/02/2022   AST 15 11/02/2022   ALKPHOS 47 11/02/2022   BILITOT 0.5 11/02/2022   Lab Results  Component Value Date   CHOL 101 11/02/2022   HDL 29.50 (L) 11/02/2022   LDLCALC 39 11/02/2022   LDLDIRECT 111.0 08/23/2021   TRIG 164.0 (H) 11/02/2022   CHOLHDL 3 11/02/2022    Lab Results  Component Value Date   HGBA1C 6.7 (H) 11/02/2022    Assessment & Plan    1. Abnormal stress test: Stress test in 2016 which suggested EF of 48%.  There was medium defect of moderate severity in the basal inferior, basal inferolateral and inferolateral location with question of peri-infarct  ischemia. Managed medically. Stable with no anginal symptoms. No indication for ischemic evaluation.  Continue aspirin, amlodipine-olmesartan, hydrochlorothiazide, metoprolol, Lipitor.  2. Cardiac murmur: Echocardiogram in 2016 showed EF 55 to 60%, mild focal basal hypertrophy of the septum, no RWMA, G1 DD, no significant valvular abnormalities.  He does have a murmur on exam, asymptomatic.  Consider repeat echocardiogram as clinically indicated.   3. PVCs/PACS: Denies any recent palpitations.  Continue metoprolol.  4. Hypertension: BP well controlled. Continue current antihypertensive regimen.   5. Dyslipidemia: LDL was 39 in 10/2022.  Continue Lipitor.  6. Type 2 diabetes: A1c was 6.7 in 10/2022.  Monitor managed per PCP.  Continue Lipitor.  7. OSA: Adherent to CPAP. No concerns.   8. Preoperative cardiac exam: According to the Revised Cardiac Risk Index (RCRI), his Perioperative Risk of Major Cardiac Event is (%): 0.4. His Functional Capacity in METs is: 6.27 according to  the Duke Activity Status Index (DASI).Therefore, based on ACC/AHA guidelines, patient would be at acceptable risk for the planned procedure without further cardiovascular testing. Per office protocol, he may hold Aspirin for 5-7 days prior to procedure. Please resume Aspirin as soon as possible postprocedure, at the discretion of the surgeon. I will route this recommendation to the requesting party via Epic fax function.  9. Disposition: Follow-up in 1 year.       Joylene Grapes, NP 05/24/2023, 2:17 PM

## 2023-05-28 NOTE — Progress Notes (Signed)
Surgical Instructions   Your procedure is scheduled on Monday June 03, 2023. Report to [facility] Main Entrance "A" at 5:30 A.M., then check in with the Admitting office. Any questions or running late day of surgery: call [phone]  Questions prior to your surgery date: call [phone], Monday-Friday, 8am-4pm. If you experience any cold or flu symptoms such as cough, fever, chills, shortness of breath, etc. between now and your scheduled surgery, please notify us at the above number.     Remember:  Do not eat after midnight the night before your surgery  You may drink clear liquids until 4:30 the morning of your surgery.   Clear liquids allowed are: Water, Non-Citrus Juices (without pulp), Carbonated Beverages, Clear Tea, Black Coffee Only (NO MILK, CREAM OR POWDERED CREAMER of any kind), and Gatorade.    Take these medicines the morning of surgery with A SIP OF WATER  atorvastatin (LIPITOR)  esomeprazole (NEXIUM)  metoprolol succinate (TOPROL-XL)  sertraline (ZOLOFT)    May take these medicines IF NEEDED: acetaminophen (TYLENOL)  Soft Lens Products (SENSITIVE EYES SALINE) SOLN    Per your Cardiologist, STOP taking your aspirin 5 days prior to surgery, with the last dose being 05/28/2023.    One week prior to surgery, STOP taking any Aleve, Naproxen, Ibuprofen, Motrin, Advil, Goody's, BC's, all herbal medications, fish oil, and non-prescription vitamins.  This includes your diclofenac (VOLTAREN).    WHAT DO I DO ABOUT MY DIABETES MEDICATION?   Do not take oral diabetes medicines (pills) the morning of surgery.    DO NOT TAKE YOUR glipiZIDE (GLUCOTROL) THE NIGHT BEFORE OR THE DAY OF SURGERY.         DO NOT TAKE YOUR metFORMIN (GLUCOPHAGE) THE DAY OF SURGERY.    The day of surgery, do not take other diabetes injectables, including Byetta (exenatide), Bydureon (exenatide ER), Victoza (liraglutide), or Trulicity (dulaglutide).  If your CBG is greater than 220 mg/dL,  you may take  of your sliding scale (correction) dose of insulin.   HOW TO MANAGE YOUR DIABETES BEFORE AND AFTER SURGERY  Why is it important to control my blood sugar before and after surgery? Improving blood sugar levels before and after surgery helps healing and can limit problems. A way of improving blood sugar control is eating a healthy diet by:  Eating less sugar and carbohydrates  Increasing activity/exercise  Talking with your doctor about reaching your blood sugar goals High blood sugars (greater than 180 mg/dL) can raise your risk of infections and slow your recovery, so you will need to focus on controlling your diabetes during the weeks before surgery. Make sure that the doctor who takes care of your diabetes knows about your planned surgery including the date and location.  How do I manage my blood sugar before surgery? Check your blood sugar at least 4 times a day, starting 2 days before surgery, to make sure that the level is not too high or low.  Check your blood sugar the morning of your surgery when you wake up and every 2 hours until you get to the Short Stay unit.  If your blood sugar is less than 70 mg/dL, you will need to treat for low blood sugar: Do not take insulin. Treat a low blood sugar (less than 70 mg/dL) with  cup of clear juice (cranberry or apple), 4 glucose tablets, OR glucose gel. Recheck blood sugar in 15 minutes after treatment (to make sure it is greater than 70 mg/dL). If your blood sugar  is not greater than 70 mg/dL on recheck, call [phone] for further instructions. Report your blood sugar to the short stay nurse when you get to Short Stay.  If you are admitted to the hospital after surgery: Your blood sugar will be checked by the staff and you will probably be given insulin after surgery (instead of oral diabetes medicines) to make sure you have good blood sugar levels. The goal for blood sugar control after surgery is 80-180 mg/dL.                         Do NOT Smoke (Tobacco/Vaping) for 24 hours prior to your procedure.  If you use a CPAP at night, you may bring your mask/headgear for your overnight stay.   You will be asked to remove any contacts, glasses, piercing's, hearing aid's, dentures/partials prior to surgery. Please bring cases for these items if needed.    Patients discharged the day of surgery will not be allowed to drive home, and someone needs to stay with them for 24 hours.  SURGICAL WAITING ROOM VISITATION Patients may have no more than 2 support people in the waiting area - these visitors may rotate.   Pre-op nurse will coordinate an appropriate time for 1 ADULT support person, who may not rotate, to accompany patient in pre-op.  Children under the age of 55 must have an adult with them who is not the patient and must remain in the main waiting area with an adult.  If the patient needs to stay at the hospital during part of their recovery, the visitor guidelines for inpatient rooms apply.  Please refer to the [facility] website for the visitor guidelines for any additional information.   If you received a COVID test during your pre-op visit  it is requested that you wear a mask when out in public, stay away from anyone that may not be feeling well and notify your surgeon if you develop symptoms. If you have been in contact with anyone that has tested positive in the last 10 days please notify you surgeon.      Pre-operative 5 CHG Bathing Instructions   You can play a key role in reducing the risk of infection after surgery. Your skin needs to be as free of germs as possible. You can reduce the number of germs on your skin by washing with CHG (chlorhexidine gluconate) soap before surgery. CHG is an antiseptic soap that kills germs and continues to kill germs even after washing.   DO NOT use if you have an allergy to chlorhexidine/CHG or antibacterial soaps. If your skin becomes reddened or irritated,  stop using the CHG and notify one of our RNs at [phone].   Please shower with the CHG soap starting 4 days before surgery using the following schedule:     Please keep in mind the following:  DO NOT shave, including legs and underarms, starting the day of your first shower.   You may shave your face at any point before/day of surgery.  Place clean sheets on your bed the day you start using CHG soap. Use a clean washcloth (not used since being washed) for each shower. DO NOT sleep with pets once you start using the CHG.   CHG Shower Instructions:  Wash your face and private area with normal soap. If you choose to wash your hair, wash first with your normal shampoo.  After you use shampoo/soap, rinse your hair and body thoroughly  to remove shampoo/soap residue.  Turn the water OFF and apply about 3 tablespoons (45 ml) of CHG soap to a CLEAN washcloth.  Apply CHG soap ONLY FROM YOUR NECK DOWN TO YOUR TOES (washing for 3-5 minutes)  DO NOT use CHG soap on face, private areas, open wounds, or sores.  Pay special attention to the area where your surgery is being performed.  If you are having back surgery, having someone wash your back for you may be helpful. Wait 2 minutes after CHG soap is applied, then you may rinse off the CHG soap.  Pat dry with a clean towel  Put on clean clothes/pajamas   If you choose to wear lotion, please use ONLY the CHG-compatible lotions on the back of this paper.   Additional instructions for the day of surgery: DO NOT APPLY any lotions, deodorants or cologne  Do not bring valuables to the hospital. [facility] is not responsible for any belongings/valuables. Do not wear jewelry Put on clean/comfortable clothes.  Please brush your teeth.  Ask your nurse before applying any prescription medications to the skin.     CHG Compatible Lotions   Aveeno Moisturizing lotion  Cetaphil Moisturizing Cream  Cetaphil Moisturizing Lotion  Clairol Herbal Essence  Moisturizing Lotion, Dry Skin  Clairol Herbal Essence Moisturizing Lotion, Extra Dry Skin  Clairol Herbal Essence Moisturizing Lotion, Normal Skin  Curel Age Defying Therapeutic Moisturizing Lotion with Alpha Hydroxy  Curel Extreme Care Body Lotion  Curel Soothing Hands Moisturizing Hand Lotion  Curel Therapeutic Moisturizing Cream, Fragrance-Free  Curel Therapeutic Moisturizing Lotion, Fragrance-Free  Curel Therapeutic Moisturizing Lotion, Original Formula  Eucerin Daily Replenishing Lotion  Eucerin Dry Skin Therapy Plus Alpha Hydroxy Crme  Eucerin Dry Skin Therapy Plus Alpha Hydroxy Lotion  Eucerin Original Crme  Eucerin Original Lotion  Eucerin Plus Crme Eucerin Plus Lotion  Eucerin TriLipid Replenishing Lotion  Keri Anti-Bacterial Hand Lotion  Keri Deep Conditioning Original Lotion Dry Skin Formula Softly Scented  Keri Deep Conditioning Original Lotion, Fragrance Free Sensitive Skin Formula  Keri Lotion Fast Absorbing Fragrance Free Sensitive Skin Formula  Keri Lotion Fast Absorbing Softly Scented Dry Skin Formula  Keri Original Lotion  Keri Skin Renewal Lotion Keri Silky Smooth Lotion  Keri Silky Smooth Sensitive Skin Lotion  Nivea Body Creamy Conditioning Oil  Nivea Body Extra Enriched Lotion  Nivea Body Original Lotion  Nivea Body Sheer Moisturizing Lotion Nivea Crme  Nivea Skin Firming Lotion  NutraDerm 30 Skin Lotion  NutraDerm Skin Lotion  NutraDerm Therapeutic Skin Cream  NutraDerm Therapeutic Skin Lotion  ProShield Protective Hand Cream  Provon moisturizing lotion  Please read over the following fact sheets that you were given.

## 2023-05-29 ENCOUNTER — Other Ambulatory Visit: Payer: Self-pay

## 2023-05-29 ENCOUNTER — Encounter (HOSPITAL_COMMUNITY): Payer: Self-pay

## 2023-05-29 ENCOUNTER — Encounter (HOSPITAL_COMMUNITY)
Admission: RE | Admit: 2023-05-29 | Discharge: 2023-05-29 | Disposition: A | Discharge status: Home / Self Care | Payer: Medicare PPO | Source: Ambulatory Visit | Attending: Neurological Surgery | Admitting: Neurological Surgery

## 2023-05-29 VITALS — BP 140/85 | HR 74 | Temp 98.3°F | Resp 18 | Ht 71.0 in | Wt 228.0 lb

## 2023-05-29 DIAGNOSIS — G4733 Obstructive sleep apnea (adult) (pediatric): Secondary | ICD-10-CM | POA: Diagnosis not present

## 2023-05-29 DIAGNOSIS — K219 Gastro-esophageal reflux disease without esophagitis: Secondary | ICD-10-CM | POA: Insufficient documentation

## 2023-05-29 DIAGNOSIS — I10 Essential (primary) hypertension: Secondary | ICD-10-CM | POA: Diagnosis not present

## 2023-05-29 DIAGNOSIS — E1122 Type 2 diabetes mellitus with diabetic chronic kidney disease: Secondary | ICD-10-CM | POA: Diagnosis not present

## 2023-05-29 DIAGNOSIS — Z01812 Encounter for preprocedural laboratory examination: Secondary | ICD-10-CM | POA: Diagnosis not present

## 2023-05-29 DIAGNOSIS — Z01818 Encounter for other preprocedural examination: Secondary | ICD-10-CM

## 2023-05-29 DIAGNOSIS — E785 Hyperlipidemia, unspecified: Secondary | ICD-10-CM | POA: Diagnosis not present

## 2023-05-29 DIAGNOSIS — N183 Chronic kidney disease, stage 3 unspecified: Secondary | ICD-10-CM | POA: Diagnosis not present

## 2023-05-29 DIAGNOSIS — Z794 Long term (current) use of insulin: Secondary | ICD-10-CM | POA: Diagnosis not present

## 2023-05-29 DIAGNOSIS — E119 Type 2 diabetes mellitus without complications: Secondary | ICD-10-CM

## 2023-05-29 HISTORY — DX: Other specified postprocedural states: Z98.890

## 2023-05-29 LAB — BASIC METABOLIC PANEL
Anion gap: 8 (ref 5–15)
BUN: 24 mg/dL — ABNORMAL HIGH (ref 8–23)
CO2: 27 mmol/L (ref 22–32)
Calcium: 9.5 mg/dL (ref 8.9–10.3)
Chloride: 100 mmol/L (ref 98–111)
Creatinine, Ser: 1.65 mg/dL — ABNORMAL HIGH (ref 0.61–1.24)
GFR, Estimated: 44 mL/min — ABNORMAL LOW (ref >60)
Glucose, Bld: 214 mg/dL — ABNORMAL HIGH (ref 70–99)
Potassium: 3.8 mmol/L (ref 3.5–5.1)
Sodium: 135 mmol/L (ref 135–145)

## 2023-05-29 LAB — CBC
HCT: 40 % (ref 39.0–52.0)
Hemoglobin: 13.9 g/dL (ref 13.0–17.0)
MCH: 30 pg (ref 26.0–34.0)
MCHC: 34.8 g/dL (ref 30.0–36.0)
MCV: 86.2 fL (ref 80.0–100.0)
Platelets: 194 10*3/uL (ref 150–400)
RBC: 4.64 MIL/uL (ref 4.22–5.81)
RDW: 12.3 % (ref 11.5–15.5)
WBC: 6.2 10*3/uL (ref 4.0–10.5)
nRBC: 0 % (ref 0.0–0.2)

## 2023-05-29 LAB — HEMOGLOBIN A1C
Hgb A1c MFr Bld: 7.2 % — ABNORMAL HIGH (ref 4.8–5.6)
Mean Plasma Glucose: 159.94 mg/dL

## 2023-05-29 LAB — TYPE AND SCREEN
ABO/RH(D): O POS
Antibody Screen: NEGATIVE

## 2023-05-29 LAB — GLUCOSE, CAPILLARY: Glucose-Capillary: 242 mg/dL — ABNORMAL HIGH (ref 70–99)

## 2023-05-29 LAB — SURGICAL PCR SCREEN
MRSA, PCR: NEGATIVE
Staphylococcus aureus: NEGATIVE

## 2023-05-29 NOTE — Progress Notes (Signed)
PCP - Nelwyn Salisbury, MD  Cardiologist - Rollene Rotunda, MD   PPM/ICD - Denies Device Orders - n/a Rep Notified - n/a  Chest x-ray - 07-18-22 EKG - 05-24-23 Stress Test - 07-15-15 ECHO - 08-17-15 Cardiac Cath -   Sleep Study - yes CPAP -  Yes uses nightly  Fasting Blood Sugar -  130-150 per patient. Blood sugar 242 at PAT appointment Checks Blood Sugar per patient may once a month at home.  MD monitors A1c  Blood Thinner Instructions: denies Aspirin Instructions: Last dose 05-27-23  ERAS Protcol - Clear liquids until 4:30 a.m.  COVID TEST- n/a   Anesthesia review: yes cardiac history  Patient denies shortness of breath, fever, cough and chest pain at PAT appointment   All instructions explained to the patient, with a verbal understanding of the material. Patient agrees to go over the instructions while at home for a better understanding. Patient also instructed to self quarantine after being tested for COVID-19. The opportunity to ask questions was provided.

## 2023-05-30 NOTE — Progress Notes (Signed)
Anesthesia Chart Review:  72 year old male follows with cardiology for history of abnormal stress test 2016, murmur, PVCs, HTN, HLD, OSA on CPAP.  Seen by Bernadene Person, NP on 05/24/2023 for preop evaluation.  Per note, "Abnormal stress test: Stress test in 2016 which suggested EF of 48%.  There was medium defect of moderate severity in the basal inferior, basal inferolateral and inferolateral location with question of peri-infarct ischemia. Managed medically. Stable with no anginal symptoms. No indication for ischemic evaluation.  Continue aspirin, amlodipine-olmesartan, hydrochlorothiazide, metoprolol, Lipitor.  Cardiac murmur: Echocardiogram in 2016 showed EF 55 to 60%, mild focal basal hypertrophy of the septum, no RWMA, G1 DD, no significant valvular abnormalities.  He does have a murmur on exam, asymptomatic.  Consider repeat echocardiogram as clinically indicated.Marland KitchenMarland KitchenPreoperative cardiac exam: According to the Revised Cardiac Risk Index (RCRI), his Perioperative Risk of Major Cardiac Event is (%): 0.4. His Functional Capacity in METs is: 6.27 according to the Duke Activity Status Index (DASI).Therefore, based on ACC/AHA guidelines, patient would be at acceptable risk for the planned procedure without further cardiovascular testing. Per office protocol, he may hold Aspirin for 5-7 days prior to procedure. Please resume Aspirin as soon as possible postprocedure, at the discretion of the surgeon. I will route this recommendation to the requesting party via Epic fax function."  CKD 3 by labs.  Insulin-dependent DM2.  A1c 7.2 on 05/29/2023.  GERD, maintained on esomeprazole.  Preop labs reviewed, creatinine elevated 1.65 consistent with history (baseline appears to be ~1.7), otherwise unremarkable.  EKG 05/24/2023: Sinus rhythm with Premature atrial complexes.  Rate 66. Left axis deviation  TTE 08/17/2015: - Left ventricle: The cavity size was normal. There was mild focal    basal hypertrophy of the  septum. Systolic function was normal.    The estimated ejection fraction was in the range of 55% to 60%.    Wall motion was normal; there were no regional wall motion    abnormalities. Doppler parameters are consistent with abnormal    left ventricular relaxation (grade 1 diastolic dysfunction).  - Mitral valve: There was mild systolic anterior motion of the    chordal structures.   Impressions:   - Normal LV systolic function; mild basal septal hypertrophy; mild    chordal SAM; grade 1 diastolic dysfunction.   Nuclear stress 07/15/2015: The left ventricular ejection fraction is mildly decreased (45-54%). Nuclear stress EF: 48%. Defect 1: There is a medium defect of moderate severity present in the basal inferior, basal inferolateral and mid inferolateral location. This is an intermediate risk study. Findings consistent with prior myocardial infarction with peri-infarct ischemia.    Zannie Cove [facility] Stay Center/Anesthesiology Phone [phone] 05/30/2023 12:24 PM

## 2023-05-30 NOTE — Anesthesia Preprocedure Evaluation (Addendum)
Anesthesia Evaluation  Patient identified by MRN, date of birth, ID band Patient awake    Reviewed: Allergy & Precautions, NPO status , Patient's Chart, lab work & pertinent test results, reviewed documented beta blocker date and time   History of Anesthesia Complications (+) PONV and history of anesthetic complications  Airway Mallampati: II  TM Distance: >3 FB Neck ROM: Full    Dental  (+) Teeth Intact, Dental Advisory Given   Pulmonary sleep apnea and Continuous Positive Airway Pressure Ventilation    breath sounds clear to auscultation       Cardiovascular hypertension, Pt. on medications and Pt. on home beta blockers  Rhythm:Regular Rate:Normal     Neuro/Psych  Headaches PSYCHIATRIC DISORDERS Anxiety Depression     Neuromuscular disease    GI/Hepatic Neg liver ROS,GERD  Medicated,,  Endo/Other  diabetes, Type 2, Oral Hypoglycemic Agents    Renal/GU Renal disease     Musculoskeletal  (+) Arthritis ,    Abdominal   Peds  Hematology negative hematology ROS (+)   Anesthesia Other Findings   Reproductive/Obstetrics                             Anesthesia Physical Anesthesia Plan  ASA: 3  Anesthesia Plan: General   Post-op Pain Management: Tylenol PO (pre-op)*, Dilaudid IV, Lidocaine infusion*, Ketamine IV* and Ofirmev IV (intra-op)*   Induction: Intravenous  PONV Risk Score and Plan: 4 or greater and Ondansetron, Dexamethasone, Treatment may vary due to age or medical condition, Propofol infusion, Midazolam, Scopolamine patch - Pre-op and Droperidol  Airway Management Planned: Oral ETT  Additional Equipment: None  Intra-op Plan:   Post-operative Plan: Extubation in OR  Informed Consent: I have reviewed the patients History and Physical, chart, labs and discussed the procedure including the risks, benefits and alternatives for the proposed anesthesia with the patient or  authorized representative who has indicated his/her understanding and acceptance.     Dental advisory given  Plan Discussed with: CRNA  Anesthesia Plan Comments: (PAT note by Antionette Poles, PA-C: 72 year old male follows with cardiology for history of abnormal stress test 2016, murmur, PVCs, HTN, HLD, OSA on CPAP.  Seen by Bernadene Person, NP on 05/24/2023 for preop evaluation.  Per note, "Abnormal stress test: Stress test in 2016 which suggested EF of 48%.  There was medium defect of moderate severity in the basal inferior, basal inferolateral and inferolateral location with question of peri-infarct ischemia. Managed medically. Stable with no anginal symptoms. No indication for ischemic evaluation.  Continue aspirin, amlodipine-olmesartan, hydrochlorothiazide, metoprolol, Lipitor.  Cardiac murmur: Echocardiogram in 2016 showed EF 55 to 60%, mild focal basal hypertrophy of the septum, no RWMA, G1 DD, no significant valvular abnormalities.  He does have a murmur on exam, asymptomatic.  Consider repeat echocardiogram as clinically indicated.Marland KitchenMarland KitchenPreoperative cardiac exam: According to the Revised Cardiac Risk Index (RCRI), his Perioperative Risk of Major Cardiac Event is (%): 0.4. His Functional Capacity in METs is: 6.27 according to the Duke Activity Status Index (DASI).Therefore, based on ACC/AHA guidelines, patient would be at acceptable risk for the planned procedure without further cardiovascular testing. Per office protocol, he may hold Aspirin for 5-7 days prior to procedure. Please resume Aspirin as soon as possible postprocedure, at the discretion of the surgeon. I will route this recommendation to the requesting party via Epic fax function."  CKD 3 by labs.  Insulin-dependent DM2.  A1c 7.2 on 05/29/2023.  GERD, maintained on esomeprazole.  Preop  labs reviewed, creatinine elevated 1.65 consistent with history (baseline appears to be ~1.7), otherwise unremarkable.  EKG 05/24/2023: Sinus rhythm with  Premature atrial complexes.  Rate 66. Left axis deviation  TTE 08/17/2015: - Left ventricle: The cavity size was normal. There was mild focal  basal hypertrophy of the septum. Systolic function was normal.  The estimated ejection fraction was in the range of 55% to 60%.  Wall motion was normal; there were no regional wall motion  abnormalities. Doppler parameters are consistent with abnormal  left ventricular relaxation (grade 1 diastolic dysfunction).  - Mitral valve: There was mild systolic anterior motion of the  chordal structures.   Impressions:   - Normal LV systolic function; mild basal septal hypertrophy; mild  chordal SAM; grade 1 diastolic dysfunction.   Nuclear stress 07/15/2015:  The left ventricular ejection fraction is mildly decreased (45-54%).  Nuclear stress EF: 48%.  Defect 1: There is a medium defect of moderate severity present in the basal inferior, basal inferolateral and mid inferolateral location.  This is an intermediate risk study.  Findings consistent with prior myocardial infarction with peri-infarct ischemia.   )        Anesthesia Quick Evaluation

## 2023-06-03 ENCOUNTER — Encounter (HOSPITAL_COMMUNITY): Payer: Self-pay | Admitting: Neurological Surgery

## 2023-06-03 ENCOUNTER — Ambulatory Visit (HOSPITAL_BASED_OUTPATIENT_CLINIC_OR_DEPARTMENT_OTHER): Payer: Medicare PPO

## 2023-06-03 ENCOUNTER — Ambulatory Visit (HOSPITAL_COMMUNITY): Payer: Medicare PPO

## 2023-06-03 ENCOUNTER — Inpatient Hospital Stay (HOSPITAL_COMMUNITY)
Admission: RE | Admit: 2023-06-03 | Discharge: 2023-06-06 | DRG: 402 | Disposition: A | Discharge status: Home / Self Care | Payer: Medicare PPO | Attending: Neurological Surgery | Admitting: Neurological Surgery

## 2023-06-03 ENCOUNTER — Other Ambulatory Visit: Payer: Self-pay

## 2023-06-03 ENCOUNTER — Ambulatory Visit (HOSPITAL_COMMUNITY): Payer: Medicare PPO | Admitting: Physician Assistant

## 2023-06-03 ENCOUNTER — Encounter (HOSPITAL_COMMUNITY)
Admission: RE | Disposition: A | Discharge status: Home / Self Care | Payer: Self-pay | Source: Home / Self Care | Attending: Neurological Surgery

## 2023-06-03 DIAGNOSIS — Z981 Arthrodesis status: Secondary | ICD-10-CM | POA: Diagnosis not present

## 2023-06-03 DIAGNOSIS — Z8249 Family history of ischemic heart disease and other diseases of the circulatory system: Secondary | ICD-10-CM

## 2023-06-03 DIAGNOSIS — E119 Type 2 diabetes mellitus without complications: Secondary | ICD-10-CM | POA: Diagnosis present

## 2023-06-03 DIAGNOSIS — M7138 Other bursal cyst, other site: Secondary | ICD-10-CM | POA: Diagnosis present

## 2023-06-03 DIAGNOSIS — M5416 Radiculopathy, lumbar region: Secondary | ICD-10-CM | POA: Diagnosis present

## 2023-06-03 DIAGNOSIS — E1122 Type 2 diabetes mellitus with diabetic chronic kidney disease: Secondary | ICD-10-CM

## 2023-06-03 DIAGNOSIS — G4733 Obstructive sleep apnea (adult) (pediatric): Secondary | ICD-10-CM | POA: Diagnosis present

## 2023-06-03 DIAGNOSIS — I129 Hypertensive chronic kidney disease with stage 1 through stage 4 chronic kidney disease, or unspecified chronic kidney disease: Secondary | ICD-10-CM

## 2023-06-03 DIAGNOSIS — K219 Gastro-esophageal reflux disease without esophagitis: Secondary | ICD-10-CM | POA: Diagnosis present

## 2023-06-03 DIAGNOSIS — W06XXXA Fall from bed, initial encounter: Secondary | ICD-10-CM | POA: Diagnosis not present

## 2023-06-03 DIAGNOSIS — E860 Dehydration: Secondary | ICD-10-CM | POA: Diagnosis not present

## 2023-06-03 DIAGNOSIS — I10 Essential (primary) hypertension: Secondary | ICD-10-CM | POA: Diagnosis present

## 2023-06-03 DIAGNOSIS — N189 Chronic kidney disease, unspecified: Secondary | ICD-10-CM | POA: Diagnosis not present

## 2023-06-03 DIAGNOSIS — M48061 Spinal stenosis, lumbar region without neurogenic claudication: Secondary | ICD-10-CM | POA: Diagnosis present

## 2023-06-03 DIAGNOSIS — Y92230 Patient room in hospital as the place of occurrence of the external cause: Secondary | ICD-10-CM | POA: Diagnosis not present

## 2023-06-03 DIAGNOSIS — R110 Nausea: Secondary | ICD-10-CM | POA: Diagnosis not present

## 2023-06-03 DIAGNOSIS — M4326 Fusion of spine, lumbar region: Secondary | ICD-10-CM | POA: Diagnosis not present

## 2023-06-03 HISTORY — PX: TRANSFORAMINAL LUMBAR INTERBODY FUSION W/ MIS 1 LEVEL: SHX6145

## 2023-06-03 LAB — GLUCOSE, CAPILLARY
Glucose-Capillary: 174 mg/dL — ABNORMAL HIGH (ref 70–99)
Glucose-Capillary: 204 mg/dL — ABNORMAL HIGH (ref 70–99)
Glucose-Capillary: 216 mg/dL — ABNORMAL HIGH (ref 70–99)
Glucose-Capillary: 200 mg/dL — ABNORMAL HIGH (ref 70–99)

## 2023-06-03 SURGERY — MINIMALLY INVASIVE (MIS) TRANSFORAMINAL LUMBAR INTERBODY FUSION (TLIF) 1 LEVEL
Anesthesia: General | Site: Spine Lumbar | Laterality: Left

## 2023-06-03 MED ORDER — CYCLOBENZAPRINE HCL 10 MG PO TABS
10.0000 mg | ORAL_TABLET | Freq: Three times a day (TID) | ORAL | Status: DC | PRN
  Administered 2023-06-03 – 2023-06-04 (×2): 10 mg via ORAL
  Filled 2023-06-03 (×2): qty 1

## 2023-06-03 MED ORDER — MENTHOL 3 MG MT LOZG
1.0000 | LOZENGE | OROMUCOSAL | Status: DC | PRN
  Filled 2023-06-03: qty 9

## 2023-06-03 MED ORDER — FENTANYL CITRATE (PF) 250 MCG/5ML IJ SOLN
INTRAMUSCULAR | Status: AC
  Filled 2023-06-03: qty 5

## 2023-06-03 MED ORDER — METFORMIN HCL 500 MG PO TABS
500.0000 mg | ORAL_TABLET | Freq: Two times a day (BID) | ORAL | Status: DC
  Administered 2023-06-03: 500 mg via ORAL
  Filled 2023-06-03: qty 1

## 2023-06-03 MED ORDER — ACETAMINOPHEN 650 MG RE SUPP: 650.0000 mg | RECTAL | Status: DC | PRN

## 2023-06-03 MED ORDER — LIDOCAINE-EPINEPHRINE 1 %-1:100000 IJ SOLN
INTRAMUSCULAR | Status: DC | PRN
  Administered 2023-06-03: 10 mL

## 2023-06-03 MED ORDER — PHENYLEPHRINE HCL-NACL 20-0.9 MG/250ML-% IV SOLN
INTRAVENOUS | Status: DC | PRN
  Administered 2023-06-03: 30 ug/min via INTRAVENOUS

## 2023-06-03 MED ORDER — DEXAMETHASONE SODIUM PHOSPHATE 10 MG/ML IJ SOLN
INTRAMUSCULAR | Status: AC
  Filled 2023-06-03: qty 1

## 2023-06-03 MED ORDER — ORAL CARE MOUTH RINSE: 15.0000 mL | Freq: Once | OROMUCOSAL | Status: AC

## 2023-06-03 MED ORDER — ACETAMINOPHEN 10 MG/ML IV SOLN
INTRAVENOUS | Status: AC
  Filled 2023-06-03: qty 100

## 2023-06-03 MED ORDER — LIDOCAINE-EPINEPHRINE 1 %-1:100000 IJ SOLN
INTRAMUSCULAR | Status: AC
  Filled 2023-06-03: qty 1

## 2023-06-03 MED ORDER — OXYCODONE HCL 5 MG PO TABS
10.0000 mg | ORAL_TABLET | ORAL | Status: DC | PRN
  Administered 2023-06-03 – 2023-06-04 (×5): 10 mg via ORAL
  Filled 2023-06-03 (×5): qty 2

## 2023-06-03 MED ORDER — POLYETHYLENE GLYCOL 3350 17 G PO PACK: 17.0000 g | PACK | Freq: Every day | ORAL | Status: DC | PRN

## 2023-06-03 MED ORDER — ONDANSETRON HCL 4 MG/2ML IJ SOLN
INTRAMUSCULAR | Status: AC
  Filled 2023-06-03: qty 2

## 2023-06-03 MED ORDER — HYDROMORPHONE HCL 1 MG/ML IJ SOLN
0.2500 mg | INTRAMUSCULAR | Status: DC | PRN
  Administered 2023-06-03 (×2): 0.25 mg via INTRAVENOUS

## 2023-06-03 MED ORDER — HYDROCHLOROTHIAZIDE 25 MG PO TABS
25.0000 mg | ORAL_TABLET | Freq: Every day | ORAL | Status: DC
  Administered 2023-06-04 – 2023-06-05 (×2): 25 mg via ORAL
  Filled 2023-06-03 (×2): qty 1

## 2023-06-03 MED ORDER — HYDROMORPHONE HCL 1 MG/ML IJ SOLN: 1.0000 mg | INTRAMUSCULAR | Status: DC | PRN

## 2023-06-03 MED ORDER — AMLODIPINE BESYLATE 10 MG PO TABS
10.0000 mg | ORAL_TABLET | Freq: Every day | ORAL | Status: DC
  Administered 2023-06-04 – 2023-06-05 (×2): 10 mg via ORAL
  Filled 2023-06-03 (×2): qty 1

## 2023-06-03 MED ORDER — OXYCODONE HCL 5 MG PO TABS: 5.0000 mg | ORAL_TABLET | ORAL | Status: DC | PRN

## 2023-06-03 MED ORDER — METFORMIN HCL 500 MG PO TABS
500.0000 mg | ORAL_TABLET | Freq: Two times a day (BID) | ORAL | Status: DC
  Administered 2023-06-04 – 2023-06-06 (×5): 500 mg via ORAL
  Filled 2023-06-03 (×5): qty 1

## 2023-06-03 MED ORDER — SODIUM CHLORIDE 0.9% FLUSH
3.0000 mL | Freq: Two times a day (BID) | INTRAVENOUS | Status: DC
  Administered 2023-06-03 – 2023-06-05 (×3): 3 mL via INTRAVENOUS

## 2023-06-03 MED ORDER — ATORVASTATIN CALCIUM 10 MG PO TABS
20.0000 mg | ORAL_TABLET | Freq: Every day | ORAL | Status: DC
  Administered 2023-06-04 – 2023-06-06 (×3): 20 mg via ORAL
  Filled 2023-06-03 (×3): qty 2

## 2023-06-03 MED ORDER — EPHEDRINE 5 MG/ML INJ
INTRAVENOUS | Status: AC
  Filled 2023-06-03: qty 5

## 2023-06-03 MED ORDER — EPHEDRINE SULFATE-NACL 50-0.9 MG/10ML-% IV SOSY
PREFILLED_SYRINGE | INTRAVENOUS | Status: DC | PRN
  Administered 2023-06-03: 10 mg via INTRAVENOUS
  Administered 2023-06-03 (×2): 5 mg via INTRAVENOUS

## 2023-06-03 MED ORDER — SCOPOLAMINE 1 MG/3DAYS TD PT72
MEDICATED_PATCH | TRANSDERMAL | Status: DC | PRN
  Administered 2023-06-03: 1 via TRANSDERMAL

## 2023-06-03 MED ORDER — ONDANSETRON HCL 4 MG/2ML IJ SOLN
INTRAMUSCULAR | Status: DC | PRN
  Administered 2023-06-03: 4 mg via INTRAVENOUS

## 2023-06-03 MED ORDER — CHLORHEXIDINE GLUCONATE CLOTH 2 % EX PADS: 6.0000 | MEDICATED_PAD | Freq: Once | CUTANEOUS | Status: DC

## 2023-06-03 MED ORDER — SODIUM CHLORIDE 0.9% FLUSH: 3.0000 mL | INTRAVENOUS | Status: DC | PRN

## 2023-06-03 MED ORDER — SERTRALINE HCL 50 MG PO TABS
50.0000 mg | ORAL_TABLET | Freq: Every day | ORAL | Status: DC
  Administered 2023-06-04 – 2023-06-06 (×3): 50 mg via ORAL
  Filled 2023-06-03 (×3): qty 1

## 2023-06-03 MED ORDER — METOPROLOL SUCCINATE ER 100 MG PO TB24
100.0000 mg | ORAL_TABLET | Freq: Every day | ORAL | Status: DC
  Administered 2023-06-04 – 2023-06-05 (×2): 100 mg via ORAL
  Filled 2023-06-03 (×2): qty 1

## 2023-06-03 MED ORDER — DROPERIDOL 2.5 MG/ML IJ SOLN
INTRAMUSCULAR | Status: AC
  Filled 2023-06-03: qty 2

## 2023-06-03 MED ORDER — SUGAMMADEX SODIUM 200 MG/2ML IV SOLN
INTRAVENOUS | Status: DC | PRN
  Administered 2023-06-03: 200 mg via INTRAVENOUS

## 2023-06-03 MED ORDER — ROCURONIUM BROMIDE 10 MG/ML (PF) SYRINGE
PREFILLED_SYRINGE | INTRAVENOUS | Status: AC
  Filled 2023-06-03: qty 10

## 2023-06-03 MED ORDER — DROPERIDOL 2.5 MG/ML IJ SOLN: 0.6250 mg | Freq: Once | INTRAMUSCULAR | Status: DC | PRN

## 2023-06-03 MED ORDER — KETAMINE HCL 10 MG/ML IJ SOLN
INTRAMUSCULAR | Status: DC | PRN
Start: 2023-06-03 — End: 2023-06-03
  Administered 2023-06-03 (×3): 10 mg via INTRAVENOUS
  Administered 2023-06-03: 20 mg via INTRAVENOUS

## 2023-06-03 MED ORDER — SENNA 8.6 MG PO TABS
1.0000 | ORAL_TABLET | Freq: Every day | ORAL | Status: DC | PRN
  Administered 2023-06-03: 17.2 mg via ORAL
  Filled 2023-06-03: qty 2

## 2023-06-03 MED ORDER — SUCCINYLCHOLINE CHLORIDE 200 MG/10ML IV SOSY
PREFILLED_SYRINGE | INTRAVENOUS | Status: AC
  Filled 2023-06-03: qty 10

## 2023-06-03 MED ORDER — LIDOCAINE 2% (20 MG/ML) 5 ML SYRINGE
INTRAMUSCULAR | Status: DC | PRN
  Administered 2023-06-03: 40 mg via INTRAVENOUS

## 2023-06-03 MED ORDER — PHENOL 1.4 % MT LIQD: 1.0000 | OROMUCOSAL | Status: DC | PRN

## 2023-06-03 MED ORDER — ACETAMINOPHEN 325 MG PO TABS
650.0000 mg | ORAL_TABLET | ORAL | Status: DC | PRN
  Administered 2023-06-04 – 2023-06-06 (×3): 650 mg via ORAL
  Filled 2023-06-03 (×3): qty 2

## 2023-06-03 MED ORDER — HYDROMORPHONE HCL 1 MG/ML IJ SOLN
INTRAMUSCULAR | Status: AC
  Filled 2023-06-03: qty 1

## 2023-06-03 MED ORDER — IRBESARTAN 150 MG PO TABS
300.0000 mg | ORAL_TABLET | Freq: Every day | ORAL | Status: DC
  Administered 2023-06-04 – 2023-06-05 (×2): 300 mg via ORAL
  Filled 2023-06-03 (×2): qty 2

## 2023-06-03 MED ORDER — THROMBIN 5000 UNITS EX SOLR
CUTANEOUS | Status: AC
  Filled 2023-06-03: qty 5000

## 2023-06-03 MED ORDER — ROCURONIUM BROMIDE 10 MG/ML (PF) SYRINGE
PREFILLED_SYRINGE | INTRAVENOUS | Status: DC | PRN
  Administered 2023-06-03 (×3): 20 mg via INTRAVENOUS
  Administered 2023-06-03: 10 mg via INTRAVENOUS
  Administered 2023-06-03: 30 mg via INTRAVENOUS

## 2023-06-03 MED ORDER — ACETAMINOPHEN 10 MG/ML IV SOLN
1000.0000 mg | Freq: Once | INTRAVENOUS | Status: DC | PRN
  Administered 2023-06-03: 1000 mg via INTRAVENOUS

## 2023-06-03 MED ORDER — GLYCOPYRROLATE PF 0.2 MG/ML IJ SOSY
PREFILLED_SYRINGE | INTRAMUSCULAR | Status: AC
  Filled 2023-06-03: qty 1

## 2023-06-03 MED ORDER — PROPOFOL 10 MG/ML IV BOLUS
INTRAVENOUS | Status: AC
  Filled 2023-06-03: qty 20

## 2023-06-03 MED ORDER — METOPROLOL SUCCINATE ER 25 MG PO TB24
ORAL_TABLET | ORAL | Status: AC
  Administered 2023-06-03: 100 mg via ORAL
  Filled 2023-06-03: qty 4

## 2023-06-03 MED ORDER — LACTATED RINGERS IV SOLN
INTRAVENOUS | Status: DC | PRN
Start: 2023-06-03 — End: 2023-06-03

## 2023-06-03 MED ORDER — AMLODIPINE-OLMESARTAN 10-40 MG PO TABS: 1.0000 | ORAL_TABLET | Freq: Every day | ORAL | Status: DC

## 2023-06-03 MED ORDER — ONDANSETRON HCL 4 MG PO TABS
4.0000 mg | ORAL_TABLET | Freq: Four times a day (QID) | ORAL | Status: DC | PRN
  Administered 2023-06-04: 4 mg via ORAL
  Filled 2023-06-03: qty 1

## 2023-06-03 MED ORDER — PROPOFOL 10 MG/ML IV BOLUS
INTRAVENOUS | Status: DC | PRN
  Administered 2023-06-03: 50 ug/kg/min via INTRAVENOUS
  Administered 2023-06-03: 150 mg via INTRAVENOUS
  Administered 2023-06-03: 125 ug/kg/min via INTRAVENOUS

## 2023-06-03 MED ORDER — SUCCINYLCHOLINE CHLORIDE 200 MG/10ML IV SOSY
PREFILLED_SYRINGE | INTRAVENOUS | Status: DC | PRN
  Administered 2023-06-03: 140 mg via INTRAVENOUS

## 2023-06-03 MED ORDER — GLYCOPYRROLATE 0.2 MG/ML IJ SOLN
INTRAMUSCULAR | Status: DC | PRN
Start: 2023-06-03 — End: 2023-06-03
  Administered 2023-06-03: .2 mg via INTRAVENOUS

## 2023-06-03 MED ORDER — ACETAMINOPHEN 160 MG/5ML PO SOLN: 325.0000 mg | Freq: Once | ORAL | Status: DC | PRN

## 2023-06-03 MED ORDER — CEFAZOLIN SODIUM-DEXTROSE 2-4 GM/100ML-% IV SOLN
2.0000 g | INTRAVENOUS | Status: AC
  Administered 2023-06-03: 2 g via INTRAVENOUS
  Filled 2023-06-03: qty 100

## 2023-06-03 MED ORDER — DROPERIDOL 2.5 MG/ML IJ SOLN
INTRAMUSCULAR | Status: DC | PRN
Start: 2023-06-03 — End: 2023-06-03
  Administered 2023-06-03: .625 mg via INTRAVENOUS

## 2023-06-03 MED ORDER — INSULIN ASPART 100 UNIT/ML IJ SOLN: 0.0000 [IU] | INTRAMUSCULAR | Status: DC | PRN

## 2023-06-03 MED ORDER — THROMBIN 5000 UNITS EX SOLR: OROMUCOSAL | Status: DC | PRN

## 2023-06-03 MED ORDER — KETAMINE HCL 50 MG/5ML IJ SOSY
PREFILLED_SYRINGE | INTRAMUSCULAR | Status: AC
  Filled 2023-06-03: qty 5

## 2023-06-03 MED ORDER — LACTATED RINGERS IV SOLN: INTRAVENOUS | Status: DC

## 2023-06-03 MED ORDER — METOPROLOL SUCCINATE ER 25 MG PO TB24: 100.0000 mg | ORAL_TABLET | Freq: Once | ORAL | Status: AC

## 2023-06-03 MED ORDER — HYDROCHLOROTHIAZIDE 25 MG PO TABS: 25.0000 mg | ORAL_TABLET | Freq: Every day | ORAL | Status: DC

## 2023-06-03 MED ORDER — ONDANSETRON HCL 4 MG/2ML IJ SOLN
4.0000 mg | Freq: Four times a day (QID) | INTRAMUSCULAR | Status: DC | PRN
  Administered 2023-06-05: 4 mg via INTRAVENOUS
  Filled 2023-06-03: qty 2

## 2023-06-03 MED ORDER — PANTOPRAZOLE SODIUM 40 MG PO TBEC
40.0000 mg | DELAYED_RELEASE_TABLET | Freq: Every day | ORAL | Status: DC
  Administered 2023-06-03 – 2023-06-06 (×4): 40 mg via ORAL
  Filled 2023-06-03 (×4): qty 1

## 2023-06-03 MED ORDER — HYDROMORPHONE HCL 1 MG/ML IJ SOLN
INTRAMUSCULAR | Status: DC | PRN
Start: 2023-06-03 — End: 2023-06-03
  Administered 2023-06-03: .5 mg via INTRAVENOUS

## 2023-06-03 MED ORDER — LIDOCAINE 2% (20 MG/ML) 5 ML SYRINGE
INTRAMUSCULAR | Status: AC
  Filled 2023-06-03: qty 5

## 2023-06-03 MED ORDER — CHLORHEXIDINE GLUCONATE 0.12 % MT SOLN
15.0000 mL | Freq: Once | OROMUCOSAL | Status: AC
  Administered 2023-06-03: 15 mL via OROMUCOSAL
  Filled 2023-06-03: qty 15

## 2023-06-03 MED ORDER — HYDROMORPHONE HCL 1 MG/ML IJ SOLN
INTRAMUSCULAR | Status: AC
  Filled 2023-06-03: qty 0.5

## 2023-06-03 MED ORDER — GLIPIZIDE 5 MG PO TABS
10.0000 mg | ORAL_TABLET | Freq: Two times a day (BID) | ORAL | Status: DC
  Administered 2023-06-03 – 2023-06-06 (×6): 10 mg via ORAL
  Filled 2023-06-03 (×8): qty 2

## 2023-06-03 MED ORDER — FENTANYL CITRATE (PF) 250 MCG/5ML IJ SOLN
INTRAMUSCULAR | Status: DC | PRN
  Administered 2023-06-03 (×3): 50 ug via INTRAVENOUS
  Administered 2023-06-03: 100 ug via INTRAVENOUS

## 2023-06-03 MED ORDER — TEMAZEPAM 15 MG PO CAPS: 30.0000 mg | ORAL_CAPSULE | Freq: Every evening | ORAL | Status: DC | PRN

## 2023-06-03 MED ORDER — 0.9 % SODIUM CHLORIDE (POUR BTL) OPTIME
TOPICAL | Status: DC | PRN
  Administered 2023-06-03: 1000 mL

## 2023-06-03 MED ORDER — INSULIN ASPART 100 UNIT/ML IJ SOLN
0.0000 [IU] | Freq: Three times a day (TID) | INTRAMUSCULAR | Status: DC
  Administered 2023-06-03: 5 [IU] via SUBCUTANEOUS

## 2023-06-03 MED ORDER — DEXAMETHASONE SODIUM PHOSPHATE 10 MG/ML IJ SOLN
INTRAMUSCULAR | Status: DC | PRN
  Administered 2023-06-03: 8 mg via INTRAVENOUS

## 2023-06-03 MED ORDER — DOCUSATE SODIUM 100 MG PO CAPS
100.0000 mg | ORAL_CAPSULE | Freq: Two times a day (BID) | ORAL | Status: DC
  Administered 2023-06-03 – 2023-06-06 (×7): 100 mg via ORAL
  Filled 2023-06-03 (×7): qty 1

## 2023-06-03 MED ORDER — ACETAMINOPHEN 325 MG PO TABS: 325.0000 mg | ORAL_TABLET | Freq: Once | ORAL | Status: DC | PRN

## 2023-06-03 MED ORDER — CEFAZOLIN SODIUM-DEXTROSE 2-4 GM/100ML-% IV SOLN
2.0000 g | Freq: Three times a day (TID) | INTRAVENOUS | Status: AC
  Administered 2023-06-03 (×2): 2 g via INTRAVENOUS
  Filled 2023-06-03 (×2): qty 100

## 2023-06-03 SURGICAL SUPPLY — 64 items
ADH SKN CLS APL DERMABOND .7 (GAUZE/BANDAGES/DRESSINGS) ×2
BAG COUNTER SPONGE SURGICOUNT (BAG) ×2 IMPLANT
BAG SPNG CNTER NS LX DISP (BAG) ×4
BASKET BONE COLLECTION (BASKET) ×2 IMPLANT
BLADE SURG 11 STRL SS (BLADE) ×2 IMPLANT
BUR 14 MATCH 3 (BUR) IMPLANT
BUR MATCHSTICK NEURO 3.0 LAGG (BURR) IMPLANT
BURR 14 MATCH 3 (BUR) ×2
CAGE INTERBODY PL LG 7X26.5X24 (Cage) IMPLANT
CNTNR URN SCR LID CUP LEK RST (MISCELLANEOUS) ×2 IMPLANT
CONT SPEC 4OZ STRL OR WHT (MISCELLANEOUS) ×2
COVER BACK TABLE 60X90IN (DRAPES) ×2 IMPLANT
COVERAGE SUPPORT O-ARM STEALTH (MISCELLANEOUS) ×2
DERMABOND ADVANCED .7 DNX12 (GAUZE/BANDAGES/DRESSINGS) ×2 IMPLANT
DRAPE C-ARM 42X72 X-RAY (DRAPES) ×2 IMPLANT
DRAPE C-ARMOR (DRAPES) ×2 IMPLANT
DRAPE LAPAROTOMY 100X72X124 (DRAPES) ×2 IMPLANT
DRAPE MICROSCOPE SLANT 54X150 (MISCELLANEOUS) ×2 IMPLANT
DRAPE SHEET LG 3/4 BI-LAMINATE (DRAPES) ×8 IMPLANT
ELECT BLADE 6.5 EXT (BLADE) ×2 IMPLANT
ELECT REM PT RETURN 9FT ADLT (ELECTROSURGICAL) ×2
ELECTRODE REM PT RTRN 9FT ADLT (ELECTROSURGICAL) ×2 IMPLANT
FEE COVERAGE SUPPORT O-ARM (MISCELLANEOUS) ×2 IMPLANT
GAUZE 4X4 16PLY ~~LOC~~+RFID DBL (SPONGE) IMPLANT
GAUZE SPONGE 4X4 12PLY STRL (GAUZE/BANDAGES/DRESSINGS) ×2 IMPLANT
GLOVE BIO SURGEON STRL SZ7.5 (GLOVE) ×2 IMPLANT
GLOVE BIOGEL PI IND STRL 7.5 (GLOVE) ×4 IMPLANT
GLOVE ECLIPSE 7.5 STRL STRAW (GLOVE) ×2 IMPLANT
GOWN STRL REUS W/ TWL LRG LVL3 (GOWN DISPOSABLE) ×2 IMPLANT
GOWN STRL REUS W/ TWL XL LVL3 (GOWN DISPOSABLE) IMPLANT
GOWN STRL REUS W/TWL 2XL LVL3 (GOWN DISPOSABLE) IMPLANT
GOWN STRL REUS W/TWL LRG LVL3 (GOWN DISPOSABLE) ×4
GOWN STRL REUS W/TWL XL LVL3 (GOWN DISPOSABLE) ×8
HEMOSTAT POWDER KIT SURGIFOAM (HEMOSTASIS) ×2 IMPLANT
KIT BASIN OR (CUSTOM PROCEDURE TRAY) ×2 IMPLANT
KIT INFUSE X SMALL 1.4CC (Orthopedic Implant) IMPLANT
KIT POSITION SURG JACKSON T1 (MISCELLANEOUS) ×2 IMPLANT
KIT TURNOVER KIT B (KITS) IMPLANT
MARKER SPHERE PSV REFLC NDI (MISCELLANEOUS) ×10 IMPLANT
NDL HYPO 18GX1.5 BLUNT FILL (NEEDLE) IMPLANT
NDL HYPO 22X1.5 SAFETY MO (MISCELLANEOUS) ×2 IMPLANT
NDL SPNL 18GX3.5 QUINCKE PK (NEEDLE) IMPLANT
NEEDLE HYPO 18GX1.5 BLUNT FILL (NEEDLE)
NEEDLE HYPO 22X1.5 SAFETY MO (MISCELLANEOUS) ×2
NEEDLE SPNL 18GX3.5 QUINCKE PK (NEEDLE)
NS IRRIG 1000ML POUR BTL (IV SOLUTION) ×2 IMPLANT
PACK LAMINECTOMY NEURO (CUSTOM PROCEDURE TRAY) ×2 IMPLANT
PAD ARMBOARD 7.5X6 YLW CONV (MISCELLANEOUS) ×4 IMPLANT
ROD SOLERA 70MM (Rod) ×4 IMPLANT
ROD SOLERA 70X5.5XNS TI (Rod) IMPLANT
SCREW SET SOLERA (Screw) ×12 IMPLANT
SCREW SET SOLERA TI5.5 (Screw) IMPLANT
SCREW SOLERA 45X5.5XMA NS SPNE (Screw) IMPLANT
SCREW SOLERA 5.5X45MM (Screw) ×4 IMPLANT
SPONGE T-LAP 4X18 ~~LOC~~+RFID (SPONGE) IMPLANT
SUT MNCRL AB 3-0 PS2 18 (SUTURE) ×2 IMPLANT
SUT VIC AB 0 CT1 18XCR BRD8 (SUTURE) IMPLANT
SUT VIC AB 0 CT1 8-18 (SUTURE)
SUT VIC AB 2-0 CP2 18 (SUTURE) ×2 IMPLANT
SYR 3ML LL SCALE MARK (SYRINGE) IMPLANT
TOWEL GREEN STERILE (TOWEL DISPOSABLE) ×2 IMPLANT
TOWEL GREEN STERILE FF (TOWEL DISPOSABLE) ×2 IMPLANT
TRAY FOLEY MTR SLVR 16FR STAT (SET/KITS/TRAYS/PACK) IMPLANT
WATER STERILE IRR 1000ML POUR (IV SOLUTION) ×2 IMPLANT

## 2023-06-03 NOTE — Anesthesia Postprocedure Evaluation (Signed)
Anesthesia Post Note  Patient: AMARIYON MAYNES  Procedure(s) Performed: Left Lumbar Two-Three Minimally Invasive Decompression and Transforaminal Lumbar Interbody Fusion with Extension of Posterolateral Fusion from Lumbar Two-Four (Left: Spine Lumbar) Application of O-Arm (Left)     Patient location during evaluation: PACU Anesthesia Type: General Level of consciousness: awake and alert Pain management: pain level controlled Vital Signs Assessment: post-procedure vital signs reviewed and stable Respiratory status: spontaneous breathing, nonlabored ventilation, respiratory function stable and patient connected to nasal cannula oxygen Cardiovascular status: blood pressure returned to baseline and stable Postop Assessment: no apparent nausea or vomiting Anesthetic complications: no  No notable events documented.  Last Vitals:  Vitals:   06/03/23 1415 06/03/23 1450  BP: 123/69 (!) 114/90  Pulse: (!) 59 63  Resp: 17 20  Temp: (!) 36.4 C   SpO2: 95% 97%    Last Pain:  Vitals:   06/03/23 1455  TempSrc:   PainSc: 7                  Shelton Silvas

## 2023-06-03 NOTE — Transfer of Care (Signed)
Immediate Anesthesia Transfer of Care Note  Patient: KIN GALBRAITH  Procedure(s) Performed: Left Lumbar Two-Three Minimally Invasive Decompression and Transforaminal Lumbar Interbody Fusion with Extension of Posterolateral Fusion from Lumbar Two-Four (Left: Spine Lumbar) Application of O-Arm (Left)  Patient Location: PACU  Anesthesia Type:General  Level of Consciousness: sedated and drowsy  Airway & Oxygen Therapy: Patient Spontanous Breathing and Patient connected to face mask oxygen  Post-op Assessment: Report given to RN and Post -op Vital signs reviewed and stable  Post vital signs: Reviewed and stable  Last Vitals:  Vitals Value Taken Time  BP 132/82 06/03/23 1231  Temp 97.4   Pulse 73 06/03/23 1233  Resp 17 06/03/23 1233  SpO2 98 % 06/03/23 1233  Vitals shown include unfiled device data.  Last Pain:  Vitals:   06/03/23 0556  TempSrc:   PainSc: 0-No pain         Complications: No notable events documented.

## 2023-06-03 NOTE — Anesthesia Procedure Notes (Signed)
Procedure Name: Intubation Date/Time: 06/03/2023 7:56 AM  Performed by: Pincus Large, CRNAPre-anesthesia Checklist: Patient identified, Emergency Drugs available, Suction available and Patient being monitored Patient Re-evaluated:Patient Re-evaluated prior to induction Oxygen Delivery Method: Circle System Utilized Preoxygenation: Pre-oxygenation with 100% oxygen Induction Type: IV induction Ventilation: Mask ventilation without difficulty Laryngoscope Size: Mac and 4 Grade View: Grade II Tube type: Oral Tube size: 7.5 mm Number of attempts: 1 Airway Equipment and Method: Stylet and Oral airway Placement Confirmation: ETT inserted through vocal cords under direct vision, positive ETCO2 and breath sounds checked- equal and bilateral Secured at: 23 cm Tube secured with: Tape Dental Injury: Teeth and Oropharynx as per pre-operative assessment

## 2023-06-03 NOTE — Op Note (Signed)
PATIENT: Justin Herrera  DAY OF SURGERY: 06/03/23   PRE-OPERATIVE DIAGNOSIS:  Synovial cyst, severe lumbar stenosis, adjacent segment disease, lumbar radiculopathy   POST-OPERATIVE DIAGNOSIS:  Same   PROCEDURE:  L2-3 minimally invasive laminectomy and transforaminal lumbar interbody fusion, extension of fusion from L3-4 to L2-L3-L4, use of frameless stereotaxy and intra-operative CT scan   SURGEON:  Surgeon(s) and Role:    Jadene Pierini, MD    Emilee Hero PA   ANESTHESIA: ETGA   BRIEF HISTORY: This is a 72 year old man in whom I previously performed an MIS L3-4 decompression and TLIF. He did very well post-op until a few months post-op when he began having recurrence of his radicular pain. 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 I therefore recommended MIS decompression and TLIF at L2-3 and connecting to his existing hardware at L3-4.    OPERATIVE DETAIL:  The patient was taken to the operating room and placed on the OR table in the prone position. A formal time out was performed with two patient identifiers and confirmed the operative site. Anesthesia was induced by the anesthesia team. The operative site was marked, hair was clipped with surgical clippers, the area was then prepped and draped in a sterile fashion.   The prior bilateral MIS paramedian incisions from the L3-4 TLIF were marked and extended cranially for the procedure. A clamp was attached to the existing rod on the right to allow for a left sided TLIF approach and the left sided caps and rod were removed.   The field was covered and the O-arm was brought into the field. An intraoperative CT was obtained and transferred to the Stealth station, which was registered to the patient's anatomy using the registration frame. The fit appeared to be acceptable. Stereotactic spinal navigation was utilized throughout the procedure for planning and placement of pedicle screw  trajectories.   Pedicle screws were placed at L2 bilaterally using stereotactic navigation. These were placed by creating a navigated pilot hole then using a navigated awl-tap, palpated, and the screws were placed.   Using stereotactic guidance, a [employer] tube was then docked to the left L2-L3 facet and decompression was performed.  An L2-L3 laminectomy was performed by removing the lamina ipsilaterally followed by ligamentum flavum, following this contralaterally to the contralateral lateral recess, and then inferiorly to the ligamentous insertion on the inferior lamina. As expected, there was a large synovial cyst which was drained then resected off of the dura. However, as opposed to typical synovium, this was filled with thick mucinous material mixed with dark material that looked like old blood products. The capsule was a few millimeters thick and quite adherent. This made the decompression and, surprisingly, the TLIF much more difficult. I had to removed much of the right sided facet from the left to get around that material. On the left, this also extended out to the foramen and made dissecting the exiting root much more difficult. It enveloped essentially the entire epidural space and made discerning tissues quite difficult, which necessitated additional time under the microscope carefully dissecting. I was able to get it free without a durotomy thankfully.   A left L2-L3 facetectomy was then performed and the left L2 nerve root was eventually identified after freeing things up. The disc space was identified, incised, and a discectomy was performed in the standard fashion. Using navigated instruments, the endplates were prepped, bone graft was packed into the disc space, and  an expandable cage ([employer]) was packed with autograft and placed into the disc space using navigation. The tube was removed and hemostasis was obtained during its removal.   The posterolateral fusion surfaces were decorticated  on the right. A rod was sized and introduced on both sides then final tightened. A final xray was used to confirm that the hardware was in good position. Hemostasis was again confirmed for both incisions, they were copiously irrigated, and then closed in layers. rBMP was used in the disc space as well in the right posterolateral fusion surface with autograft placed in both locations.  Alli Consentino PA was scrubbed and assisted with the procedure which included decompression, placement of hardware, and closure.   EBL:    DRAINS: none   SPECIMENS: none   Jadene Pierini, MD

## 2023-06-03 NOTE — H&P (Signed)
Surgical H&P Update  HPI: 72 y.o. with a history of bilateral lower extremity pain. Workup showed a large synovial cyst with adjacent segment disease and severe stenosis. No changes in health since they were last seen. Still having the above and wishes to proceed with surgery.  PMHx:  Past Medical History:  Diagnosis Date   Anxiety    on meds   Arthritis    generalized   Depression    on meds   Diabetes mellitus    Type II-on meds   GERD (gastroesophageal reflux disease)    Headache    Heart murmur    slight murmur noted per pt   Hypertension    on meds   Insomnia    PONV (postoperative nausea and vomiting)    Sleep apnea, obstructive    wears a CPAP   Symptomatic cholelithiasis    FamHx:  Family History  Problem Relation Age of Onset   Hypertension Mother    Hypertension Father    Heart attack Maternal Grandfather 2       Died suddenly "family lore suggested an MI"   Colon cancer Neg Hx    Stomach cancer Neg Hx    Colon polyps Neg Hx    Esophageal cancer Neg Hx    Rectal cancer Neg Hx    SocHx:  reports that he has never smoked. He has never used smokeless tobacco. He reports that he does not currently use alcohol. He reports that he does not use drugs.  Physical Exam: Strength 5/5 x4 and SILTx4   Assesment/Plan: 72 y.o. man with rapidly progressive adjacent segment dz with large synovial cyst and severe canal stenosis, here for MIS decompression, MIS TLIF, extension of fusion. Risks, benefits, and alternatives discussed and the patient would like to continue with surgery.  -OR today -3C post-op  Jadene Pierini, MD 06/03/23 7:36 AM

## 2023-06-04 LAB — GLUCOSE, CAPILLARY
Glucose-Capillary: 154 mg/dL — ABNORMAL HIGH (ref 70–99)
Glucose-Capillary: 157 mg/dL — ABNORMAL HIGH (ref 70–99)
Glucose-Capillary: 154 mg/dL — ABNORMAL HIGH (ref 70–99)
Glucose-Capillary: 118 mg/dL — ABNORMAL HIGH (ref 70–99)

## 2023-06-04 MED ORDER — HYDROXYZINE HCL 50 MG/ML IM SOLN
50.0000 mg | Freq: Four times a day (QID) | INTRAMUSCULAR | Status: DC | PRN
  Administered 2023-06-04 (×2): 50 mg via INTRAMUSCULAR
  Filled 2023-06-04 (×2): qty 1

## 2023-06-04 MED ORDER — OXYCODONE HCL 5 MG PO TABS: 5.0000 mg | ORAL_TABLET | ORAL | 0 refills | Status: DC | PRN

## 2023-06-04 MED ORDER — CHLORPROMAZINE HCL 25 MG PO TABS
25.0000 mg | ORAL_TABLET | Freq: Four times a day (QID) | ORAL | Status: DC | PRN
  Administered 2023-06-04: 25 mg via ORAL
  Filled 2023-06-04 (×2): qty 1

## 2023-06-04 MED ORDER — HYDROCODONE-ACETAMINOPHEN 5-325 MG PO TABS
1.0000 | ORAL_TABLET | ORAL | Status: DC | PRN
  Administered 2023-06-04: 2 via ORAL
  Administered 2023-06-05 – 2023-06-06 (×4): 1 via ORAL
  Filled 2023-06-04 (×3): qty 1
  Filled 2023-06-04: qty 2
  Filled 2023-06-04: qty 1

## 2023-06-04 NOTE — Progress Notes (Signed)
Neurosurgery Service Progress Note  Subjective: No acute events overnight, leg pain resolved, back pain as expected, some nausea that's improving   Objective: Vitals:   06/03/23 1954 06/03/23 2335 06/04/23 0302 06/04/23 0755  BP: (!) 171/92 (!) 145/74 (!) 151/84 110/65  Pulse: 79 87 89 86  Resp: 20 20 18 18   Temp: 98.2 F (36.8 C) 99.1 F (37.3 C) 99.9 F (37.7 C) 99.1 F (37.3 C)  TempSrc: Oral Oral Oral Oral  SpO2: 99% 97% 91% 98%  Weight:      Height:        Physical Exam: Strength 5/5 x4 and SILTx4, incisions c/d/i  Assessment & Plan: 72 y.o. man s/p resection of synovial cyst and extension of fusion, recovering well.  -discharge home today -discussed the nausea, he's comfortable going home, discussed precautions / stay hydrated, call if he's having difficulty  Jadene Pierini  06/04/23 11:26 AM

## 2023-06-04 NOTE — Progress Notes (Signed)
MD notified at 2147 and pt's spouse notified at 2153 about pt's fall. No new orders at this time. Bed alarm set.   Dr. Maurice Small later notified by this RN at 2227 while MD had called this unit in regards to a separate matter. Will continue to monitor.

## 2023-06-04 NOTE — Discharge Summary (Signed)
Discharge Summary  Date of Admission: 06/03/2023  Date of Discharge: 06/04/23  Attending Physician: Autumn Patty, MD  Hospital Course: Patient was admitted following an uncomplicated extension of fusion to L3-4 and resection of a large synovial cyst. They were recovered in PACU and transferred to [facility]. Their preop symptoms were improved, their hospital course was uncomplicated and the patient was discharged home. They will follow up in clinic with me in clinic in 2 weeks.  Neurologic exam at discharge:  Strength 5/5 x4 and SILTx4   Discharge diagnosis: Lumbar synovial cyst  Jadene Pierini, MD 06/04/23 11:27 AM

## 2023-06-04 NOTE — Progress Notes (Signed)
CPAP setup at bedside.  RT available if patient needs assistance.

## 2023-06-04 NOTE — Evaluation (Signed)
Physical Therapy Evaluation  Patient Details Name: Justin Herrera MRN: [id number] DOB: Jul 29, 1951 Today's Date: 06/04/2023  History of Present Illness  Pt is a 72 y/o male who presents s/p L3-L4 TLIF on 06/03/2023. PMH significant for DM, HTN, TLIF in 2023.  Clinical Impression  Pt admitted with above diagnosis. At the time of PT eval, pt was able to demonstrate transfers and ambulation with gross CGA and RW for support. Pt vomiting at beginning of session. Significant bending and twisting throughout mobility despite cues. Pt was educated on precautions, positioning recommendations, appropriate activity progression, and car transfer. Pt currently with functional limitations due to the deficits listed below (see PT Problem List). Pt will benefit from skilled PT to increase their independence and safety with mobility to allow discharge to the venue listed below.          If plan is discharge home, recommend the following: A little help with walking and/or transfers;A little help with bathing/dressing/bathroom;Assistance with cooking/housework;Assist for transportation;Help with stairs or ramp for entrance   Can travel by private vehicle        Equipment Recommendations None recommended by PT  Recommendations for Other Services       Functional Status Assessment Patient has had a recent decline in their functional status and demonstrates the ability to make significant improvements in function in a reasonable and predictable amount of time.     Precautions / Restrictions Precautions Precautions: Fall;Back Precaution Booklet Issued: Yes (comment) Precaution Comments: Reviewed precautions verbally during functional mobility. Poor adherence to precautions throughout. Required Braces or Orthoses:  (No brace needed order) Restrictions Weight Bearing Restrictions: No      Mobility  Bed Mobility Overal bed mobility: Needs Assistance Bed Mobility: Rolling, Sidelying to Sit, Sit to  Sidelying Rolling: Supervision Sidelying to sit: Supervision     Sit to sidelying: Supervision General bed mobility comments: Heavy use of rails for roll and trunk elevation to full sitting position. VC's throughout for optimal log roll technique.    Transfers Overall transfer level: Needs assistance Equipment used: Rolling walker (2 wheels) Transfers: Sit to/from Stand Sit to Stand: Contact guard assist           General transfer comment: VC's for hand placement on seated surface for safety. Increased time and effort to power up and achieve full stand.    Ambulation/Gait Ambulation/Gait assistance: Contact guard assist Gait Distance (Feet): 275 Feet Assistive device: Rolling walker (2 wheels) Gait Pattern/deviations: Step-through pattern, Decreased stride length, Trunk flexed, Narrow base of support Gait velocity: Decreased Gait velocity interpretation: 1.31 - 2.62 ft/sec, indicative of limited community ambulator   General Gait Details: VC's for improved posture, closer walker proximity and forward gaze. Pt with poor adherence to precautions, bending and twisting throughout mobility.  Stairs Stairs: Yes Stairs assistance: Contact guard assist Stair Management: One rail Right, Step to pattern, Forwards Number of Stairs: 10 General stair comments: VC's for sequencing and general safety. Pt with significant trunk flexion throughout despite cues for precautions.  Wheelchair Mobility     Tilt Bed    Modified Rankin (Stroke Patients Only)       Balance Overall balance assessment: Needs assistance Sitting-balance support: Feet supported, No upper extremity supported Sitting balance-Leahy Scale: Fair     Standing balance support: No upper extremity supported, During functional activity, Reliant on assistive device for balance Standing balance-Leahy Scale: Poor Standing balance comment: Needs at least 1 UE support for standing balance.  Pertinent Vitals/Pain Pain Assessment Pain Assessment: Faces Faces Pain Scale: Hurts even more Pain Location: Incision site Pain Descriptors / Indicators: Operative site guarding, Sore Pain Intervention(s): Limited activity within patient's tolerance, Monitored during session, Repositioned    Home Living Family/patient expects to be discharged to:: Private residence Living Arrangements: Spouse/significant other Available Help at Discharge: Family;Available 24 hours/day Type of Home: House Home Access: Stairs to enter Entrance Stairs-Rails: None Entrance Stairs-Number of Steps: 2 Alternate Level Stairs-Number of Steps: 18 Home Layout: Two level;Bed/bath upstairs Home Equipment: Agricultural consultant (2 wheels);Adaptive equipment;Shower seat      Prior Function Prior Level of Function : Independent/Modified Independent                     Extremity/Trunk Assessment   Upper Extremity Assessment Upper Extremity Assessment: Defer to OT evaluation    Lower Extremity Assessment Lower Extremity Assessment: Generalized weakness (Mild; consistent with pre-op diagnosis)    Cervical / Trunk Assessment Cervical / Trunk Assessment: Back Surgery  Communication   Communication Communication: No apparent difficulties Cueing Techniques: Verbal cues;Gestural cues  Cognition Arousal: Alert Behavior During Therapy: WFL for tasks assessed/performed Overall Cognitive Status: Within Functional Limits for tasks assessed                                          General Comments General comments (skin integrity, edema, etc.): Pt with vomiting at beginning of session    Exercises     Assessment/Plan    PT Assessment Patient needs continued PT services  PT Problem List Decreased strength;Decreased activity tolerance;Decreased balance;Decreased mobility;Decreased knowledge of use of DME;Decreased safety awareness;Decreased knowledge of precautions;Pain        PT Treatment Interventions DME instruction;Gait training;Stair training;Functional mobility training;Therapeutic activities;Therapeutic exercise;Balance training;Patient/family education    PT Goals (Current goals can be found in the Care Plan section)  Acute Rehab PT Goals Patient Stated Goal: Home today PT Goal Formulation: With patient Time For Goal Achievement: 06/11/23 Potential to Achieve Goals: Good    Frequency Min 5X/week     Co-evaluation               AM-PAC PT "6 Clicks" Mobility  Outcome Measure Help needed turning from your back to your side while in a flat bed without using bedrails?: A Little Help needed moving from lying on your back to sitting on the side of a flat bed without using bedrails?: A Little Help needed moving to and from a bed to a chair (including a wheelchair)?: A Little Help needed standing up from a chair using your arms (e.g., wheelchair or bedside chair)?: A Little Help needed to walk in hospital room?: A Little Help needed climbing 3-5 steps with a railing? : A Little 6 Click Score: 18    End of Session Equipment Utilized During Treatment: Gait belt Activity Tolerance: Patient tolerated treatment well Patient left: in bed;with call bell/phone within reach Nurse Communication: Mobility status PT Visit Diagnosis: Unsteadiness on feet (R26.81);Pain Pain - part of body:  (back)    Time: 4401-0272 PT Time Calculation (min) (ACUTE ONLY): 15 min   Charges:   PT Evaluation $PT Eval Low Complexity: 1 Low   PT General Charges $$ ACUTE PT VISIT: 1 Visit         Conni Slipper, PT, DPT Acute Rehabilitation Services Secure Chat Preferred Office: [phone]   Marylynn Pearson 06/04/2023,  10:33 AM

## 2023-06-04 NOTE — Progress Notes (Signed)
Pt "slid to the ground" while sitting on the edge of the bed. Pt denies hitting his head. VS stable and CBG = 118. Pt without any observable bruising, redness, or swelling. Pt is neuro intact. Will page on-call MD and notify pt's spouse.

## 2023-06-04 NOTE — Evaluation (Signed)
Occupational Therapy Evaluation Patient Details Name: Justin Herrera MRN: [id number] DOB: 1951/02/02 Today's Date: 06/04/2023   History of Present Illness Pt is a 72 y/o male who presents s/p L3-L4 TLIF on 06/03/2023. PMH significant for DM, HTN, TLIF in 2023.   Clinical Impression   Patient is s/p TLIF L3-4 surgery resulting in functional limitations due to the deficits listed below (see OT problem list). Pt at this time needs continuous cues for back precaution during functional task. Pt needs cues for hand placement on RW. Pt with multiple steps in his home and at risk for falls so HHOT could help address best in the home.  Patient will benefit from skilled OT acutely to increase independence and safety with ADLS to allow discharge HHOT.        If plan is discharge home, recommend the following: A little help with walking and/or transfers;A little help with bathing/dressing/bathroom    Functional Status Assessment  Patient has had a recent decline in their functional status and demonstrates the ability to make significant improvements in function in a reasonable and predictable amount of time.  Equipment Recommendations  None recommended by OT    Recommendations for Other Services       Precautions / Restrictions Precautions Precautions: Fall;Back Precaution Booklet Issued: Yes (comment) Precaution Comments: reviewed back precautions with adls and handout provided Required Braces or Orthoses:  (No brace needed order) Restrictions Weight Bearing Restrictions: No      Mobility Bed Mobility Overal bed mobility: Needs Assistance Bed Mobility: Rolling, Sidelying to Sit Rolling: Supervision Sidelying to sit: Supervision       General bed mobility comments: pt with heavy use of rail and twisting trunk to reach for controls. pt cues to avoid this motion. pt needed increased time to balance at the eob and cues to scoot and not lift his body with his arms     Transfers Overall transfer level: Needs assistance Equipment used: Rolling walker (2 wheels) Transfers: Sit to/from Stand Sit to Stand: Contact guard assist           General transfer comment: pt attempting to place bil UE on the bottom of the RW and given cues for hand placement. pt educated on the reason for hand placement      Balance Overall balance assessment: Needs assistance Sitting-balance support: Feet supported, No upper extremity supported Sitting balance-Leahy Scale: Fair     Standing balance support: No upper extremity supported, During functional activity, Reliant on assistive device for balance Standing balance-Leahy Scale: Poor Standing balance comment: Needs at least 1 UE support for standing balance.                           ADL either performed or assessed with clinical judgement   ADL Overall ADL's : Needs assistance/impaired Eating/Feeding: Independent Eating/Feeding Details (indicate cue type and reason): eating oatmeal at the end of session Grooming: Wash/dry face;Contact guard assist Grooming Details (indicate cue type and reason): cues for posture and avoid flexion at the sink due to back precautions         Upper Body Dressing : Supervision/safety   Lower Body Dressing: Contact guard assist;Cueing for back precautions   Toilet Transfer: Minimal assistance;Rolling walker (2 wheels)     Toileting - Clothing Manipulation Details (indicate cue type and reason): denies any concerns and generalized information provided to the patient about toilet aids being availabe if he were to decide he needs more help  Functional mobility during ADLs: Contact guard assist;Rolling walker (2 wheels) General ADL Comments: pt completed dressing at he eob, sink level grooming and then chair positioning for breakfast. pt requires cues during the session for back precautions as he when presented with functional task poor return demo     Vision  Baseline Vision/History: 1 Wears glasses Ability to See in Adequate Light: 0 Adequate Patient Visual Report: No change from baseline Vision Assessment?: No apparent visual deficits     Perception         Praxis         Pertinent Vitals/Pain Pain Assessment Pain Assessment: Faces Faces Pain Scale: Hurts even more Pain Location: Incision site Pain Descriptors / Indicators: Operative site guarding, Sore Pain Intervention(s): Limited activity within patient's tolerance, Premedicated before session, Monitored during session, Repositioned     Extremity/Trunk Assessment Upper Extremity Assessment Upper Extremity Assessment: Overall WFL for tasks assessed   Lower Extremity Assessment Lower Extremity Assessment: Defer to PT evaluation   Cervical / Trunk Assessment Cervical / Trunk Assessment: Back Surgery   Communication Communication Communication: No apparent difficulties Cueing Techniques: Verbal cues;Gestural cues   Cognition Arousal: Alert Behavior During Therapy: WFL for tasks assessed/performed Overall Cognitive Status: Within Functional Limits for tasks assessed                                       General Comments  RA with continuous cues for precautions    Exercises     Shoulder Instructions      Home Living Family/patient expects to be discharged to:: Private residence Living Arrangements: Spouse/significant other Available Help at Discharge: Family;Available 24 hours/day Type of Home: House Home Access: Stairs to enter [employer] of Steps: 2 Entrance Stairs-Rails: None Home Layout: Two level;Bed/bath upstairs Alternate Level Stairs-Number of Steps: 18 Alternate Level Stairs-Rails: Can reach both;Right;Left Bathroom Shower/Tub: Producer, television/film/video: Standard     Home Equipment: Agricultural consultant (2 wheels);Adaptive equipment;Shower seat Adaptive Equipment: Reacher Additional Comments: has 2 dogs that they walk  on a leash. reports wife can manage the dogs at this time      Prior Functioning/Environment Prior Level of Function : Independent/Modified Independent                        OT Problem List: Decreased activity tolerance;Impaired balance (sitting and/or standing);Decreased cognition;Decreased safety awareness;Decreased knowledge of use of DME or AE;Decreased knowledge of precautions;Pain      OT Treatment/Interventions: Self-care/ADL training;Energy conservation;DME and/or AE instruction;Modalities;Therapeutic activities;Cognitive remediation/compensation;Patient/family education;Balance training    OT Goals(Current goals can be found in the care plan section) Acute Rehab OT Goals Patient Stated Goal: to be able to return to fishing in the mountains and hiking the path to reach the streams OT Goal Formulation: With patient Time For Goal Achievement: 06/18/23 Potential to Achieve Goals: Good  OT Frequency: Min 1X/week    Co-evaluation              AM-PAC OT "6 Clicks" Daily Activity     Outcome Measure Help from another person eating meals?: None Help from another person taking care of personal grooming?: None Help from another person toileting, which includes using toliet, bedpan, or urinal?: A Little Help from another person bathing (including washing, rinsing, drying)?: A Little Help from another person to put on and taking off regular upper body clothing?: None Help  from another person to put on and taking off regular lower body clothing?: A Little 6 Click Score: 21   End of Session Equipment Utilized During Treatment: Gait belt;Rolling walker (2 wheels) Nurse Communication: Mobility status;Precautions  Activity Tolerance: Patient tolerated treatment well Patient left: in chair;with call bell/phone within reach  OT Visit Diagnosis: Unsteadiness on feet (R26.81);Muscle weakness (generalized) (M62.81)                Time: 8413-2440 OT Time Calculation (min): 14  min Charges:  OT General Charges $OT Visit: 1 Visit OT Evaluation $OT Eval Moderate Complexity: 1 Mod   Brynn, OTR/L  Acute Rehabilitation Services Office: [phone] .   Mateo Flow 06/04/2023, 1:05 PM

## 2023-06-05 ENCOUNTER — Encounter (HOSPITAL_COMMUNITY): Payer: Self-pay | Admitting: Neurological Surgery

## 2023-06-05 DIAGNOSIS — Z981 Arthrodesis status: Secondary | ICD-10-CM | POA: Diagnosis not present

## 2023-06-05 DIAGNOSIS — R110 Nausea: Secondary | ICD-10-CM | POA: Diagnosis not present

## 2023-06-05 DIAGNOSIS — Y92230 Patient room in hospital as the place of occurrence of the external cause: Secondary | ICD-10-CM | POA: Diagnosis not present

## 2023-06-05 DIAGNOSIS — G4733 Obstructive sleep apnea (adult) (pediatric): Secondary | ICD-10-CM | POA: Diagnosis present

## 2023-06-05 DIAGNOSIS — E860 Dehydration: Secondary | ICD-10-CM | POA: Diagnosis not present

## 2023-06-05 DIAGNOSIS — I10 Essential (primary) hypertension: Secondary | ICD-10-CM | POA: Diagnosis present

## 2023-06-05 DIAGNOSIS — M48061 Spinal stenosis, lumbar region without neurogenic claudication: Secondary | ICD-10-CM | POA: Diagnosis present

## 2023-06-05 DIAGNOSIS — Z8249 Family history of ischemic heart disease and other diseases of the circulatory system: Secondary | ICD-10-CM | POA: Diagnosis not present

## 2023-06-05 DIAGNOSIS — W06XXXA Fall from bed, initial encounter: Secondary | ICD-10-CM | POA: Diagnosis not present

## 2023-06-05 DIAGNOSIS — E119 Type 2 diabetes mellitus without complications: Secondary | ICD-10-CM | POA: Diagnosis present

## 2023-06-05 DIAGNOSIS — M5416 Radiculopathy, lumbar region: Secondary | ICD-10-CM | POA: Diagnosis present

## 2023-06-05 DIAGNOSIS — K219 Gastro-esophageal reflux disease without esophagitis: Secondary | ICD-10-CM | POA: Diagnosis present

## 2023-06-05 DIAGNOSIS — M7138 Other bursal cyst, other site: Secondary | ICD-10-CM | POA: Diagnosis present

## 2023-06-05 LAB — GLUCOSE, CAPILLARY
Glucose-Capillary: 133 mg/dL — ABNORMAL HIGH (ref 70–99)
Glucose-Capillary: 146 mg/dL — ABNORMAL HIGH (ref 70–99)
Glucose-Capillary: 124 mg/dL — ABNORMAL HIGH (ref 70–99)
Glucose-Capillary: 133 mg/dL — ABNORMAL HIGH (ref 70–99)

## 2023-06-05 LAB — COMPREHENSIVE METABOLIC PANEL
ALT: 22 U/L (ref 0–44)
AST: 54 U/L — ABNORMAL HIGH (ref 15–41)
Albumin: 3.4 g/dL — ABNORMAL LOW (ref 3.5–5.0)
Alkaline Phosphatase: 42 U/L (ref 38–126)
Anion gap: 12 (ref 5–15)
BUN: 25 mg/dL — ABNORMAL HIGH (ref 8–23)
CO2: 27 mmol/L (ref 22–32)
Calcium: 9.2 mg/dL (ref 8.9–10.3)
Chloride: 95 mmol/L — ABNORMAL LOW (ref 98–111)
Creatinine, Ser: 1.82 mg/dL — ABNORMAL HIGH (ref 0.61–1.24)
GFR, Estimated: 39 mL/min — ABNORMAL LOW (ref >60)
Glucose, Bld: 121 mg/dL — ABNORMAL HIGH (ref 70–99)
Potassium: 3.6 mmol/L (ref 3.5–5.1)
Sodium: 134 mmol/L — ABNORMAL LOW (ref 135–145)
Total Bilirubin: 1.2 mg/dL (ref 0.3–1.2)
Total Protein: 6.7 g/dL (ref 6.5–8.1)

## 2023-06-05 LAB — URINALYSIS, ROUTINE W REFLEX MICROSCOPIC
Bacteria, UA: NONE SEEN
Bilirubin Urine: NEGATIVE
Glucose, UA: NEGATIVE mg/dL
Ketones, ur: 5 mg/dL — AB
Leukocytes,Ua: NEGATIVE
Nitrite: NEGATIVE
Protein, ur: NEGATIVE mg/dL
Specific Gravity, Urine: 1.011 (ref 1.005–1.030)
pH: 5 (ref 5.0–8.0)

## 2023-06-05 LAB — CBC
HCT: 38.1 % — ABNORMAL LOW (ref 39.0–52.0)
Hemoglobin: 13.3 g/dL (ref 13.0–17.0)
MCH: 30.2 pg (ref 26.0–34.0)
MCHC: 34.9 g/dL (ref 30.0–36.0)
MCV: 86.6 fL (ref 80.0–100.0)
Platelets: 170 10*3/uL (ref 150–400)
RBC: 4.4 MIL/uL (ref 4.22–5.81)
RDW: 12.2 % (ref 11.5–15.5)
WBC: 8.8 10*3/uL (ref 4.0–10.5)
nRBC: 0 % (ref 0.0–0.2)

## 2023-06-05 MED ORDER — SODIUM CHLORIDE 0.9 % IV BOLUS
500.0000 mL | Freq: Once | INTRAVENOUS | Status: AC
  Administered 2023-06-05: 500 mL via INTRAVENOUS

## 2023-06-05 NOTE — Progress Notes (Signed)
Physical Therapy Treatment Patient Details Name: Justin Herrera MRN: [id number] DOB: 1951/07/21 Today's Date: 06/05/2023   History of Present Illness Pt is a 72 y/o male who presents s/p L3-L4 TLIF on 06/03/2023. PMH significant for DM, HTN, TLIF in 2023.    PT Comments  Pt progressing with post-op mobility, however requiring increased assistance this date. He was able to demonstrate transfers and ambulation with gross min-mod assist and RW for support. Reinforced education during functional mobility however pt not following commands as consistently today as he was yesterday. Will continue to follow.    Orthostatic BPs  Supine 170/79  Sitting 107/80  Standing 151/109  Standing after ambulation in hall 185/94       If plan is discharge home, recommend the following: A little help with walking and/or transfers;A little help with bathing/dressing/bathroom;Assistance with cooking/housework;Assist for transportation;Help with stairs or ramp for entrance   Can travel by private vehicle        Equipment Recommendations  None recommended by PT    Recommendations for Other Services       Precautions / Restrictions Precautions Precautions: Fall;Back Precaution Booklet Issued: Yes (comment) Precaution Comments: Verbally reviewed precautions during functional mobility. Required Braces or Orthoses:  (No brace needed order) Restrictions Weight Bearing Restrictions: No     Mobility  Bed Mobility Overal bed mobility: Needs Assistance Bed Mobility: Rolling, Sidelying to Sit, Sit to Sidelying Rolling: Mod assist Sidelying to sit: Mod assist, Max assist     Sit to sidelying: Min assist General bed mobility comments: Heavier assist this date for log roll. Pt rolling onto R side and then "letting go", rolling back into supine. Required mod assist for full roll and heavy mod/light max assist for trunk elevation to full sitting position. Increased time to gain/maintain sitting balance.  Pt with difficulty holding upright posture, and falling posteriolaterally to the R several times before being able to maintain static sitting balance for BP reading.    Transfers Overall transfer level: Needs assistance Equipment used: Rolling walker (2 wheels) Transfers: Sit to/from Stand Sit to Stand: Min assist           General transfer comment: VC's for hand placement on seated surface for safety. Assist for power up to full standing.    Ambulation/Gait Ambulation/Gait assistance: Min assist, Mod assist Gait Distance (Feet): 200 Feet Assistive device: Rolling walker (2 wheels) Gait Pattern/deviations: Step-through pattern, Decreased stride length, Trunk flexed, Drifts right/left Gait velocity: Decreased overall but pt rushing steps and unsafe. Gait velocity interpretation: 1.31 - 2.62 ft/sec, indicative of limited community ambulator   General Gait Details: Pt required min assist for balance and mod assist for walker management during gait training this date. Almost constant multimodal cueing was provided for improved posture, closer walker proximity and forward gaze.   Stairs             Wheelchair Mobility     Tilt Bed    Modified Rankin (Stroke Patients Only)       Balance Overall balance assessment: Needs assistance Sitting-balance support: Feet supported, No upper extremity supported Sitting balance-Leahy Scale: Fair     Standing balance support: During functional activity, Reliant on assistive device for balance, Bilateral upper extremity supported Standing balance-Leahy Scale: Poor                              Cognition Arousal: Alert Behavior During Therapy: WFL for tasks assessed/performed Overall Cognitive Status:  Impaired/Different from baseline Area of Impairment: Attention, Memory, Following commands, Safety/judgement, Awareness, Problem solving                   Current Attention Level: Sustained Memory: Decreased  recall of precautions, Decreased short-term memory Following Commands: Follows one step commands inconsistently Safety/Judgement: Decreased awareness of safety, Decreased awareness of deficits Awareness: Intellectual Problem Solving: Slow processing, Decreased initiation, Requires verbal cues General Comments: Pt appears confused at times however oriented. Not able to sustain attention long enough to keep arm still during BP readings without continuous tactile cue of holding hand to keep arm from moving. Pt required almost constant cueing in hallway to maintain precautions and for general safety.        Exercises      General Comments General comments (skin integrity, edema, etc.): SBP sitting 178/87 then standing without any delayed time 139/87. OT requesting PT do true orthostatics during session to capture changes. pt denies any dizziness and only verbalization is the nausae. No pain medications given at the time of treatment.      Pertinent Vitals/Pain Pain Assessment Pain Assessment: Faces Faces Pain Scale: Hurts little more Pain Location: Incision site Pain Descriptors / Indicators: Operative site guarding, Sore Pain Intervention(s): Limited activity within patient's tolerance, Monitored during session, Repositioned    Home Living                          Prior Function            PT Goals (current goals can now be found in the care plan section) Acute Rehab PT Goals Patient Stated Goal: Home today PT Goal Formulation: With patient Time For Goal Achievement: 06/11/23 Potential to Achieve Goals: Good Progress towards PT goals: Progressing toward goals    Frequency    Min 5X/week      PT Plan      Co-evaluation              AM-PAC PT "6 Clicks" Mobility   Outcome Measure  Help needed turning from your back to your side while in a flat bed without using bedrails?: A Lot Help needed moving from lying on your back to sitting on the side of a  flat bed without using bedrails?: A Lot Help needed moving to and from a bed to a chair (including a wheelchair)?: A Little Help needed standing up from a chair using your arms (e.g., wheelchair or bedside chair)?: A Little Help needed to walk in hospital room?: A Lot Help needed climbing 3-5 steps with a railing? : A Lot 6 Click Score: 14    End of Session Equipment Utilized During Treatment: Gait belt Activity Tolerance: Patient tolerated treatment well Patient left: in bed;with call bell/phone within reach (Bed in chair position) Nurse Communication: Mobility status PT Visit Diagnosis: Unsteadiness on feet (R26.81);Pain     Time: 0920-0943 PT Time Calculation (min) (ACUTE ONLY): 23 min  Charges:    $Gait Training: 23-37 mins PT General Charges $$ ACUTE PT VISIT: 1 Visit                     Conni Slipper, PT, DPT Acute Rehabilitation Services Secure Chat Preferred Office: [phone]    Marylynn Pearson 06/05/2023, 10:52 AM

## 2023-06-05 NOTE — Progress Notes (Signed)
Neurosurgery Service Progress Note  Subjective: No acute events overnight, discharge cancelled yesterday due to some persistent nausea, had a small fall last night but feeling well this morning, he's Ox3 and seemingly at baseline but on discussion he doesn't seem as sharp as I'm used to from seeing him in the past   Objective: Vitals:   06/04/23 2322 06/05/23 0110 06/05/23 0434 06/05/23 0759  BP:   118/81 (!) 144/80  Pulse: 77 96 85 90  Resp:  (!) 22 20 18   Temp:  99.4 F (37.4 C) 100 F (37.8 C) 100 F (37.8 C)  TempSrc:  Oral Oral Oral  SpO2: 92% 91% 93% 92%  Weight:      Height:        Physical Exam: Strength 5/5 x4 and SILTx4, incisions c/d/i  Assessment & Plan: 72 y.o. man s/p resection of synovial cyst and extension of fusion, post op course c/b prolonged PONV.  -will check labs to make sure there's not an alternative, suspect he's a little dry from the lack of intake. If he is then, despite the IVF shortage, this will warrant IVF as he is nauseated and can't take oral hydration solution  Jadene Pierini  06/05/23 8:43 AM

## 2023-06-05 NOTE — Progress Notes (Signed)
Occupational Therapy Treatment Patient Details Name: Justin Herrera MRN: [id number] DOB: 1950-12-12 Today's Date: 06/05/2023   History of present illness Pt is a 72 y/o male who presents s/p L3-L4 TLIF on 06/03/2023. PMH significant for DM, HTN, TLIF in 2023.   OT comments  Pt demonstrates impulsive high fall risk behavior during session with poor return demo of RW use. The patient noted to have SBP drop with symptoms of nausea. Pt denies any dizziness and does not self report until the act of trying to vomit showing decreased awareness to onset. Pt slow to process information this session and no pain medications provided prior to session. Recommendations updated to include community based rehab and concerns for high fall risk. Pt has full flight of stairs into the house and stairs to reach bedrooms/ bathrooms in his home. Pt will have to demonstrate activity tolerance for an adls without vomiting or reduced BP to safely dc home.       If plan is discharge home, recommend the following:  A little help with walking and/or transfers;A little help with bathing/dressing/bathroom   Equipment Recommendations  None recommended by OT    Recommendations for Other Services      Precautions / Restrictions Precautions Precautions: Fall;Back Precaution Booklet Issued: Yes (comment) Precaution Comments: reviewed back precautions with adls       Mobility Bed Mobility Overal bed mobility: Needs Assistance Bed Mobility: Rolling, Supine to Sit, Sit to Supine Rolling: Contact guard assist Sidelying to sit: Contact guard assist   Sit to supine: Mod assist   General bed mobility comments: pt exiting the bed on the left side with mod cues to sequence task and not break precautions. pt able to push off R UE to sitting. pt with return to supine required blocking of UB to prevent twisting and total (A) to bring bil LE onto bed surface. pt in side lying then rolling onot back.    Transfers Overall  transfer level: Needs assistance Equipment used: Rolling walker (2 wheels) Transfers: Sit to/from Stand Sit to Stand: Mod assist           General transfer comment: pt placing hands on the bottom of the RW then cued for correct placement. pt on first attempt able to stand with CGA. pt with each attempt during session needing increased help to elevate from surface and every tiem cues for hand placement. pt benefits from elevated surfaces.     Balance Overall balance assessment: Needs assistance Sitting-balance support: Bilateral upper extremity supported, Feet supported Sitting balance-Leahy Scale: Fair     Standing balance support: Bilateral upper extremity supported, During functional activity, Reliant on assistive device for balance Standing balance-Leahy Scale: Poor                             ADL either performed or assessed with clinical judgement   ADL Overall ADL's : Needs assistance/impaired   Eating/Feeding Details (indicate cue type and reason): oatmeal at bedside but not eaten or started. pt states "i am was about to eat"   Grooming Details (indicate cue type and reason): went to sink surface to do this task and pt started attempting toilet transfer. pt reporting need to vomit so was unable to initiate hand hygiene as pt exiting bathroom to bedside                 Toilet Transfer: Minimal assistance;Regular Toilet;Grab bars;Rolling walker (2 wheels)  Functional mobility during ADLs: Moderate assistance;Rolling walker (2 wheels) General ADL Comments: pt attempting to abandon RW multiple times during session. pt running the RW into the wall on the R side during session. pt  forward flexed and bend over in rw. pt noted to have nausea that is sudden and abrupt. BP taken in sitting at eob and then in standing. noted to have SBP drop. RN present to witness    Extremity/Trunk Assessment Upper Extremity Assessment Upper Extremity Assessment:  Overall WFL for tasks assessed   Lower Extremity Assessment Lower Extremity Assessment: Defer to PT evaluation        Vision       Perception     Praxis      Cognition Arousal: Alert Behavior During Therapy: Impulsive Overall Cognitive Status: Impaired/Different from baseline Area of Impairment: Safety/judgement, Awareness                         Safety/Judgement: Decreased awareness of safety, Decreased awareness of deficits Awareness: Emergent   General Comments: pt needs repetitive cues for back precautions adn RW safety. pt demonstrates high fall risk behavior throughout session.        Exercises      Shoulder Instructions       General Comments SBP sitting 178/87 then standing without any delayed time 139/87. OT requesting PT do true orthostatics during session to capture changes. pt denies any dizziness and only verbalization is the nausae. No pain medications given at the time of treatment.    Pertinent Vitals/ Pain       Pain Assessment Pain Assessment: Faces Faces Pain Scale: Hurts even more Pain Location: Incision site Pain Descriptors / Indicators: Operative site guarding, Sore Pain Intervention(s): Repositioned, Monitored during session  Home Living                                          Prior Functioning/Environment              Frequency  Min 1X/week        Progress Toward Goals  OT Goals(current goals can now be found in the care plan section)  Progress towards OT goals: Not progressing toward goals - comment  Acute Rehab OT Goals Patient Stated Goal: none stated by patient. OT Goal Formulation: With patient Time For Goal Achievement: 06/18/23 Potential to Achieve Goals: Good ADL Goals Pt Will Transfer to Toilet: with supervision;ambulating;regular height toilet Additional ADL Goal #1: pt will complete sink adl with 100% back precautions Additional ADL Goal #2: pt will maintain RW in safet  positioning during adls 100% of session  Plan      Co-evaluation                 AM-PAC OT "6 Clicks" Daily Activity     Outcome Measure   Help from another person eating meals?: None Help from another person taking care of personal grooming?: None Help from another person toileting, which includes using toliet, bedpan, or urinal?: A Little Help from another person bathing (including washing, rinsing, drying)?: A Little Help from another person to put on and taking off regular upper body clothing?: None Help from another person to put on and taking off regular lower body clothing?: A Little 6 Click Score: 21    End of Session Equipment Utilized During Treatment: Gait belt;Rolling walker (2 wheels)  OT Visit Diagnosis: Unsteadiness on feet (R26.81);Muscle weakness (generalized) (M62.81)   Activity Tolerance Treatment limited secondary to medical complications (Comment) (nausea/ SBP drop)   Patient Left in bed;with call bell/phone within reach;Other (comment) (IV team arriving to bed side so alarm not set at this time)   Nurse Communication Mobility status;Precautions        Time: 4696-2952 OT Time Calculation (min): 15 min  Charges: OT General Charges $OT Visit: 1 Visit OT Treatments $Self Care/Home Management : 8-22 mins   Justin Herrera, OTR/L  Acute Rehabilitation Services Office: [phone] .   Mateo Flow 06/05/2023, 10:35 AM

## 2023-06-06 LAB — GLUCOSE, CAPILLARY: Glucose-Capillary: 106 mg/dL — ABNORMAL HIGH (ref 70–99)

## 2023-06-06 MED ORDER — BACLOFEN 10 MG PO TABS: 10.0000 mg | ORAL_TABLET | Freq: Three times a day (TID) | ORAL | 1 refills | Status: DC

## 2023-06-06 MED ORDER — ONDANSETRON 4 MG PO TBDP: 4.0000 mg | ORAL_TABLET | Freq: Three times a day (TID) | ORAL | 0 refills | Status: DC | PRN

## 2023-06-06 MED ORDER — HYDROCODONE-ACETAMINOPHEN 5-325 MG PO TABS: 1.0000 | ORAL_TABLET | ORAL | 0 refills | Status: DC | PRN

## 2023-06-06 NOTE — Discharge Summary (Signed)
Discharge Summary  Date of Admission: 06/03/2023  Date of Discharge: 06/06/23  Attending Physician: Autumn Patty, MD  Hospital Course: Patient was admitted following an uncomplicated resection of synovial cyst and extension of fusion. They were recovered in PACU and transferred to [facility]. Their preop symptoms were improved, their hospital course was prolonged d/t PONV and subsequently dehydration. This resolved with time and IVF. The patient was discharged home today. They will follow up in clinic with me in clinic in 2 weeks.  Neurologic exam at discharge:  Strength 5/5 x4 and SILTx4, incision c/d/I   Discharge diagnosis: Synovial cyst, severe lumbar stenosis, adjacent segment disease, lumbar radiculopathy   Iran Sizer, PA-C 06/06/23 9:29 AM

## 2023-06-06 NOTE — Progress Notes (Signed)
Patient alert and oriented, void, ambulate. Surgical site clean and dry no sign of infection. D/c instructions explain and given. Pt. D/c home per order

## 2023-06-06 NOTE — Progress Notes (Signed)
Neurosurgery Service Progress Note  Subjective: No acute events overnight. He is feeling significantly better today with resolution of his PONV. He is Ox3 and is back to his baseline regarding his mental status and cognition.     Objective: Vitals:   06/05/23 2320 06/06/23 0352 06/06/23 0558 06/06/23 0730  BP: 120/75 131/76  118/64  Pulse: 74 77  69  Resp: 18 18  16   Temp: 98.1 F (36.7 C) (!) 100.7 F (38.2 C) 97.9 F (36.6 C) 98 F (36.7 C)  TempSrc: Oral Oral Oral Oral  SpO2: 94% 94%  93%  Weight:      Height:        Physical Exam: Strength 5/5 x4 and SILTx4, incision c/d/I   Assessment & Plan: 72 y.o. man s/p resection of synovial cyst and extension of fusion, post op course c/b prolonged PONV and subsequently dehydration, improved with time and IV fluids.   -discharge home today   Emilee Hero, PA-C 06/06/23 9:26 AM

## 2023-06-06 NOTE — Progress Notes (Signed)
Physical Therapy Treatment  Patient Details Name: Justin Herrera MRN: [id number] DOB: [birth year] Today's Date: 06/06/2023   History of Present Illness Pt is a 72 y/o male who presents s/p L3-L4 TLIF on 06/03/2023. PMH significant for DM, HTN, TLIF in 2023.    PT Comments  Pt progressing well with post-op mobility. He was able to demonstrate transfers and ambulation with gross CGA to min assist and RW for support. Pt was educated on precautions, positioning recommendations, appropriate activity progression, and car transfer. Will continue to follow.      If plan is discharge home, recommend the following: A little help with walking and/or transfers;A little help with bathing/dressing/bathroom;Assistance with cooking/housework;Assist for transportation;Help with stairs or ramp for entrance   Can travel by private vehicle        Equipment Recommendations  None recommended by PT    Recommendations for Other Services       Precautions / Restrictions Precautions Precautions: Fall;Back Precaution Booklet Issued: Yes (comment) Precaution Comments: Verbally reviewed precautions during functional mobility. Precaution handout in room. Required Braces or Orthoses:  (No brace needed order) Restrictions Weight Bearing Restrictions: No     Mobility  Bed Mobility               General bed mobility comments: Pt was received sitting up in the recliner.    Transfers Overall transfer level: Needs assistance Equipment used: Rolling walker (2 wheels) Transfers: Sit to/from Stand Sit to Stand: Contact guard assist           General transfer comment: VC's for hand placement on seated surface for safety.    Ambulation/Gait Ambulation/Gait assistance: Min assist, Contact guard assist Gait Distance (Feet): 300 Feet Assistive device: Rolling walker (2 wheels) Gait Pattern/deviations: Step-through pattern, Decreased stride length, Trunk flexed, Drifts right/left Gait velocity:  Decreased Gait velocity interpretation: 1.31 - 2.62 ft/sec, indicative of limited community ambulator   General Gait Details: Occasional min assist for walker management, but overall at a CGA level. Frequent cues for improved posture, closer walker proximity and forward gaze.   Stairs Stairs: Yes Stairs assistance: Contact guard assist Stair Management: Two rails, Step to pattern, Forwards Number of Stairs: 10 General stair comments: VC's for sequencing and general safety.   Wheelchair Mobility     Tilt Bed    Modified Rankin (Stroke Patients Only)       Balance Overall balance assessment: Needs assistance Sitting-balance support: Feet supported, No upper extremity supported Sitting balance-Leahy Scale: Fair     Standing balance support: Bilateral upper extremity supported, During functional activity, Reliant on assistive device for balance Standing balance-Leahy Scale: Poor                              Cognition Arousal: Alert Behavior During Therapy: WFL for tasks assessed/performed Overall Cognitive Status: Within Functional Limits for tasks assessed                                          Exercises      General Comments General comments (skin integrity, edema, etc.): reviewed back precautions with patient recalling 1/3 initially and able to recall 3/3 at end of sesson following review      Pertinent Vitals/Pain Pain Assessment Pain Assessment: Faces Faces Pain Scale: Hurts a little bit Pain Location: Incision site Pain Descriptors / Indicators:  Operative site guarding, Sore Pain Intervention(s): Limited activity within patient's tolerance, Monitored during session, Repositioned    Home Living                          Prior Function            PT Goals (current goals can now be found in the care plan section) Acute Rehab PT Goals Patient Stated Goal: Home today PT Goal Formulation: With patient Time For Goal  Achievement: 06/11/23 Potential to Achieve Goals: Good Progress towards PT goals: Progressing toward goals    Frequency    Min 5X/week      PT Plan      Co-evaluation              AM-PAC PT "6 Clicks" Mobility   Outcome Measure  Help needed turning from your back to your side while in a flat bed without using bedrails?: A Little Help needed moving from lying on your back to sitting on the side of a flat bed without using bedrails?: A Little Help needed moving to and from a bed to a chair (including a wheelchair)?: A Little Help needed standing up from a chair using your arms (e.g., wheelchair or bedside chair)?: A Little Help needed to walk in hospital room?: A Little Help needed climbing 3-5 steps with a railing? : A Little 6 Click Score: 18    End of Session Equipment Utilized During Treatment: Gait belt Activity Tolerance: Patient tolerated treatment well Patient left: in chair;with call bell/phone within reach;with family/visitor present Nurse Communication: Mobility status PT Visit Diagnosis: Unsteadiness on feet (R26.81);Pain Pain - part of body:  (back)     Time: 1610-9604 PT Time Calculation (min) (ACUTE ONLY): 11 min  Charges:    $Gait Training: 8-22 mins PT General Charges $$ ACUTE PT VISIT: 1 Visit                     Conni Slipper, PT, DPT Acute Rehabilitation Services Secure Chat Preferred Office: [phone]    Marylynn Pearson 06/06/2023, 9:50 AM

## 2023-06-06 NOTE — Progress Notes (Signed)
Occupational Therapy Treatment Patient Details Name: Justin Herrera MRN: [id number] DOB: 1951/01/11 Today's Date: 06/06/2023   History of present illness Pt is a 72 y/o male who presents s/p L3-L4 TLIF on 06/03/2023. PMH significant for DM, HTN, TLIF in 2023.   OT comments  Patient received in supine, alert, and eager to get OOB. Patient demonstrating good understanding of log rollling with min verbal cues and supervision to roll and side lying to sitting on EOB. Patient required verbal cues for hand placement to stand and for safety with walker use to ambulate to bathroom for self care tasks. Patient performed dressing seated on EOB with cues for back precautions and CGA while standing for safety. Wife present during session and plan is to discharge home today with HHOT to follow to continue to address toilet transfers and self care.       If plan is discharge home, recommend the following:  A little help with walking and/or transfers;A little help with bathing/dressing/bathroom   Equipment Recommendations  None recommended by OT    Recommendations for Other Services      Precautions / Restrictions Precautions Precautions: Fall;Back Precaution Booklet Issued: Yes (comment) Precaution Comments: Verbally reviewed precautions Restrictions Weight Bearing Restrictions: No       Mobility Bed Mobility Overal bed mobility: Needs Assistance Bed Mobility: Rolling, Sidelying to Sit Rolling: Supervision Sidelying to sit: Contact guard assist, Used rails       General bed mobility comments: verbal cues for log rolling with good follow through, bed rails used to raise trunk    Transfers Overall transfer level: Needs assistance Equipment used: Rolling walker (2 wheels) Transfers: Sit to/from Stand Sit to Stand: Supervision, Contact guard assist           General transfer comment: verbal cues for hand placement to stand and sit, cues on pace during mobility     Balance  Overall balance assessment: Needs assistance Sitting-balance support: Feet supported, No upper extremity supported Sitting balance-Leahy Scale: Fair Sitting balance - Comments: able to perform dressing seated on EOB   Standing balance support: Single extremity supported, Bilateral upper extremity supported, During functional activity Standing balance-Leahy Scale: Poor Standing balance comment: single extremity support while standing at sink for grooming and bathing tasks.                           ADL either performed or assessed with clinical judgement   ADL Overall ADL's : Needs assistance/impaired     Grooming: Wash/dry hands;Wash/dry face;Contact guard assist;Standing Grooming Details (indicate cue type and reason): stood at sink for grooming with CGA for safety and sink for support Upper Body Bathing: Contact guard assist;Standing Upper Body Bathing Details (indicate cue type and reason): at sink Lower Body Bathing: Contact guard assist;Sit to/from stand Lower Body Bathing Details (indicate cue type and reason): peri area bathing while standing Upper Body Dressing : Supervision/safety;Sitting   Lower Body Dressing: Contact guard assist;Cueing for back precautions Lower Body Dressing Details (indicate cue type and reason): figure four position to maintain back precautions Toilet Transfer: Contact guard assist;Regular Toilet             General ADL Comments: frequent cues for safety and back precautions    Extremity/Trunk Assessment              Vision       Perception     Praxis      Cognition Arousal: Alert Behavior During  Therapy: Impulsive Overall Cognitive Status: Impaired/Different from baseline Area of Impairment: Attention, Memory, Following commands, Safety/judgement, Awareness, Problem solving                   Current Attention Level: Sustained Memory: Decreased recall of precautions, Decreased short-term memory Following  Commands: Follows one step commands inconsistently Safety/Judgement: Decreased awareness of safety, Decreased awareness of deficits Awareness: Intellectual Problem Solving: Slow processing, Decreased initiation, Requires verbal cues General Comments: frequent cue for safety and back precautions        Exercises      Shoulder Instructions       General Comments reviewed back precautions with patient recalling 1/3 initially and able to recall 3/3 at end of sesson following review    Pertinent Vitals/ Pain       Pain Assessment Pain Assessment: 0-10 Pain Score: 4  Pain Location: Incision site Pain Descriptors / Indicators: Operative site guarding, Sore Pain Intervention(s): Monitored during session, Premedicated before session, Repositioned  Home Living                                          Prior Functioning/Environment              Frequency  Min 1X/week        Progress Toward Goals  OT Goals(current goals can now be found in the care plan section)  Progress towards OT goals: Progressing toward goals  Acute Rehab OT Goals Patient Stated Goal: go home OT Goal Formulation: With patient Time For Goal Achievement: 06/18/23 Potential to Achieve Goals: Good ADL Goals Pt Will Transfer to Toilet: with supervision;ambulating;regular height toilet Additional ADL Goal #1: pt will complete sink adl with 100% back precautions Additional ADL Goal #2: pt will maintain RW in safet positioning during adls 100% of session  Plan      Co-evaluation                 AM-PAC OT "6 Clicks" Daily Activity     Outcome Measure   Help from another person eating meals?: None Help from another person taking care of personal grooming?: None Help from another person toileting, which includes using toliet, bedpan, or urinal?: A Little Help from another person bathing (including washing, rinsing, drying)?: A Little Help from another person to put on and  taking off regular upper body clothing?: None Help from another person to put on and taking off regular lower body clothing?: A Little 6 Click Score: 21    End of Session Equipment Utilized During Treatment: Gait belt;Rolling walker (2 wheels)  OT Visit Diagnosis: Unsteadiness on feet (R26.81);Muscle weakness (generalized) (M62.81)   Activity Tolerance Patient tolerated treatment well   Patient Left in chair;with call bell/phone within reach;with family/visitor present   Nurse Communication Mobility status;Precautions        Time: 7829-5621 OT Time Calculation (min): 20 min  Charges: OT General Charges $OT Visit: 1 Visit OT Treatments $Self Care/Home Management : 8-22 mins  Alfonse Flavors, OTA Acute Rehabilitation Services  Office [phone]   Dewain Penning 06/06/2023, 8:20 AM

## 2023-07-04 ENCOUNTER — Other Ambulatory Visit: Payer: Self-pay | Admitting: Family Medicine

## 2023-08-05 ENCOUNTER — Other Ambulatory Visit: Payer: Self-pay | Admitting: Family Medicine

## 2023-09-02 ENCOUNTER — Other Ambulatory Visit: Payer: Self-pay | Admitting: Family Medicine

## 2023-09-06 ENCOUNTER — Ambulatory Visit: Payer: Medicare PPO | Admitting: Family Medicine

## 2023-09-12 DIAGNOSIS — E785 Hyperlipidemia, unspecified: Secondary | ICD-10-CM | POA: Diagnosis not present

## 2023-09-12 DIAGNOSIS — M1990 Unspecified osteoarthritis, unspecified site: Secondary | ICD-10-CM | POA: Diagnosis not present

## 2023-09-12 DIAGNOSIS — I129 Hypertensive chronic kidney disease with stage 1 through stage 4 chronic kidney disease, or unspecified chronic kidney disease: Secondary | ICD-10-CM | POA: Diagnosis not present

## 2023-09-12 DIAGNOSIS — N1832 Chronic kidney disease, stage 3b: Secondary | ICD-10-CM | POA: Diagnosis not present

## 2023-09-12 DIAGNOSIS — E1122 Type 2 diabetes mellitus with diabetic chronic kidney disease: Secondary | ICD-10-CM | POA: Diagnosis not present

## 2023-09-13 LAB — LAB REPORT - SCANNED
Albumin, Urine POC: 68.7
Creatinine, POC: 119.8 mg/dL
Microalb Creat Ratio: 57

## 2023-09-16 ENCOUNTER — Encounter: Payer: Self-pay | Admitting: Family Medicine

## 2023-09-16 ENCOUNTER — Ambulatory Visit (INDEPENDENT_AMBULATORY_CARE_PROVIDER_SITE_OTHER): Payer: Medicare PPO | Admitting: Family Medicine

## 2023-09-16 VITALS — BP 128/86 | HR 58 | Temp 97.9°F | Wt 223.0 lb

## 2023-09-16 DIAGNOSIS — G8929 Other chronic pain: Secondary | ICD-10-CM

## 2023-09-16 DIAGNOSIS — M5441 Lumbago with sciatica, right side: Secondary | ICD-10-CM | POA: Diagnosis not present

## 2023-09-16 MED ORDER — TRAMADOL HCL 50 MG PO TABS
100.0000 mg | ORAL_TABLET | Freq: Four times a day (QID) | ORAL | 0 refills | Status: AC | PRN
Start: 2023-09-16 — End: 2023-09-21

## 2023-09-16 NOTE — Progress Notes (Signed)
   Subjective:    Patient ID: Justin Herrera, male    DOB: June 22, 1951, 73 y.o.   MRN: [id number]  HPI Here asking for help with his low back pain. On 07-16-22 he had a fusion at L3-4, and on 06-03-23 he had a fusion at l2-3. Both of these surgeries were done by Dr. Autumn Patty, but he has since left the area. He now sees Dr. Jake Samples, a pain management specialist at [facility]. He has had one epidural steroid injection, and Dr. Jake Samples has ordered PT which will begin soon. Justin Herrera describes daily pain which is sharp and sometimes severe. The pain starts in the lower right back and it radiates down the right lateral and anterior thigh to the knee. He denies any weakness or numbness in the leg. He takes Tylenol as needed, but this does not help much. He cannot take NSAID's due to his CKD. The pain mostly bothers him when he is standing or walking. He has very little pain when sitting or lying down. He was given a Prednisone taper a few months ago, and this helped a great deal until the last few doses of the pack.    Review of Systems  Constitutional: Negative.   Respiratory: Negative.    Cardiovascular: Negative.   Musculoskeletal:  Positive for back pain.       Objective:   Physical Exam Constitutional:      Appearance: Normal appearance.  Cardiovascular:     Rate and Rhythm: Normal rate and regular rhythm.     Pulses: Normal pulses.     Heart sounds: Normal heart sounds.  Pulmonary:     Effort: Pulmonary effort is normal.     Breath sounds: Normal breath sounds.  Musculoskeletal:     Comments: He is not tender in the lower back, but he is very tender in the right sciatic notch. ROM is limited by pain. SLR are negative.   Neurological:     Mental Status: He is alert.           Assessment & Plan:  Low back pain with right sciatica. We will provide him with Tramadol to take in addition to Tylenol as needed. I encouraged him to start the PT as directed.  Gershon Crane,  MD

## 2023-10-02 ENCOUNTER — Other Ambulatory Visit: Payer: Self-pay | Admitting: Family Medicine

## 2023-10-22 DIAGNOSIS — M48062 Spinal stenosis, lumbar region with neurogenic claudication: Secondary | ICD-10-CM | POA: Diagnosis not present

## 2023-10-25 ENCOUNTER — Other Ambulatory Visit: Payer: Self-pay | Admitting: Family Medicine

## 2023-10-30 DIAGNOSIS — M48062 Spinal stenosis, lumbar region with neurogenic claudication: Secondary | ICD-10-CM | POA: Diagnosis not present

## 2023-11-04 ENCOUNTER — Encounter: Payer: Self-pay | Admitting: Family Medicine

## 2023-11-04 ENCOUNTER — Ambulatory Visit (INDEPENDENT_AMBULATORY_CARE_PROVIDER_SITE_OTHER): Payer: Medicare PPO | Admitting: Family Medicine

## 2023-11-04 VITALS — BP 128/80 | HR 55 | Temp 97.9°F | Wt 218.0 lb

## 2023-11-04 DIAGNOSIS — N1831 Chronic kidney disease, stage 3a: Secondary | ICD-10-CM | POA: Diagnosis not present

## 2023-11-04 DIAGNOSIS — K219 Gastro-esophageal reflux disease without esophagitis: Secondary | ICD-10-CM | POA: Diagnosis not present

## 2023-11-04 DIAGNOSIS — G8929 Other chronic pain: Secondary | ICD-10-CM

## 2023-11-04 DIAGNOSIS — F32A Depression, unspecified: Secondary | ICD-10-CM | POA: Diagnosis not present

## 2023-11-04 DIAGNOSIS — E785 Hyperlipidemia, unspecified: Secondary | ICD-10-CM | POA: Diagnosis not present

## 2023-11-04 DIAGNOSIS — E119 Type 2 diabetes mellitus without complications: Secondary | ICD-10-CM

## 2023-11-04 DIAGNOSIS — F5101 Primary insomnia: Secondary | ICD-10-CM | POA: Diagnosis not present

## 2023-11-04 DIAGNOSIS — I10 Essential (primary) hypertension: Secondary | ICD-10-CM

## 2023-11-04 DIAGNOSIS — F419 Anxiety disorder, unspecified: Secondary | ICD-10-CM | POA: Diagnosis not present

## 2023-11-04 DIAGNOSIS — M5441 Lumbago with sciatica, right side: Secondary | ICD-10-CM

## 2023-11-04 DIAGNOSIS — N401 Enlarged prostate with lower urinary tract symptoms: Secondary | ICD-10-CM | POA: Diagnosis not present

## 2023-11-04 DIAGNOSIS — N138 Other obstructive and reflux uropathy: Secondary | ICD-10-CM | POA: Diagnosis not present

## 2023-11-04 MED ORDER — GLIPIZIDE 10 MG PO TABS: 10.0000 mg | ORAL_TABLET | Freq: Two times a day (BID) | ORAL | 3 refills | Status: AC

## 2023-11-04 MED ORDER — ESOMEPRAZOLE MAGNESIUM 40 MG PO CPDR: 40.0000 mg | DELAYED_RELEASE_CAPSULE | Freq: Every day | ORAL | 3 refills | Status: AC

## 2023-11-04 MED ORDER — METOPROLOL SUCCINATE ER 100 MG PO TB24: 100.0000 mg | ORAL_TABLET | Freq: Every day | ORAL | 3 refills | Status: AC

## 2023-11-04 MED ORDER — METFORMIN HCL 500 MG PO TABS: 500.0000 mg | ORAL_TABLET | Freq: Two times a day (BID) | ORAL | 3 refills | Status: AC

## 2023-11-04 MED ORDER — AMLODIPINE-OLMESARTAN 10-40 MG PO TABS: 1.0000 | ORAL_TABLET | Freq: Every day | ORAL | 3 refills | Status: AC

## 2023-11-04 MED ORDER — HYDROCHLOROTHIAZIDE 25 MG PO TABS: 25.0000 mg | ORAL_TABLET | Freq: Every day | ORAL | 3 refills | Status: AC

## 2023-11-04 MED ORDER — SERTRALINE HCL 50 MG PO TABS: ORAL_TABLET | ORAL | 3 refills | Status: AC

## 2023-11-04 MED ORDER — ATORVASTATIN CALCIUM 20 MG PO TABS: 20.0000 mg | ORAL_TABLET | Freq: Every day | ORAL | 3 refills | Status: DC

## 2023-11-04 MED ORDER — TRAMADOL HCL 50 MG PO TABS: 50.0000 mg | ORAL_TABLET | Freq: Four times a day (QID) | ORAL | 0 refills | Status: AC | PRN

## 2023-11-04 NOTE — Progress Notes (Signed)
 Subjective:    Patient ID: Justin Herrera, male    DOB: [birth year], 73 y.o.   MRN: [id number]  HPI Here to follow up on issues. He is doing well in general. His main complaint is his lower back pain. He is seeing [facility] for this. He has received one epidurla steroid injection, and he is attending PT. He uses Tramadol for pain. His BP has been stable. His GERD is stable. He sleeps well with Temazepam. He sees Nephrology for his CKD, which seems to be stable.    Review of Systems  Constitutional: Negative.   HENT: Negative.    Eyes: Negative.   Respiratory: Negative.    Cardiovascular: Negative.   Gastrointestinal: Negative.   Genitourinary: Negative.   Musculoskeletal:  Positive for back pain.  Skin: Negative.   Neurological: Negative.   Psychiatric/Behavioral: Negative.         Objective:   Physical Exam Constitutional:      General: He is not in acute distress.    Appearance: Normal appearance. He is well-developed. He is not diaphoretic.  HENT:     Head: Normocephalic and atraumatic.     Right Ear: External ear normal.     Left Ear: External ear normal.     Nose: Nose normal.     Mouth/Throat:     Pharynx: No oropharyngeal exudate.  Eyes:     General: No scleral icterus.       Right eye: No discharge.        Left eye: No discharge.     Conjunctiva/sclera: Conjunctivae normal.     Pupils: Pupils are equal, round, and reactive to light.  Neck:     Thyroid: No thyromegaly.     Vascular: No JVD.     Trachea: No tracheal deviation.  Cardiovascular:     Rate and Rhythm: Normal rate and regular rhythm.     Pulses: Normal pulses.     Heart sounds: Normal heart sounds. No murmur heard.    No friction rub. No gallop.  Pulmonary:     Effort: Pulmonary effort is normal. No respiratory distress.     Breath sounds: Normal breath sounds. No wheezing or rales.  Chest:     Chest wall: No tenderness.  Abdominal:     General: Bowel sounds are normal. There  is no distension.     Palpations: Abdomen is soft. There is no mass.     Tenderness: There is no abdominal tenderness. There is no guarding or rebound.  Genitourinary:    Penis: Normal. No tenderness.      Testes: Normal.     Prostate: Normal.     Rectum: Normal. Guaiac result negative.  Musculoskeletal:        General: No tenderness. Normal range of motion.     Cervical back: Neck supple.  Lymphadenopathy:     Cervical: No cervical adenopathy.  Skin:    General: Skin is warm and dry.     Coloration: Skin is not pale.     Findings: No erythema or rash.  Neurological:     General: No focal deficit present.     Mental Status: He is alert and oriented to person, place, and time.     Cranial Nerves: No cranial nerve deficit.     Motor: No abnormal muscle tone.     Coordination: Coordination normal.     Deep Tendon Reflexes: Reflexes are normal and symmetric. Reflexes normal.  Psychiatric:  Mood and Affect: Mood normal.        Behavior: Behavior normal.        Thought Content: Thought content normal.        Judgment: Judgment normal.           Assessment & Plan:  He is dealing qith lower back pain, so we will refill the Tramadol. His GERD and insomnia are stable. His CKD is stable. Get fasting labs to check lipids, an A1c for his type 2 diabetes, etc. We spent a total of ( 32  ) minutes reviewing records and discussing these issues.  Gershon Crane, MD

## 2023-11-05 LAB — HEPATIC FUNCTION PANEL
ALT: 13 U/L (ref 0–53)
AST: 12 U/L (ref 0–37)
Albumin: 4.5 g/dL (ref 3.5–5.2)
Alkaline Phosphatase: 61 U/L (ref 39–117)
Bilirubin, Direct: 0.2 mg/dL (ref 0.0–0.3)
Total Bilirubin: 0.7 mg/dL (ref 0.2–1.2)
Total Protein: 7 g/dL (ref 6.0–8.3)

## 2023-11-05 LAB — CBC WITH DIFFERENTIAL/PLATELET
Basophils Absolute: 0.1 10*3/uL (ref 0.0–0.1)
Basophils Relative: 0.7 % (ref 0.0–3.0)
Eosinophils Absolute: 0.2 10*3/uL (ref 0.0–0.7)
Eosinophils Relative: 2.4 % (ref 0.0–5.0)
HCT: 41.6 % (ref 39.0–52.0)
Hemoglobin: 14.5 g/dL (ref 13.0–17.0)
Lymphocytes Relative: 20 % (ref 12.0–46.0)
Lymphs Abs: 1.4 10*3/uL (ref 0.7–4.0)
MCHC: 34.9 g/dL (ref 30.0–36.0)
MCV: 86.2 fl (ref 78.0–100.0)
Monocytes Absolute: 0.6 10*3/uL (ref 0.1–1.0)
Monocytes Relative: 8.8 % (ref 3.0–12.0)
Neutro Abs: 4.6 10*3/uL (ref 1.4–7.7)
Neutrophils Relative %: 68.1 % (ref 43.0–77.0)
Platelets: 227 10*3/uL (ref 150.0–400.0)
RBC: 4.82 Mil/uL (ref 4.22–5.81)
RDW: 13.3 % (ref 11.5–15.5)
WBC: 6.8 10*3/uL (ref 4.0–10.5)

## 2023-11-05 LAB — PSA: PSA: 0.78 ng/mL (ref 0.10–4.00)

## 2023-11-05 LAB — TSH: TSH: 1.2 u[IU]/mL (ref 0.35–5.50)

## 2023-11-05 LAB — BASIC METABOLIC PANEL
BUN: 25 mg/dL — ABNORMAL HIGH (ref 6–23)
CO2: 32 meq/L (ref 19–32)
Calcium: 10.5 mg/dL (ref 8.4–10.5)
Chloride: 98 meq/L (ref 96–112)
Creatinine, Ser: 1.54 mg/dL — ABNORMAL HIGH (ref 0.40–1.50)
GFR: 44.99 mL/min — ABNORMAL LOW (ref >60.00)
Glucose, Bld: 160 mg/dL — ABNORMAL HIGH (ref 70–99)
Potassium: 4.1 meq/L (ref 3.5–5.1)
Sodium: 139 meq/L (ref 135–145)

## 2023-11-05 LAB — LIPID PANEL
Cholesterol: 99 mg/dL (ref 0–200)
HDL: 31.3 mg/dL — ABNORMAL LOW (ref >39.00)
LDL Cholesterol: 30 mg/dL (ref 0–99)
NonHDL: 67.45
Total CHOL/HDL Ratio: 3
Triglycerides: 187 mg/dL — ABNORMAL HIGH (ref 0.0–149.0)
VLDL: 37.4 mg/dL (ref 0.0–40.0)

## 2023-11-05 LAB — HEMOGLOBIN A1C: Hgb A1c MFr Bld: 7.1 % — ABNORMAL HIGH (ref 4.6–6.5)

## 2023-11-13 ENCOUNTER — Telehealth (INDEPENDENT_AMBULATORY_CARE_PROVIDER_SITE_OTHER): Payer: Self-pay | Admitting: Otolaryngology

## 2023-11-13 DIAGNOSIS — M48062 Spinal stenosis, lumbar region with neurogenic claudication: Secondary | ICD-10-CM | POA: Diagnosis not present

## 2023-11-13 NOTE — Telephone Encounter (Signed)
 Reminder Call:  Date: 11/14/2023 Status: Sch  Time: 1:40 PM Call dropped-called back and no answer

## 2023-11-14 ENCOUNTER — Encounter (INDEPENDENT_AMBULATORY_CARE_PROVIDER_SITE_OTHER): Payer: Self-pay

## 2023-11-14 ENCOUNTER — Ambulatory Visit (INDEPENDENT_AMBULATORY_CARE_PROVIDER_SITE_OTHER): Payer: Medicare PPO | Admitting: Otolaryngology

## 2023-11-14 VITALS — BP 123/81 | HR 94 | Ht 70.0 in | Wt 219.0 lb

## 2023-11-14 DIAGNOSIS — H9313 Tinnitus, bilateral: Secondary | ICD-10-CM | POA: Diagnosis not present

## 2023-11-14 DIAGNOSIS — T161XXA Foreign body in right ear, initial encounter: Secondary | ICD-10-CM | POA: Diagnosis not present

## 2023-11-14 DIAGNOSIS — H903 Sensorineural hearing loss, bilateral: Secondary | ICD-10-CM | POA: Diagnosis not present

## 2023-11-14 NOTE — Progress Notes (Signed)
 Patient ID: Justin Herrera, male   DOB: Apr 02, 1951, 73 y.o.   MRN: [id number]  Follow up: Asymmetric hearing loss, bilateral tinnitus  HPI: The patient is a 73 year old male who returns today for his follow-up evaluation.  The patient was previously seen for bilateral tinnitus and bilateral high-frequency sensorineural hearing loss, worse on the left side.  The patient was treated with bilateral hearing aids.  The patient returns today reporting no significant change in his symptoms.  He continues to have bilateral tinnitus.  His hearing has improved with the use of hearing aids. Currently he denies any otalgia, otorrhea, or vertigo.  Exam: General: Communicates without difficulty, well nourished, no acute distress. Head: Normocephalic, no evidence injury, no tenderness, facial buttresses intact without stepoff. Eyes: PERRL, EOMI. No scleral icterus, conjunctivae clear. Neuro: CN II exam reveals vision grossly intact. No nystagmus at any point of gaze. Ears: Auricles well formed without lesions.  A foreign body is noted to be impacted within the medial aspect of the right ear canal.  Both tympanic membranes and middle ear spaces are normal.  Nose: External evaluation reveals normal support and skin without lesions. Dorsum is intact. Anterior rhinoscopy reveals healthy pink mucosa over anterior aspect of inferior turbinates and intact septum. No purulence noted. Oral:  Oral cavity and oropharynx are intact, symmetric, without erythema or edema. Mucosa is moist without lesions. Neck: Full range of motion without pain. There is no significant lymphadenopathy. No masses palpable. Thyroid bed within normal limits to palpation. Parotid glands and submandibular glands equal bilaterally without mass. Trachea is midline. Neuro:  CN 2-12 grossly intact. Gait normal. Vestibular: No nystagmus at any point of gaze. The cerebellar examination is unremarkable.   Procedure: Right ear foreign body removal.    Anesthesia: None Description of the procedure: The patient is placed on a supine position.  Under the operating microscope, the right ear canal is examined.  A plastic hearing aid dome is noted to be impacted on the medial aspect of the ear canal.  Under the microscope, the foreign body is carefully removed with an alligator forceps and a 90 hook.  After foreign body removal, the tympanic membrane and middle ear space are noted to be normal.  The patient tolerated the procedure well.   Assessment:  1.  A foreign body is noted within the right ear canal.  After the foreign body removal procedure, the patient's ear canals, tympanic membranes and middle ear spaces are all normal.  2.  Subjectively stable bilateral high-frequency sensorineural hearing loss, with slight asymmetry on the left. 3. The patient's bilateral tinnitus is likely secondary to his sensorineural hearing loss.   Plan:  1.  Otomicroscopy with right ear foreign body removal. 2.  The physical exam findings are reviewed with the patient.  3.  Continue the use of his hearing aids. 4. The patient will follow up for re-evaluation in 12 months.

## 2023-11-16 DIAGNOSIS — H903 Sensorineural hearing loss, bilateral: Secondary | ICD-10-CM | POA: Insufficient documentation

## 2023-11-16 DIAGNOSIS — T161XXA Foreign body in right ear, initial encounter: Secondary | ICD-10-CM | POA: Insufficient documentation

## 2023-11-16 DIAGNOSIS — H9313 Tinnitus, bilateral: Secondary | ICD-10-CM | POA: Insufficient documentation

## 2023-11-20 DIAGNOSIS — M48062 Spinal stenosis, lumbar region with neurogenic claudication: Secondary | ICD-10-CM | POA: Diagnosis not present

## 2023-12-04 DIAGNOSIS — M48062 Spinal stenosis, lumbar region with neurogenic claudication: Secondary | ICD-10-CM | POA: Diagnosis not present

## 2023-12-31 ENCOUNTER — Other Ambulatory Visit: Payer: Self-pay | Admitting: Family Medicine

## 2023-12-31 DIAGNOSIS — E119 Type 2 diabetes mellitus without complications: Secondary | ICD-10-CM | POA: Diagnosis not present

## 2023-12-31 DIAGNOSIS — Z0100 Encounter for examination of eyes and vision without abnormal findings: Secondary | ICD-10-CM | POA: Diagnosis not present

## 2023-12-31 DIAGNOSIS — E11319 Type 2 diabetes mellitus with unspecified diabetic retinopathy without macular edema: Secondary | ICD-10-CM | POA: Diagnosis not present

## 2023-12-31 DIAGNOSIS — H33001 Unspecified retinal detachment with retinal break, right eye: Secondary | ICD-10-CM | POA: Diagnosis not present

## 2024-01-01 DIAGNOSIS — N1832 Chronic kidney disease, stage 3b: Secondary | ICD-10-CM | POA: Diagnosis not present

## 2024-01-07 DIAGNOSIS — I129 Hypertensive chronic kidney disease with stage 1 through stage 4 chronic kidney disease, or unspecified chronic kidney disease: Secondary | ICD-10-CM | POA: Diagnosis not present

## 2024-01-07 DIAGNOSIS — E1122 Type 2 diabetes mellitus with diabetic chronic kidney disease: Secondary | ICD-10-CM | POA: Diagnosis not present

## 2024-01-07 DIAGNOSIS — E785 Hyperlipidemia, unspecified: Secondary | ICD-10-CM | POA: Diagnosis not present

## 2024-01-07 DIAGNOSIS — N1832 Chronic kidney disease, stage 3b: Secondary | ICD-10-CM | POA: Diagnosis not present

## 2024-01-08 LAB — LAB REPORT - SCANNED
Albumin, Urine POC: 49.4
Creatinine, POC: 143.7 mg/dL
Microalb Creat Ratio: 34

## 2024-03-31 DIAGNOSIS — N1832 Chronic kidney disease, stage 3b: Secondary | ICD-10-CM | POA: Diagnosis not present

## 2024-04-08 DIAGNOSIS — I129 Hypertensive chronic kidney disease with stage 1 through stage 4 chronic kidney disease, or unspecified chronic kidney disease: Secondary | ICD-10-CM | POA: Diagnosis not present

## 2024-04-08 DIAGNOSIS — E1122 Type 2 diabetes mellitus with diabetic chronic kidney disease: Secondary | ICD-10-CM | POA: Diagnosis not present

## 2024-04-08 DIAGNOSIS — M1990 Unspecified osteoarthritis, unspecified site: Secondary | ICD-10-CM | POA: Diagnosis not present

## 2024-04-08 DIAGNOSIS — N1832 Chronic kidney disease, stage 3b: Secondary | ICD-10-CM | POA: Diagnosis not present

## 2024-04-08 DIAGNOSIS — E785 Hyperlipidemia, unspecified: Secondary | ICD-10-CM | POA: Diagnosis not present

## 2024-04-17 ENCOUNTER — Telehealth: Payer: Self-pay

## 2024-04-17 NOTE — Telephone Encounter (Signed)
 Unsuccessful attempts to reach patient on preferred number listed in notes for scheduled AWV. Left message on voicemail okay to reschedule.

## 2024-04-29 DIAGNOSIS — L821 Other seborrheic keratosis: Secondary | ICD-10-CM | POA: Diagnosis not present

## 2024-04-29 DIAGNOSIS — L570 Actinic keratosis: Secondary | ICD-10-CM | POA: Diagnosis not present

## 2024-04-29 DIAGNOSIS — L814 Other melanin hyperpigmentation: Secondary | ICD-10-CM | POA: Diagnosis not present

## 2024-04-29 DIAGNOSIS — D225 Melanocytic nevi of trunk: Secondary | ICD-10-CM | POA: Diagnosis not present

## 2024-04-30 ENCOUNTER — Encounter: Payer: Self-pay | Admitting: Cardiology

## 2024-05-18 ENCOUNTER — Other Ambulatory Visit: Payer: Self-pay | Admitting: Family Medicine

## 2024-05-19 ENCOUNTER — Other Ambulatory Visit: Payer: Self-pay | Admitting: Nurse Practitioner

## 2024-05-20 ENCOUNTER — Ambulatory Visit (INDEPENDENT_AMBULATORY_CARE_PROVIDER_SITE_OTHER)

## 2024-05-20 DIAGNOSIS — Z23 Encounter for immunization: Secondary | ICD-10-CM | POA: Diagnosis not present

## 2024-05-24 ENCOUNTER — Other Ambulatory Visit: Payer: Self-pay | Admitting: Family Medicine

## 2024-06-29 DIAGNOSIS — N1832 Chronic kidney disease, stage 3b: Secondary | ICD-10-CM | POA: Diagnosis not present

## 2024-07-09 DIAGNOSIS — I129 Hypertensive chronic kidney disease with stage 1 through stage 4 chronic kidney disease, or unspecified chronic kidney disease: Secondary | ICD-10-CM | POA: Diagnosis not present

## 2024-07-09 DIAGNOSIS — E669 Obesity, unspecified: Secondary | ICD-10-CM | POA: Diagnosis not present

## 2024-07-09 DIAGNOSIS — E1122 Type 2 diabetes mellitus with diabetic chronic kidney disease: Secondary | ICD-10-CM | POA: Diagnosis not present

## 2024-07-09 DIAGNOSIS — E785 Hyperlipidemia, unspecified: Secondary | ICD-10-CM | POA: Diagnosis not present

## 2024-07-09 DIAGNOSIS — N1832 Chronic kidney disease, stage 3b: Secondary | ICD-10-CM | POA: Diagnosis not present

## 2024-11-04 ENCOUNTER — Encounter: Admitting: Family Medicine
